# Patient Record
Sex: Female | Born: 1975
Health system: Southern US, Community
[De-identification: ages and names within clinical notes are randomized; demographics above are authoritative.]

## PROBLEM LIST (undated history)

## (undated) DIAGNOSIS — I5032 Chronic diastolic (congestive) heart failure: Secondary | ICD-10-CM

## (undated) DIAGNOSIS — I10 Essential (primary) hypertension: Secondary | ICD-10-CM

## (undated) DIAGNOSIS — E119 Type 2 diabetes mellitus without complications: Secondary | ICD-10-CM

## (undated) DIAGNOSIS — E785 Hyperlipidemia, unspecified: Secondary | ICD-10-CM

## (undated) DIAGNOSIS — D509 Iron deficiency anemia, unspecified: Secondary | ICD-10-CM

## (undated) DIAGNOSIS — I110 Hypertensive heart disease with heart failure: Secondary | ICD-10-CM

## (undated) DIAGNOSIS — N182 Chronic kidney disease, stage 2 (mild): Secondary | ICD-10-CM

## (undated) DIAGNOSIS — D573 Sickle-cell trait: Secondary | ICD-10-CM

## (undated) HISTORY — DX: Iron deficiency anemia, unspecified: D50.9

## (undated) HISTORY — DX: Morbid (severe) obesity due to excess calories: E66.01

## (undated) HISTORY — DX: Hyperlipidemia, unspecified: E78.5

## (undated) HISTORY — DX: Chronic diastolic (congestive) heart failure: I50.32

## (undated) HISTORY — DX: Chronic kidney disease, stage 2 (mild): N18.2

## (undated) HISTORY — DX: Sickle-cell trait: D57.3

## (undated) HISTORY — DX: Hypertensive heart disease with heart failure: I11.0

---

## 1998-02-13 ENCOUNTER — Emergency Department (HOSPITAL_COMMUNITY): Admission: EM | Admit: 1998-02-13 | Discharge: 1998-02-13 | Payer: Self-pay | Admitting: Emergency Medicine

## 1998-03-02 ENCOUNTER — Emergency Department (HOSPITAL_COMMUNITY): Admission: EM | Admit: 1998-03-02 | Discharge: 1998-03-02 | Payer: Self-pay | Admitting: Emergency Medicine

## 1998-03-13 ENCOUNTER — Emergency Department (HOSPITAL_COMMUNITY): Admission: EM | Admit: 1998-03-13 | Discharge: 1998-03-13 | Payer: Self-pay | Admitting: Emergency Medicine

## 1998-09-19 ENCOUNTER — Encounter: Admission: RE | Admit: 1998-09-19 | Discharge: 1998-09-19 | Payer: Self-pay | Admitting: Internal Medicine

## 1998-10-05 ENCOUNTER — Ambulatory Visit (HOSPITAL_BASED_OUTPATIENT_CLINIC_OR_DEPARTMENT_OTHER): Admission: RE | Admit: 1998-10-05 | Discharge: 1998-10-05 | Payer: Self-pay | Admitting: General Surgery

## 1998-10-11 ENCOUNTER — Emergency Department (HOSPITAL_COMMUNITY): Admission: EM | Admit: 1998-10-11 | Discharge: 1998-10-11 | Payer: Self-pay | Admitting: Emergency Medicine

## 1998-10-11 ENCOUNTER — Encounter: Payer: Self-pay | Admitting: Emergency Medicine

## 1999-04-12 ENCOUNTER — Emergency Department (HOSPITAL_COMMUNITY): Admission: EM | Admit: 1999-04-12 | Discharge: 1999-04-12 | Payer: Self-pay | Admitting: Emergency Medicine

## 1999-05-04 ENCOUNTER — Encounter (HOSPITAL_BASED_OUTPATIENT_CLINIC_OR_DEPARTMENT_OTHER): Payer: Self-pay | Admitting: General Surgery

## 1999-05-08 ENCOUNTER — Ambulatory Visit (HOSPITAL_COMMUNITY): Admission: RE | Admit: 1999-05-08 | Discharge: 1999-05-08 | Payer: Self-pay | Admitting: General Surgery

## 1999-05-16 ENCOUNTER — Encounter: Admission: RE | Admit: 1999-05-16 | Discharge: 1999-05-16 | Payer: Self-pay | Admitting: Internal Medicine

## 1999-06-19 ENCOUNTER — Encounter: Admission: RE | Admit: 1999-06-19 | Discharge: 1999-09-17 | Payer: Self-pay | Admitting: *Deleted

## 1999-07-12 ENCOUNTER — Other Ambulatory Visit: Admission: RE | Admit: 1999-07-12 | Discharge: 1999-07-12 | Payer: Self-pay | Admitting: Obstetrics

## 1999-07-13 ENCOUNTER — Encounter: Admission: RE | Admit: 1999-07-13 | Discharge: 1999-07-13 | Payer: Self-pay | Admitting: Hematology and Oncology

## 2000-08-14 ENCOUNTER — Encounter: Admission: RE | Admit: 2000-08-14 | Discharge: 2000-08-14 | Payer: Self-pay | Admitting: Hematology and Oncology

## 2000-10-09 ENCOUNTER — Encounter: Admission: RE | Admit: 2000-10-09 | Discharge: 2000-10-09 | Payer: Self-pay | Admitting: Hematology and Oncology

## 2000-10-23 ENCOUNTER — Encounter: Admission: RE | Admit: 2000-10-23 | Discharge: 2001-01-21 | Payer: Self-pay | Admitting: *Deleted

## 2000-11-14 ENCOUNTER — Encounter: Payer: Self-pay | Admitting: Emergency Medicine

## 2000-11-14 ENCOUNTER — Emergency Department (HOSPITAL_COMMUNITY): Admission: EM | Admit: 2000-11-14 | Discharge: 2000-11-14 | Payer: Self-pay | Admitting: Emergency Medicine

## 2000-12-11 ENCOUNTER — Other Ambulatory Visit: Admission: RE | Admit: 2000-12-11 | Discharge: 2000-12-11 | Payer: Self-pay | Admitting: Obstetrics

## 2000-12-23 ENCOUNTER — Emergency Department (HOSPITAL_COMMUNITY): Admission: EM | Admit: 2000-12-23 | Discharge: 2000-12-23 | Payer: Self-pay | Admitting: Emergency Medicine

## 2001-03-18 ENCOUNTER — Encounter: Admission: RE | Admit: 2001-03-18 | Discharge: 2001-03-18 | Payer: Self-pay | Admitting: Internal Medicine

## 2001-05-14 ENCOUNTER — Encounter: Admission: RE | Admit: 2001-05-14 | Discharge: 2001-05-14 | Payer: Self-pay | Admitting: Internal Medicine

## 2001-08-20 ENCOUNTER — Encounter: Admission: RE | Admit: 2001-08-20 | Discharge: 2001-08-20 | Payer: Self-pay | Admitting: Internal Medicine

## 2001-08-20 ENCOUNTER — Encounter: Payer: Self-pay | Admitting: Internal Medicine

## 2001-08-20 ENCOUNTER — Ambulatory Visit (HOSPITAL_COMMUNITY): Admission: RE | Admit: 2001-08-20 | Discharge: 2001-08-20 | Payer: Self-pay | Admitting: Internal Medicine

## 2001-09-15 ENCOUNTER — Encounter: Admission: RE | Admit: 2001-09-15 | Discharge: 2001-09-15 | Payer: Self-pay

## 2001-11-16 ENCOUNTER — Encounter: Payer: Self-pay | Admitting: Emergency Medicine

## 2001-11-16 ENCOUNTER — Emergency Department (HOSPITAL_COMMUNITY): Admission: EM | Admit: 2001-11-16 | Discharge: 2001-11-16 | Payer: Self-pay | Admitting: Emergency Medicine

## 2002-01-05 ENCOUNTER — Encounter: Admission: RE | Admit: 2002-01-05 | Discharge: 2002-01-05 | Payer: Self-pay

## 2002-01-05 ENCOUNTER — Ambulatory Visit (HOSPITAL_COMMUNITY): Admission: RE | Admit: 2002-01-05 | Discharge: 2002-01-05 | Payer: Self-pay

## 2002-01-05 ENCOUNTER — Encounter: Payer: Self-pay | Admitting: Internal Medicine

## 2002-01-14 ENCOUNTER — Encounter: Admission: RE | Admit: 2002-01-14 | Discharge: 2002-01-14 | Payer: Self-pay | Admitting: Internal Medicine

## 2003-02-21 ENCOUNTER — Emergency Department (HOSPITAL_COMMUNITY): Admission: EM | Admit: 2003-02-21 | Discharge: 2003-02-22 | Payer: Self-pay | Admitting: Emergency Medicine

## 2003-02-22 ENCOUNTER — Encounter: Payer: Self-pay | Admitting: Emergency Medicine

## 2003-11-22 ENCOUNTER — Emergency Department (HOSPITAL_COMMUNITY): Admission: AD | Admit: 2003-11-22 | Discharge: 2003-11-22 | Payer: Self-pay | Admitting: Family Medicine

## 2004-03-21 ENCOUNTER — Emergency Department (HOSPITAL_COMMUNITY): Admission: EM | Admit: 2004-03-21 | Discharge: 2004-03-21 | Payer: Self-pay | Admitting: Emergency Medicine

## 2004-08-22 ENCOUNTER — Emergency Department (HOSPITAL_COMMUNITY): Admission: EM | Admit: 2004-08-22 | Discharge: 2004-08-22 | Payer: Self-pay | Admitting: Family Medicine

## 2004-10-14 ENCOUNTER — Emergency Department (HOSPITAL_COMMUNITY): Admission: EM | Admit: 2004-10-14 | Discharge: 2004-10-14 | Payer: Self-pay | Admitting: Family Medicine

## 2010-05-23 ENCOUNTER — Inpatient Hospital Stay (HOSPITAL_COMMUNITY): Admission: EM | Admit: 2010-05-23 | Discharge: 2010-05-26 | Payer: Self-pay | Admitting: Emergency Medicine

## 2010-11-23 LAB — GLUCOSE, CAPILLARY
Glucose-Capillary: 338 mg/dL — ABNORMAL HIGH (ref 70–99)
Glucose-Capillary: 110 mg/dL — ABNORMAL HIGH (ref 70–99)
Glucose-Capillary: 148 mg/dL — ABNORMAL HIGH (ref 70–99)
Glucose-Capillary: 156 mg/dL — ABNORMAL HIGH (ref 70–99)
Glucose-Capillary: 161 mg/dL — ABNORMAL HIGH (ref 70–99)
Glucose-Capillary: 185 mg/dL — ABNORMAL HIGH (ref 70–99)
Glucose-Capillary: 190 mg/dL — ABNORMAL HIGH (ref 70–99)
Glucose-Capillary: 214 mg/dL — ABNORMAL HIGH (ref 70–99)
Glucose-Capillary: 220 mg/dL — ABNORMAL HIGH (ref 70–99)
Glucose-Capillary: 265 mg/dL — ABNORMAL HIGH (ref 70–99)
Glucose-Capillary: 276 mg/dL — ABNORMAL HIGH (ref 70–99)
Glucose-Capillary: 278 mg/dL — ABNORMAL HIGH (ref 70–99)
Glucose-Capillary: 330 mg/dL — ABNORMAL HIGH (ref 70–99)
Glucose-Capillary: 346 mg/dL — ABNORMAL HIGH (ref 70–99)
Glucose-Capillary: 411 mg/dL — ABNORMAL HIGH (ref 70–99)
Glucose-Capillary: 420 mg/dL — ABNORMAL HIGH (ref 70–99)

## 2010-11-23 LAB — URINALYSIS, ROUTINE W REFLEX MICROSCOPIC

## 2010-11-23 LAB — CBC
HCT: 32.5 % — ABNORMAL LOW (ref 36.0–46.0)
Hemoglobin: 11.2 g/dL — ABNORMAL LOW (ref 12.0–15.0)
MCH: 23.3 pg — ABNORMAL LOW (ref 26.0–34.0)
MCH: 23.5 pg — ABNORMAL LOW (ref 26.0–34.0)
MCH: 23.9 pg — ABNORMAL LOW (ref 26.0–34.0)
MCH: 24.7 pg — ABNORMAL LOW (ref 26.0–34.0)
MCHC: 32.7 g/dL (ref 30.0–36.0)
MCHC: 32.9 g/dL (ref 30.0–36.0)
MCHC: 34.5 g/dL (ref 30.0–36.0)
MCV: 71.3 fL — ABNORMAL LOW (ref 78.0–100.0)
MCV: 71.6 fL — ABNORMAL LOW (ref 78.0–100.0)
Platelets: 182 10*3/uL (ref 150–400)
Platelets: 190 10*3/uL (ref 150–400)
Platelets: 165 K/uL (ref 150–400)
Platelets: 197 10*3/uL (ref 150–400)
RBC: 4.45 MIL/uL (ref 3.87–5.11)
RBC: 4.49 MIL/uL (ref 3.87–5.11)
RBC: 4.54 MIL/uL (ref 3.87–5.11)
RBC: 4.93 MIL/uL (ref 3.87–5.11)
RDW: 17.2 % — ABNORMAL HIGH (ref 11.5–15.5)
RDW: 17.3 % — ABNORMAL HIGH (ref 11.5–15.5)
RDW: 15.7 % — ABNORMAL HIGH (ref 11.5–15.5)
RDW: 16.5 % — ABNORMAL HIGH (ref 11.5–15.5)
WBC: 20.1 K/uL — ABNORMAL HIGH (ref 4.0–10.5)

## 2010-11-23 LAB — DIFFERENTIAL
Basophils Absolute: 0 10*3/uL (ref 0.0–0.1)
Basophils Absolute: 0 10*3/uL (ref 0.0–0.1)
Basophils Absolute: 0 K/uL (ref 0.0–0.1)
Basophils Relative: 0 % (ref 0–1)
Basophils Relative: 0 % (ref 0–1)
Eosinophils Absolute: 0.1 10*3/uL (ref 0.0–0.7)
Eosinophils Absolute: 0 10*3/uL (ref 0.0–0.7)
Eosinophils Absolute: 0 K/uL (ref 0.0–0.7)
Eosinophils Relative: 0 % (ref 0–5)
Eosinophils Relative: 0 % (ref 0–5)
Lymphocytes Relative: 10 % — ABNORMAL LOW (ref 12–46)
Lymphocytes Relative: 15 % (ref 12–46)
Lymphs Abs: 4.4 10*3/uL — ABNORMAL HIGH (ref 0.7–4.0)
Lymphs Abs: 3 K/uL (ref 0.7–4.0)
Monocytes Absolute: 1.4 10*3/uL — ABNORMAL HIGH (ref 0.1–1.0)
Monocytes Absolute: 1.8 K/uL — ABNORMAL HIGH (ref 0.1–1.0)
Monocytes Relative: 9 % (ref 3–12)
Neutro Abs: 5.1 10*3/uL (ref 1.7–7.7)
Neutro Abs: 15.3 K/uL — ABNORMAL HIGH (ref 1.7–7.7)
Neutro Abs: 20 10*3/uL — ABNORMAL HIGH (ref 1.7–7.7)
Neutrophils Relative %: 47 % (ref 43–77)
Neutrophils Relative %: 76 % (ref 43–77)
Neutrophils Relative %: 84 % — ABNORMAL HIGH (ref 43–77)

## 2010-11-23 LAB — COMPREHENSIVE METABOLIC PANEL
Alkaline Phosphatase: 82 U/L (ref 39–117)
BUN: 8 mg/dL (ref 6–23)
Chloride: 109 mEq/L (ref 96–112)
Creatinine, Ser: 0.73 mg/dL (ref 0.4–1.2)
GFR calc non Af Amer: >60 mL/min (ref >60)
Glucose, Bld: 108 mg/dL — ABNORMAL HIGH (ref 70–99)
Potassium: 3.2 mEq/L — ABNORMAL LOW (ref 3.5–5.1)
Total Bilirubin: 0.4 mg/dL (ref 0.3–1.2)

## 2010-11-23 LAB — HEMOGLOBIN A1C
Hgb A1c MFr Bld: 10.3 % — ABNORMAL HIGH (ref <5.7)
Mean Plasma Glucose: 249 mg/dL — ABNORMAL HIGH (ref <117)

## 2010-11-23 LAB — BASIC METABOLIC PANEL
BUN: 6 mg/dL (ref 6–23)
BUN: 8 mg/dL (ref 6–23)
CO2: 27 mEq/L (ref 19–32)
CO2: 26 mEq/L (ref 19–32)
Calcium: 8.5 mg/dL (ref 8.4–10.5)
Calcium: 8.7 mg/dL (ref 8.4–10.5)
Calcium: 8.8 mg/dL (ref 8.4–10.5)
Chloride: 106 mEq/L (ref 96–112)
Chloride: 107 mEq/L (ref 96–112)
Creatinine, Ser: 0.62 mg/dL (ref 0.4–1.2)
Creatinine, Ser: 0.81 mg/dL (ref 0.4–1.2)
GFR calc non Af Amer: >60 mL/min (ref >60)
GFR calc non Af Amer: >60 mL/min (ref >60)
GFR calc non Af Amer: >60 mL/min (ref >60)
Glucose, Bld: 259 mg/dL — ABNORMAL HIGH (ref 70–99)
Glucose, Bld: 310 mg/dL — ABNORMAL HIGH (ref 70–99)
Potassium: 3.4 mEq/L — ABNORMAL LOW (ref 3.5–5.1)
Potassium: 4 mEq/L (ref 3.5–5.1)
Potassium: 3.9 mEq/L (ref 3.5–5.1)
Sodium: 139 mEq/L (ref 135–145)
Sodium: 140 mEq/L (ref 135–145)

## 2010-11-23 LAB — LIPID PANEL
Cholesterol: 123 mg/dL (ref 0–200)
HDL: 32 mg/dL — ABNORMAL LOW (ref >39)
LDL Cholesterol: 73 mg/dL (ref 0–99)
Total CHOL/HDL Ratio: 3.8 ratio
Triglycerides: 91 mg/dL (ref <150)
VLDL: 18 mg/dL (ref 0–40)

## 2010-11-23 LAB — CULTURE, BLOOD (ROUTINE X 2)
Culture  Setup Time: 201109140828
Culture  Setup Time: 201109140828
Culture: NO GROWTH

## 2010-11-23 LAB — URINE MICROSCOPIC-ADD ON

## 2010-11-23 LAB — PREGNANCY, URINE: Preg Test, Ur: NEGATIVE

## 2010-11-23 LAB — POCT I-STAT, CHEM 8
BUN: 10 mg/dL (ref 6–23)
Calcium, Ion: 1.17 mmol/L (ref 1.12–1.32)
Chloride: 99 mEq/L (ref 96–112)
Creatinine, Ser: 0.7 mg/dL (ref 0.4–1.2)
Potassium: 3.7 mEq/L (ref 3.5–5.1)

## 2010-11-23 LAB — URINE CULTURE: Colony Count: >=100000

## 2011-08-27 ENCOUNTER — Ambulatory Visit: Payer: Self-pay

## 2011-08-27 DIAGNOSIS — IMO0001 Reserved for inherently not codable concepts without codable children: Secondary | ICD-10-CM

## 2014-01-27 ENCOUNTER — Emergency Department (HOSPITAL_COMMUNITY)
Admission: EM | Admit: 2014-01-27 | Discharge: 2014-01-27 | Disposition: A | Discharge status: Home / Self Care | Payer: Self-pay | Attending: Emergency Medicine | Admitting: Emergency Medicine

## 2014-01-27 ENCOUNTER — Encounter (HOSPITAL_COMMUNITY): Payer: Self-pay | Admitting: Emergency Medicine

## 2014-01-27 DIAGNOSIS — Z9119 Patient's noncompliance with other medical treatment and regimen: Secondary | ICD-10-CM | POA: Insufficient documentation

## 2014-01-27 DIAGNOSIS — I10 Essential (primary) hypertension: Secondary | ICD-10-CM | POA: Insufficient documentation

## 2014-01-27 DIAGNOSIS — E119 Type 2 diabetes mellitus without complications: Secondary | ICD-10-CM | POA: Insufficient documentation

## 2014-01-27 DIAGNOSIS — Z91199 Patient's noncompliance with other medical treatment and regimen due to unspecified reason: Secondary | ICD-10-CM | POA: Insufficient documentation

## 2014-01-27 DIAGNOSIS — H538 Other visual disturbances: Secondary | ICD-10-CM | POA: Insufficient documentation

## 2014-01-27 HISTORY — DX: Essential (primary) hypertension: I10

## 2014-01-27 HISTORY — DX: Type 2 diabetes mellitus without complications: E11.9

## 2014-01-27 LAB — BASIC METABOLIC PANEL
BUN: 11 mg/dL (ref 6–23)
CALCIUM: 9.5 mg/dL (ref 8.4–10.5)
CO2: 21 mEq/L (ref 19–32)
Chloride: 97 mEq/L (ref 96–112)
Creatinine, Ser: 0.87 mg/dL (ref 0.50–1.10)
GFR calc Af Amer: >90 mL/min (ref >90)
GFR, EST NON AFRICAN AMERICAN: 83 mL/min — AB (ref >90)
Glucose, Bld: 294 mg/dL — ABNORMAL HIGH (ref 70–99)
Potassium: 4.4 mEq/L (ref 3.7–5.3)
Sodium: 135 mEq/L — ABNORMAL LOW (ref 137–147)

## 2014-01-27 LAB — CBC WITH DIFFERENTIAL/PLATELET
BASOS ABS: 0 10*3/uL (ref 0.0–0.1)
Basophils Relative: 0 % (ref 0–1)
EOS PCT: 1 % (ref 0–5)
Eosinophils Absolute: 0.1 10*3/uL (ref 0.0–0.7)
HCT: 37 % (ref 36.0–46.0)
Hemoglobin: 12.3 g/dL (ref 12.0–15.0)
LYMPHS ABS: 4.4 10*3/uL — AB (ref 0.7–4.0)
LYMPHS PCT: 33 % (ref 12–46)
MCH: 23.1 pg — ABNORMAL LOW (ref 26.0–34.0)
MCHC: 33.2 g/dL (ref 30.0–36.0)
MCV: 69.4 fL — AB (ref 78.0–100.0)
MONOS PCT: 5 % (ref 3–12)
Monocytes Absolute: 0.7 10*3/uL (ref 0.1–1.0)
NEUTROS PCT: 61 % (ref 43–77)
Neutro Abs: 8.1 10*3/uL — ABNORMAL HIGH (ref 1.7–7.7)
PLATELETS: 213 10*3/uL (ref 150–400)
RBC: 5.33 MIL/uL — AB (ref 3.87–5.11)
RDW: 16.1 % — AB (ref 11.5–15.5)
WBC: 13.3 10*3/uL — AB (ref 4.0–10.5)

## 2014-01-27 LAB — CBG MONITORING, ED: Glucose-Capillary: 257 mg/dL — ABNORMAL HIGH (ref 70–99)

## 2014-01-27 LAB — URINE MICROSCOPIC-ADD ON

## 2014-01-27 LAB — URINALYSIS, ROUTINE W REFLEX MICROSCOPIC
BILIRUBIN URINE: NEGATIVE
Glucose, UA: 500 mg/dL — AB
KETONES UR: NEGATIVE mg/dL
Leukocytes, UA: NEGATIVE
Nitrite: NEGATIVE
Specific Gravity, Urine: 1.012 (ref 1.005–1.030)
Urobilinogen, UA: 0.2 mg/dL (ref 0.0–1.0)
pH: 7 (ref 5.0–8.0)

## 2014-01-27 MED ORDER — HYDROCHLOROTHIAZIDE 25 MG PO TABS
25.0000 mg | ORAL_TABLET | Freq: Once | ORAL | Status: AC
  Administered 2014-01-27: 25 mg via ORAL
  Filled 2014-01-27: qty 1

## 2014-01-27 MED ORDER — METFORMIN HCL 500 MG PO TABS: 500.0000 mg | ORAL_TABLET | Freq: Two times a day (BID) | ORAL | Status: DC

## 2014-01-27 MED ORDER — LISINOPRIL 10 MG PO TABS
10.0000 mg | ORAL_TABLET | Freq: Once | ORAL | Status: AC
  Administered 2014-01-27: 10 mg via ORAL
  Filled 2014-01-27: qty 1

## 2014-01-27 MED ORDER — HYDROCHLOROTHIAZIDE 25 MG PO TABS: 25.0000 mg | ORAL_TABLET | Freq: Once | ORAL | Status: DC

## 2014-01-27 MED ORDER — LISINOPRIL 10 MG PO TABS: 10.0000 mg | ORAL_TABLET | Freq: Once | ORAL | Status: DC

## 2014-01-27 MED ORDER — HYDROCHLOROTHIAZIDE 12.5 MG PO CAPS
25.0000 mg | ORAL_CAPSULE | Freq: Once | ORAL | Status: DC
  Filled 2014-01-27 (×2): qty 2

## 2014-01-27 NOTE — ED Notes (Signed)
Pt states that she is diabetic but has not had her medicine in a year.  Would like her sugar checked.  Also c/o a nontender knot on her scalp x 2 days.

## 2014-01-27 NOTE — ED Notes (Signed)
Pt has a small knot on her head that she states popped up a couple of weeks ago. Pt denies any pain from the knot. PA aware.

## 2014-01-27 NOTE — Discharge Instructions (Signed)
Please read and follow all provided instructions.  Your diagnoses today include:  1. Hypertension   2. Diabetes mellitus     Your blood pressure was high today (BP): BP 189/105   Pulse 126   Temp(Src) 98.1 F (36.7 C) (Oral)   Resp 20   SpO2 100%   LMP 01/25/2014  Tests performed today include:  Vital signs. See below for your results today.   Medications prescribed:   HCTZ - medication for blood pressure   Lisinopril - medication for blood pressure   Metformin - medication for blood sugar  Home care instructions:  Follow any educational materials contained in this packet.  Follow-up instructions: Please follow-up with your primary care provider in the next 3 days for a recheck of your symptoms and blood pressure.    If you do not have a primary care doctor -- see below for referral information.   Return instructions:   Please return to the Emergency Department if you experience worsening symptoms.   Return with severe chest pain, abdominal pain, or shortness of breath.   Return with severe headache, focal weakness, numbness, difficulty with speech or vision.  Return with loss of vision or sudden vision change.  Please return if you have any other emergent concerns.  Additional Information:  Your vital signs today were: BP 189/105   Pulse 126   Temp(Src) 98.1 F (36.7 C) (Oral)   Resp 20   SpO2 100%   LMP 01/25/2014 If your blood pressure (BP) was elevated above 135/85 this visit, please have this repeated by your doctor within one month. --------------

## 2014-01-27 NOTE — ED Provider Notes (Signed)
Medical screening examination/treatment/procedure(s) were performed by non-physician practitioner and as supervising physician I was immediately available for consultation/collaboration.   EKG Interpretation None       Kristen N Ward, DO 01/27/14 2325 

## 2014-01-27 NOTE — ED Provider Notes (Signed)
CSN: [id number]     Arrival date & time 01/27/14  1731 History   First MD Initiated Contact with Patient 01/27/14 1823     Chief Complaint  Patient presents with  . Headache     (Consider location/radiation/quality/duration/timing/severity/associated sxs/prior Treatment) HPI Comments: Patient with history of hypertension and diabetes, noncompliant with her medications in over one year -- presents with several day history of "feeling out of sorts". She is unable to describe exactly how she is feeling. She denies headache. Patient denies signs of stroke including: facial droop, slurred speech, aphasia, weakness/numbness in extremities, imbalance/trouble walking. Patient has had visual disturbance and left lateral field at times however this is not currently present. She does not describe this as a loss of vision. She denies chest pain or shortness of breath. She denies change in urination. No treatments prior to arrival.  Patient used to take HCTZ, lisinopril, and amlodipine for blood pressure. She is to take metformin and glipizide for diabetes.  Patient also presents with complaint of a nontender "knot" of her left scalp for the past 2 weeks. No drainage or warmth.   The history is provided by the patient.    Past Medical History  Diagnosis Date  . Diabetes mellitus without complication   . Hypertension    History reviewed. No pertinent past surgical history. History reviewed. No pertinent family history. History  Substance Use Topics  . Smoking status: Never Smoker   . Smokeless tobacco: Not on file  . Alcohol Use: No   OB History   Grav Para Term Preterm Abortions TAB SAB Ect Mult Living                 Review of Systems  Constitutional: Negative for fever.  HENT: Negative for rhinorrhea and sore throat.   Eyes: Positive for visual disturbance (intermittent). Negative for photophobia and redness.  Respiratory: Negative for cough and shortness of breath.   Cardiovascular:  Negative for chest pain.  Gastrointestinal: Negative for nausea, vomiting, abdominal pain and diarrhea.  Genitourinary: Negative for dysuria.  Musculoskeletal: Negative for myalgias.  Skin: Negative for rash.       Knot on scalp.   Neurological: Negative for headaches.      Allergies  Review of patient's allergies indicates no known allergies.  Home Medications   Prior to Admission medications   Not on File   BP 240/142  Pulse 126  Temp(Src) 98.1 F (36.7 C) (Oral)  Resp 18  SpO2 100%  LMP 01/25/2014 Physical Exam  Nursing note and vitals reviewed. Constitutional: She is oriented to person, place, and time. She appears well-developed and well-nourished.  HENT:  Head: Normocephalic and atraumatic.  Right Ear: Tympanic membrane, external ear and ear canal normal.  Left Ear: Tympanic membrane, external ear and ear canal normal.  Nose: Nose normal.  Mouth/Throat: Uvula is midline, oropharynx is clear and moist and mucous membranes are normal.  Patient with 1 cm nodule of, left upper scalp consistent with cyst, likely a sebaceous cyst. No associated infection. Area is nontender  Eyes: Conjunctivae, EOM and lids are normal. Pupils are equal, round, and reactive to light. Right eye exhibits no discharge. Left eye exhibits no discharge. Right eye exhibits no nystagmus. Left eye exhibits no nystagmus.  Fundoscopic exam:      The right eye shows no papilledema.       The left eye shows no papilledema.  Neck: Normal range of motion. Neck supple.  Cardiovascular: Normal rate, regular rhythm and normal  heart sounds.   Pulmonary/Chest: Effort normal and breath sounds normal. No respiratory distress. She has no wheezes. She has no rales.  Abdominal: Soft. There is no tenderness.  Musculoskeletal:       Cervical back: She exhibits normal range of motion, no tenderness and no bony tenderness.  Neurological: She is alert and oriented to person, place, and time. She has normal strength and  normal reflexes. No cranial nerve deficit or sensory deficit. She displays a negative Romberg sign. Coordination and gait normal. GCS eye subscore is 4. GCS verbal subscore is 5. GCS motor subscore is 6.  Skin: Skin is warm and dry.  Psychiatric: She has a normal mood and affect.    ED Course  Procedures (including critical care time) Labs Review Labs Reviewed  CBC WITH DIFFERENTIAL - Abnormal; Notable for the following:    WBC 13.3 (*)    RBC 5.33 (*)    MCV 69.4 (*)    MCH 23.1 (*)    RDW 16.1 (*)    Neutro Abs 8.1 (*)    Lymphs Abs 4.4 (*)    All other components within normal limits  BASIC METABOLIC PANEL - Abnormal; Notable for the following:    Sodium 135 (*)    Glucose, Bld 294 (*)    GFR calc non Af Amer 83 (*)    All other components within normal limits  URINALYSIS, ROUTINE W REFLEX MICROSCOPIC - Abnormal; Notable for the following:    Glucose, UA 500 (*)    Hgb urine dipstick SMALL (*)    Protein, ur >300 (*)    All other components within normal limits  CBG MONITORING, ED - Abnormal; Notable for the following:    Glucose-Capillary 257 (*)    All other components within normal limits  URINE MICROSCOPIC-ADD ON    Imaging Review No results found.   EKG Interpretation None      6:29 PM Patient seen and examined. Work-up initiated. Medications ordered. D/w Dr. Elesa MassedWard.   Vital signs reviewed and are as follows: Filed Vitals:   01/27/14 1822  BP: 240/142  Pulse: 126  Temp: 98.1 F (36.7 C)  Resp: 18   8:29 PM BP improved after PO meds. Patient is stable. Results reviewed with patient including glucose, protein in urine, normal creatinine.   BP 189/105  Pulse 126  Temp(Src) 98.1 F (36.7 C) (Oral)  Resp 20  SpO2 100%  LMP 01/25/2014  HR was 95 on monitor when I went to tell her her results and did not go above 105.   She will be discharged to home. Told to f/u with [facility].   She is to restart lisinopril and HCTZ, metformin.  Her PCP will titrate these medications.   Patient counseled to return if they have weakness in their arms or legs, slurred speech, trouble walking or talking, confusion, trouble with their balance, or if they have any other concerns. Patient verbalizes understanding and agrees with plan. Told to return with vision change/loss, CP, SOB.     MDM   Final diagnoses:  Hypertension  Diabetes mellitus   HTN: no signs of end organ damage. BP improved into 180's. HR improved -- suspect element of anxiety given patient appearance. Good response from PO meds. Will restart and have patient f/u for titration. Protein in urine is not great prognostic factor.   DM: hyperglycemia without ketosis. Restart metformin. No further treatment at this time. Again, pt to f/u for management.  No dangerous or life-threatening conditions suspected or identified by history, physical exam, and by work-up. No indications for hospitalization identified.      Renne Crigler, PA-C 01/27/14 2034

## 2014-01-28 NOTE — Progress Notes (Signed)
Received phone call from Dois DavenportSandra, Database administratorpharmacist tech, at [facility] and she requested discharge medication prescription clarification. Provided an EPIC read. No further questions or concerns. Ferdinand CavaAndrea Schettino, RN, BSN, Case Managers 01/28/2014 10:28 AM

## 2014-03-29 ENCOUNTER — Ambulatory Visit (INDEPENDENT_AMBULATORY_CARE_PROVIDER_SITE_OTHER): Payer: Self-pay | Admitting: Family Medicine

## 2014-03-29 VITALS — BP 180/110 | HR 116 | Temp 98.1°F | Resp 16 | Ht 63.5 in | Wt 295.0 lb

## 2014-03-29 DIAGNOSIS — E1165 Type 2 diabetes mellitus with hyperglycemia: Secondary | ICD-10-CM

## 2014-03-29 DIAGNOSIS — E119 Type 2 diabetes mellitus without complications: Secondary | ICD-10-CM

## 2014-03-29 DIAGNOSIS — I10 Essential (primary) hypertension: Secondary | ICD-10-CM

## 2014-03-29 LAB — COMPREHENSIVE METABOLIC PANEL
ALT: 8 U/L (ref 0–35)
AST: 9 U/L (ref 0–37)
Albumin: 3.4 g/dL — ABNORMAL LOW (ref 3.5–5.2)
Alkaline Phosphatase: 79 U/L (ref 39–117)
BILIRUBIN TOTAL: 0.3 mg/dL (ref 0.2–1.2)
BUN: 18 mg/dL (ref 6–23)
CHLORIDE: 102 meq/L (ref 96–112)
CO2: 25 mEq/L (ref 19–32)
Calcium: 9.1 mg/dL (ref 8.4–10.5)
Creat: 0.96 mg/dL (ref 0.50–1.10)
Glucose, Bld: 322 mg/dL — ABNORMAL HIGH (ref 70–99)
Potassium: 4.3 mEq/L (ref 3.5–5.3)
SODIUM: 138 meq/L (ref 135–145)
TOTAL PROTEIN: 6.9 g/dL (ref 6.0–8.3)

## 2014-03-29 LAB — GLUCOSE, POCT (MANUAL RESULT ENTRY): POC Glucose: 315 mg/dl — AB (ref 70–99)

## 2014-03-29 LAB — POCT GLYCOSYLATED HEMOGLOBIN (HGB A1C): HEMOGLOBIN A1C: 11.1

## 2014-03-29 MED ORDER — METFORMIN HCL 1000 MG PO TABS: 1000.0000 mg | ORAL_TABLET | Freq: Two times a day (BID) | ORAL | Status: DC

## 2014-03-29 MED ORDER — HYDROCHLOROTHIAZIDE 25 MG PO TABS: 25.0000 mg | ORAL_TABLET | Freq: Once | ORAL | Status: DC

## 2014-03-29 MED ORDER — LISINOPRIL 20 MG PO TABS: 20.0000 mg | ORAL_TABLET | Freq: Every day | ORAL | Status: DC

## 2014-03-29 NOTE — Patient Instructions (Signed)
Increase lisinopril to 20 mg by mouth daily- can take two 10 mg tablets until you run out Please check your blood pressure daily  Continue to make good food choices- no juice or soda, drink unsweetened tea, water, increase lean protein and vegetable intake. Drink 8 glasses of water a day Move more- even 10 minutes of walking a day will decrease your blood sugar. Try to exercise at least every other day to help lower blood sugar.

## 2014-03-29 NOTE — Progress Notes (Signed)
   Subjective:    Patient ID: Brittany Lopez, female    DOB: 06/12/1976, 38 y.o.   MRN: [id number]  HPI This is a very pleasant 38 yo female who is accompanied by her 619 yo daughter today. The patient presents today for follow up of HTN/DM. Was seen in ER 5/15 and restarted on glucophage/lisinopril. Prior to that, was not taking any medication. She admits to trying to manage her HTN/DM "on my own," without medications, but has realized that is not feasible at her current weight. She has been out of her medication for about a week.  She saw Dr. Merla Richesoolittle in the past, but has not had regular care in quite awhile. She does not have health insurance. She is currently participating in a "wellness program," and is trying to eat better. She has an automatic blood pressure cuff at home and checks her BP regularly. She reports systolic readings in the 150s and diastolic readings in the 80s. She did not take her medication this morning.   She has a glucometer at home, but has not been checking her blood sugar.  Review of Systems No cp, no SOB, no urinary frequency, no polydipsia, no polypahgia     Objective:   Physical Exam  Vitals reviewed. Constitutional: She is oriented to person, place, and time. She appears well-developed and well-nourished.  HENT:  Head: Normocephalic and atraumatic.  Right Ear: External ear normal.  Left Ear: External ear normal.  Nose: Nose normal.  Mouth/Throat: Oropharynx is clear and moist.  Eyes: Conjunctivae are normal. Right eye exhibits no discharge. Left eye exhibits no discharge.  Neck: Normal range of motion. Neck supple.  Cardiovascular: Normal rate, regular rhythm and normal heart sounds.   Pulmonary/Chest: Effort normal and breath sounds normal.  Musculoskeletal: Normal range of motion.  Neurological: She is alert and oriented to person, place, and time.  Skin: Skin is warm and dry.  Psychiatric: She has a normal mood and affect. Her behavior is normal.  Judgment and thought content normal.      Assessment & Plan:  1. Essential hypertension - Comprehensive metabolic panel - hydrochlorothiazide (HYDRODIURIL) 25 MG tablet; Take 1 tablet (25 mg total) by mouth once.  Dispense: 30 tablet; Refill: 1 - lisinopril (PRINIVIL,ZESTRIL) 20 MG tablet; Take 1 tablet (20 mg total) by mouth daily.  Dispense: 30 tablet; Refill: 3  2. Type 2 diabetes mellitus with hyperglycemia - POCT glucose (manual entry) - POCT glycosylated hemoglobin (Hb A1C) - Comprehensive metabolic panel - metFORMIN (GLUCOPHAGE) 1000 MG tablet; Take 1 tablet (1,000 mg total) by mouth 2 (two) times daily with a meal.  Dispense: 60 tablet; Refill: 3 -The patient will start checking her blood sugar at home and bring in a log at her next visit -The patient is currently feeling motivated to exercise more and eat better- provided written and verbal encouragement as well as some basic dietary guidelies. -She will follow up for brief visit in 1 month- goal weight loss 4 pounds, she is to bring in BP and glucose readings  -Discussed DM/HTN with patient. Patient is uninsured and I offered to help her find resources where she could be seen at lower cost. The patient wishes to continue to be seen here as self pay because she feels strongly about the quality of care she receives here.   Emi Belfasteborah B. Coltrane Tugwell, FNP-BC  [facility], [facility]  03/30/2014 11:01 AM

## 2014-03-30 DIAGNOSIS — E119 Type 2 diabetes mellitus without complications: Secondary | ICD-10-CM | POA: Insufficient documentation

## 2014-03-30 DIAGNOSIS — Z794 Long term (current) use of insulin: Secondary | ICD-10-CM

## 2014-03-30 DIAGNOSIS — I10 Essential (primary) hypertension: Secondary | ICD-10-CM | POA: Insufficient documentation

## 2014-05-30 ENCOUNTER — Emergency Department (HOSPITAL_COMMUNITY): Payer: Self-pay

## 2014-05-30 ENCOUNTER — Inpatient Hospital Stay (HOSPITAL_COMMUNITY)
Admission: EM | Admit: 2014-05-30 | Discharge: 2014-06-04 | DRG: 189 | Disposition: A | Discharge status: Home / Self Care | Payer: Self-pay | Attending: Internal Medicine | Admitting: Internal Medicine

## 2014-05-30 ENCOUNTER — Encounter (HOSPITAL_COMMUNITY): Payer: Self-pay | Admitting: Emergency Medicine

## 2014-05-30 DIAGNOSIS — R0902 Hypoxemia: Secondary | ICD-10-CM

## 2014-05-30 DIAGNOSIS — J810 Acute pulmonary edema: Secondary | ICD-10-CM

## 2014-05-30 DIAGNOSIS — J9601 Acute respiratory failure with hypoxia: Secondary | ICD-10-CM | POA: Diagnosis present

## 2014-05-30 DIAGNOSIS — R000 Tachycardia, unspecified: Secondary | ICD-10-CM

## 2014-05-30 DIAGNOSIS — Z6841 Body Mass Index (BMI) 40.0 and over, adult: Secondary | ICD-10-CM

## 2014-05-30 DIAGNOSIS — J9600 Acute respiratory failure, unspecified whether with hypoxia or hypercapnia: Principal | ICD-10-CM | POA: Diagnosis present

## 2014-05-30 DIAGNOSIS — I5033 Acute on chronic diastolic (congestive) heart failure: Secondary | ICD-10-CM | POA: Diagnosis present

## 2014-05-30 DIAGNOSIS — D72829 Elevated white blood cell count, unspecified: Secondary | ICD-10-CM | POA: Diagnosis present

## 2014-05-30 DIAGNOSIS — E1165 Type 2 diabetes mellitus with hyperglycemia: Secondary | ICD-10-CM

## 2014-05-30 DIAGNOSIS — E860 Dehydration: Secondary | ICD-10-CM | POA: Diagnosis present

## 2014-05-30 DIAGNOSIS — E119 Type 2 diabetes mellitus without complications: Secondary | ICD-10-CM | POA: Diagnosis present

## 2014-05-30 DIAGNOSIS — D649 Anemia, unspecified: Secondary | ICD-10-CM | POA: Diagnosis present

## 2014-05-30 DIAGNOSIS — I10 Essential (primary) hypertension: Secondary | ICD-10-CM

## 2014-05-30 DIAGNOSIS — N179 Acute kidney failure, unspecified: Secondary | ICD-10-CM | POA: Diagnosis present

## 2014-05-30 DIAGNOSIS — I509 Heart failure, unspecified: Secondary | ICD-10-CM | POA: Diagnosis present

## 2014-05-30 DIAGNOSIS — I5031 Acute diastolic (congestive) heart failure: Secondary | ICD-10-CM

## 2014-05-30 DIAGNOSIS — R197 Diarrhea, unspecified: Secondary | ICD-10-CM | POA: Diagnosis present

## 2014-05-30 DIAGNOSIS — IMO0001 Reserved for inherently not codable concepts without codable children: Secondary | ICD-10-CM | POA: Diagnosis present

## 2014-05-30 DIAGNOSIS — Z794 Long term (current) use of insulin: Secondary | ICD-10-CM

## 2014-05-30 DIAGNOSIS — I110 Hypertensive heart disease with heart failure: Secondary | ICD-10-CM | POA: Diagnosis present

## 2014-05-30 LAB — BASIC METABOLIC PANEL
ANION GAP: 13 (ref 5–15)
BUN: 12 mg/dL (ref 6–23)
CHLORIDE: 103 meq/L (ref 96–112)
CO2: 24 mEq/L (ref 19–32)
CREATININE: 1.13 mg/dL — AB (ref 0.50–1.10)
Calcium: 9.3 mg/dL (ref 8.4–10.5)
GFR, EST AFRICAN AMERICAN: 71 mL/min — AB (ref >90)
GFR, EST NON AFRICAN AMERICAN: 61 mL/min — AB (ref >90)
Glucose, Bld: 261 mg/dL — ABNORMAL HIGH (ref 70–99)
Potassium: 4.1 mEq/L (ref 3.7–5.3)
Sodium: 140 mEq/L (ref 137–147)

## 2014-05-30 LAB — CBC
HCT: 33.7 % — ABNORMAL LOW (ref 36.0–46.0)
Hemoglobin: 10.8 g/dL — ABNORMAL LOW (ref 12.0–15.0)
MCH: 22.1 pg — ABNORMAL LOW (ref 26.0–34.0)
MCHC: 32 g/dL (ref 30.0–36.0)
MCV: 69.1 fL — AB (ref 78.0–100.0)
PLATELETS: 239 10*3/uL (ref 150–400)
RBC: 4.88 MIL/uL (ref 3.87–5.11)
RDW: 16.6 % — AB (ref 11.5–15.5)
WBC: 13.2 10*3/uL — ABNORMAL HIGH (ref 4.0–10.5)

## 2014-05-30 LAB — URINE MICROSCOPIC-ADD ON

## 2014-05-30 LAB — URINALYSIS, ROUTINE W REFLEX MICROSCOPIC
Bilirubin Urine: NEGATIVE
Glucose, UA: 250 mg/dL — AB
Ketones, ur: NEGATIVE mg/dL
Leukocytes, UA: NEGATIVE
NITRITE: NEGATIVE
Specific Gravity, Urine: 1.009 (ref 1.005–1.030)
Urobilinogen, UA: 0.2 mg/dL (ref 0.0–1.0)
pH: 6.5 (ref 5.0–8.0)

## 2014-05-30 LAB — I-STAT TROPONIN, ED: TROPONIN I, POC: 0.04 ng/mL (ref 0.00–0.08)

## 2014-05-30 LAB — D-DIMER, QUANTITATIVE (NOT AT ARMC): D-Dimer, Quant: 1.27 ug/mL-FEU — ABNORMAL HIGH (ref 0.00–0.48)

## 2014-05-30 LAB — POC URINE PREG, ED: Preg Test, Ur: NEGATIVE

## 2014-05-30 MED ORDER — ONDANSETRON HCL 4 MG/2ML IJ SOLN
4.0000 mg | Freq: Once | INTRAMUSCULAR | Status: AC
  Administered 2014-05-30: 4 mg via INTRAVENOUS
  Filled 2014-05-30: qty 2

## 2014-05-30 MED ORDER — LORAZEPAM 2 MG/ML IJ SOLN
1.0000 mg | Freq: Once | INTRAMUSCULAR | Status: AC
  Administered 2014-05-30: 1 mg via INTRAVENOUS
  Filled 2014-05-30: qty 1

## 2014-05-30 MED ORDER — SODIUM CHLORIDE 0.9 % IV BOLUS (SEPSIS)
1000.0000 mL | INTRAVENOUS | Status: AC
  Administered 2014-05-30: 1000 mL via INTRAVENOUS

## 2014-05-30 MED ORDER — IOHEXOL 350 MG/ML SOLN
100.0000 mL | Freq: Once | INTRAVENOUS | Status: AC | PRN
  Administered 2014-05-30: 100 mL via INTRAVENOUS

## 2014-05-30 NOTE — ED Notes (Signed)
Pt reports feeling "winded"  O2 checked, Pt is 92% on Room Air, different from 98% on Room Air during triage. Place on 2LPM O2 via nasal cannula for comfort.

## 2014-05-30 NOTE — ED Notes (Signed)
EDP Docherty notified of room air O2 sats

## 2014-05-30 NOTE — ED Notes (Addendum)
Pt states that she has been having jittery-ness, hot flashes and diarrhea x 3 days.  Had some medication changes 2 months ago.  Denies pain.  Started nutrisystem the day before sx started.

## 2014-05-30 NOTE — ED Provider Notes (Signed)
CSN: [id number]     Arrival date & time 05/30/14  1840 History   First MD Initiated Contact with Patient 05/30/14 1904     Chief Complaint  Patient presents with  . Hot Flashes  . Diarrhea     (Consider location/radiation/quality/duration/timing/severity/associated sxs/prior Treatment) The history is provided by the patient and medical records. No language interpreter was used.    Brittany Lopez is a 38 y.o. female  with a hx of non-insulin-dependent diabetes, hypertension presents to the Emergency Department complaining of gradual, persistent, gradually improving, diarrhea onset 3 days ago after going out to eat.  Pt reports 4-5 episodes of watery diarrhea each day without melena or hematochezia.  Pt reports today she developed hot flashes, nausea and began feeling jittery.  Patient reports she began new diet regimen 24 hours before the symptoms began..  No treatments PTA and no aggravating or alleviating factors.  Pt reports the first day she felt "off" and has been supplementing with salad and other foods, but has been on a significantly reduced calorie diet.  Pt denies fever, chills, headache, neck pain, chest pain, SOB, abd pain, vomiting, weakness, dizziness, syncope, dysuria.   Denies hospitalization, travel, sick contacts or recent antibiotics. Pt reports her BP and diabetes medications were reduced several months ago, but she has been maintaining.    Past Medical History  Diagnosis Date  . Diabetes mellitus without complication   . Hypertension    No past surgical history on file. Family History  Problem Relation Age of Onset  . Diabetes Mother   . Heart disease Mother   . Hypertension Mother   . Stroke Mother    History  Substance Use Topics  . Smoking status: Never Smoker   . Smokeless tobacco: Not on file  . Alcohol Use: No   OB History   Grav Para Term Preterm Abortions TAB SAB Ect Mult Living                 Review of Systems  Constitutional: Positive for  fatigue. Negative for fever, diaphoresis, appetite change and unexpected weight change.  HENT: Negative for mouth sores.   Eyes: Negative for visual disturbance.  Respiratory: Negative for cough, chest tightness, shortness of breath and wheezing.   Cardiovascular: Negative for chest pain.  Gastrointestinal: Positive for nausea and diarrhea. Negative for vomiting, abdominal pain and constipation.  Endocrine: Negative for polydipsia, polyphagia and polyuria.       Hot flashes  Genitourinary: Negative for dysuria, urgency, frequency and hematuria.  Musculoskeletal: Negative for back pain and neck stiffness.  Skin: Negative for rash.  Allergic/Immunologic: Negative for immunocompromised state.  Neurological: Negative for syncope, light-headedness and headaches.       Feeling jittery  Hematological: Does not bruise/bleed easily.  Psychiatric/Behavioral: Negative for sleep disturbance. The patient is not nervous/anxious.       Allergies  Review of patient's allergies indicates no known allergies.  Home Medications   Prior to Admission medications   Medication Sig Start Date End Date Taking? Authorizing Provider  lisinopril (PRINIVIL,ZESTRIL) 20 MG tablet Take 1 tablet (20 mg total) by mouth daily. 03/29/14  Yes Emi Belfast, FNP  metFORMIN (GLUCOPHAGE) 1000 MG tablet Take 1 tablet (1,000 mg total) by mouth 2 (two) times daily with a meal. 03/29/14  Yes Emi Belfast, FNP   BP 200/85  Pulse 118  Temp(Src) 98.4 F (36.9 C) (Oral)  Resp 20  SpO2 100%  LMP 05/26/2014 Physical Exam  Nursing note and  vitals reviewed. Constitutional: She appears well-developed and well-nourished. No distress.  Awake, alert, nontoxic appearance  HENT:  Head: Normocephalic and atraumatic.  Right Ear: Tympanic membrane, external ear and ear canal normal.  Left Ear: Tympanic membrane, external ear and ear canal normal.  Nose: Nose normal.  Mouth/Throat: Uvula is midline. Mucous membranes are dry.  No uvula swelling. No oropharyngeal exudate, posterior oropharyngeal edema, posterior oropharyngeal erythema or tonsillar abscesses.  Mucous membranes slightly dry  Eyes: Conjunctivae are normal. No scleral icterus.  Neck: Normal range of motion. Neck supple.  Cardiovascular: Normal rate, regular rhythm and intact distal pulses.   Pulmonary/Chest: Effort normal and breath sounds normal. No respiratory distress. She has no wheezes.  Equal chest expansion Diminished with clear and equal breath sounds  Abdominal: Soft. Bowel sounds are normal. She exhibits no distension and no mass. There is no tenderness. There is no rebound and no guarding.  Obese Abdomen soft and nontender, no rebound or peritoneal signs  Musculoskeletal: Normal range of motion. She exhibits no edema.  Neurological: She is alert.  Speech is clear and goal oriented Moves extremities without ataxia  Skin: Skin is warm and dry. She is not diaphoretic.  Psychiatric: She has a normal mood and affect.    ED Course  Procedures (including critical care time) Labs Review Labs Reviewed  CBC - Abnormal; Notable for the following:    WBC 13.2 (*)    Hemoglobin 10.8 (*)    HCT 33.7 (*)    MCV 69.1 (*)    MCH 22.1 (*)    RDW 16.6 (*)    All other components within normal limits  BASIC METABOLIC PANEL - Abnormal; Notable for the following:    Glucose, Bld 261 (*)    Creatinine, Ser 1.13 (*)    GFR calc non Af Amer 61 (*)    GFR calc Af Amer 71 (*)    All other components within normal limits  URINALYSIS, ROUTINE W REFLEX MICROSCOPIC - Abnormal; Notable for the following:    Glucose, UA 250 (*)    Hgb urine dipstick LARGE (*)    Protein, ur >300 (*)    All other components within normal limits  D-DIMER, QUANTITATIVE - Abnormal; Notable for the following:    D-Dimer, Quant 1.27 (*)    All other components within normal limits  URINE MICROSCOPIC-ADD ON - Abnormal; Notable for the following:    Squamous Epithelial / LPF  FEW (*)    All other components within normal limits  OVA AND PARASITE EXAMINATION  GI PATHOGEN PANEL BY PCR, STOOL  URINE RAPID DRUG SCREEN (HOSP PERFORMED)  POC URINE PREG, ED  I-STAT TROPOININ, ED  POC URINE PREG, ED    Imaging Review Dg Chest 2 View  05/30/2014   CLINICAL DATA:  Short of breath.  Weakness.  EXAM: CHEST  2 VIEW  COMPARISON:  05/23/2010.  FINDINGS: Cardiac silhouette is normal in size. Normal mediastinal and hilar contours.  There is irregular interstitial thickening that is most evident the right lung. Mild thickening is noted of the fissures. No focal lung consolidation. No pleural effusion or pneumothorax.  Bony thorax is intact.  IMPRESSION: 1. Irregular interstitial thickening most evident in the right lung. Findings could reflect asymmetric interstitial edema. Interstitial infiltrate, either infectious or inflammatory, should be considered in the proper clinical setting. No evidence of lobar pneumonia. No pleural effusion.   Electronically Signed   By: Amie Portland M.D.   On: 05/30/2014 21:50  EKG Interpretation   Date/Time:  Sunday May 30 2014 21:01:34 EDT Ventricular Rate:  122 PR Interval:  128 QRS Duration: 85 QT Interval:  344 QTC Calculation: 490 R Axis:   59 Text Interpretation:  Sinus tachycardia Consider right atrial enlargement  Borderline repolarization abnormality Borderline prolonged QT interval  Confirmed by DOCHERTY  MD, MEGAN ([id number]) on 05/30/2014 9:06:27 PM      MDM   Final diagnoses:  Hypoxia  Tachycardia  Diarrhea  Essential hypertension  Type 2 diabetes mellitus with hyperglycemia   Brittany Lopez presents with several days of diarrhea.  Hx and physical consistent with mild dehydration.  Will check basic labs, give fluid bolus and send stool cultures to r/o O&P, c-diff.    9:15PM Patient with hypoxia 86% with ambulation here in the emergency department and with increasing tachycardia. Patient remains hypertensive. She  continues to deny chest pain however now reports that she feels "winded."  Will obtain CT angiogram chest.  Labs otherwise reassuring with mild leukocytosis.  Slight increase in exam creatinine to 1.13 likely secondary to dehydration as patient appears dehydrated on initial exam. EKG with sinus tachycardia. We'll obtain troponin.  10:21 PM Troponin within normal limits. D-dimer elevated. Patient continues to complain of feeling "winded." Chest x-ray with irregular interstitial thickening; CT angiogram chest pending.  12:40 AM Pt now hypoxic to 77%, Pt placed on nonrebreather with improvement in hypoxia.  CT angio chest still pending.  Discussed with Dr. Toniann Fail who will admit to stepdown.    The patient was discussed with and seen by Dr. Micheline Maze who agrees with the treatment plan.   Dahlia Client Brittany Prestage, PA-C 05/31/14 [phone]

## 2014-05-30 NOTE — ED Notes (Addendum)
Pt ambulates well without assistance.  

## 2014-05-30 NOTE — ED Notes (Signed)
PA Hannah notified of Pt feeling short of breath and hypertension.

## 2014-05-30 NOTE — ED Notes (Signed)
PA and EDP aware of Pt's O2 sat after ambulation.

## 2014-05-31 ENCOUNTER — Encounter (HOSPITAL_COMMUNITY): Payer: Self-pay

## 2014-05-31 ENCOUNTER — Emergency Department (HOSPITAL_COMMUNITY): Payer: Self-pay

## 2014-05-31 DIAGNOSIS — J810 Acute pulmonary edema: Secondary | ICD-10-CM

## 2014-05-31 DIAGNOSIS — R0902 Hypoxemia: Secondary | ICD-10-CM | POA: Insufficient documentation

## 2014-05-31 DIAGNOSIS — J9601 Acute respiratory failure with hypoxia: Secondary | ICD-10-CM | POA: Diagnosis present

## 2014-05-31 DIAGNOSIS — E119 Type 2 diabetes mellitus without complications: Secondary | ICD-10-CM

## 2014-05-31 DIAGNOSIS — I5033 Acute on chronic diastolic (congestive) heart failure: Secondary | ICD-10-CM

## 2014-05-31 DIAGNOSIS — I517 Cardiomegaly: Secondary | ICD-10-CM

## 2014-05-31 DIAGNOSIS — I10 Essential (primary) hypertension: Secondary | ICD-10-CM

## 2014-05-31 DIAGNOSIS — J9600 Acute respiratory failure, unspecified whether with hypoxia or hypercapnia: Principal | ICD-10-CM

## 2014-05-31 DIAGNOSIS — R197 Diarrhea, unspecified: Secondary | ICD-10-CM

## 2014-05-31 LAB — COMPREHENSIVE METABOLIC PANEL
ALT: 10 U/L (ref 0–35)
AST: 9 U/L (ref 0–37)
Albumin: 2.4 g/dL — ABNORMAL LOW (ref 3.5–5.2)
Alkaline Phosphatase: 69 U/L (ref 39–117)
Anion gap: 11 (ref 5–15)
BUN: 11 mg/dL (ref 6–23)
CO2: 24 mEq/L (ref 19–32)
Calcium: 8.4 mg/dL (ref 8.4–10.5)
Chloride: 103 mEq/L (ref 96–112)
Creatinine, Ser: 1.07 mg/dL (ref 0.50–1.10)
GFR calc Af Amer: 75 mL/min — ABNORMAL LOW (ref >90)
GFR calc non Af Amer: 65 mL/min — ABNORMAL LOW (ref >90)
Glucose, Bld: 288 mg/dL — ABNORMAL HIGH (ref 70–99)
Potassium: 3.9 mEq/L (ref 3.7–5.3)
Sodium: 138 mEq/L (ref 137–147)
Total Bilirubin: <0.2 mg/dL — ABNORMAL LOW (ref 0.3–1.2)
Total Protein: 6.3 g/dL (ref 6.0–8.3)

## 2014-05-31 LAB — TROPONIN I: Troponin I: <0.3 ng/mL (ref <0.30)

## 2014-05-31 LAB — GLUCOSE, CAPILLARY
Glucose-Capillary: 267 mg/dL — ABNORMAL HIGH (ref 70–99)
Glucose-Capillary: 297 mg/dL — ABNORMAL HIGH (ref 70–99)
Glucose-Capillary: 368 mg/dL — ABNORMAL HIGH (ref 70–99)
Glucose-Capillary: 263 mg/dL — ABNORMAL HIGH (ref 70–99)
Glucose-Capillary: 164 mg/dL — ABNORMAL HIGH (ref 70–99)

## 2014-05-31 LAB — RAPID URINE DRUG SCREEN, HOSP PERFORMED
AMPHETAMINES: NOT DETECTED
BARBITURATES: NOT DETECTED
Benzodiazepines: NOT DETECTED
COCAINE: NOT DETECTED
OPIATES: NOT DETECTED
TETRAHYDROCANNABINOL: NOT DETECTED

## 2014-05-31 LAB — HEPATIC FUNCTION PANEL
ALBUMIN: 2.4 g/dL — AB (ref 3.5–5.2)
ALK PHOS: 72 U/L (ref 39–117)
ALT: 10 U/L (ref 0–35)
AST: 10 U/L (ref 0–37)
Bilirubin, Direct: <0.2 mg/dL (ref 0.0–0.3)
Total Protein: 6.4 g/dL (ref 6.0–8.3)

## 2014-05-31 LAB — CBC WITH DIFFERENTIAL/PLATELET
Basophils Absolute: 0 10*3/uL (ref 0.0–0.1)
Basophils Relative: 0 % (ref 0–1)
Eosinophils Absolute: 0.1 10*3/uL (ref 0.0–0.7)
Eosinophils Relative: 1 % (ref 0–5)
HCT: 31.4 % — ABNORMAL LOW (ref 36.0–46.0)
Hemoglobin: 9.7 g/dL — ABNORMAL LOW (ref 12.0–15.0)
Lymphocytes Relative: 29 % (ref 12–46)
Lymphs Abs: 4 10*3/uL (ref 0.7–4.0)
MCH: 21.9 pg — ABNORMAL LOW (ref 26.0–34.0)
MCHC: 30.9 g/dL (ref 30.0–36.0)
MCV: 71 fL — ABNORMAL LOW (ref 78.0–100.0)
Monocytes Absolute: 0.6 10*3/uL (ref 0.1–1.0)
Monocytes Relative: 4 % (ref 3–12)
Neutro Abs: 9.1 10*3/uL — ABNORMAL HIGH (ref 1.7–7.7)
Neutrophils Relative %: 66 % (ref 43–77)
Platelets: 245 10*3/uL (ref 150–400)
RBC: 4.42 MIL/uL (ref 3.87–5.11)
RDW: 16.6 % — ABNORMAL HIGH (ref 11.5–15.5)
WBC: 13.8 10*3/uL — ABNORMAL HIGH (ref 4.0–10.5)

## 2014-05-31 LAB — LIPASE, BLOOD: LIPASE: 39 U/L (ref 11–59)

## 2014-05-31 LAB — PREGNANCY, URINE: Preg Test, Ur: NEGATIVE

## 2014-05-31 LAB — T4, FREE: Free T4: 1.18 ng/dL (ref 0.80–1.80)

## 2014-05-31 LAB — LEGIONELLA ANTIGEN, URINE: LEGIONELLA ANTIGEN, URINE: NEGATIVE

## 2014-05-31 LAB — HEMOGLOBIN A1C
HEMOGLOBIN A1C: 10.3 % — AB (ref <5.7)
Mean Plasma Glucose: 249 mg/dL — ABNORMAL HIGH (ref <117)

## 2014-05-31 LAB — STREP PNEUMONIAE URINARY ANTIGEN: Strep Pneumo Urinary Antigen: NEGATIVE

## 2014-05-31 LAB — TSH: TSH: 4.45 u[IU]/mL (ref 0.350–4.500)

## 2014-05-31 LAB — T3, FREE: T3, Free: 3 pg/mL (ref 2.3–4.2)

## 2014-05-31 LAB — PRO B NATRIURETIC PEPTIDE: Pro B Natriuretic peptide (BNP): 1697 pg/mL — ABNORMAL HIGH (ref 0–125)

## 2014-05-31 LAB — MRSA PCR SCREENING: MRSA by PCR: INVALID — AB

## 2014-05-31 MED ORDER — CHLORHEXIDINE GLUCONATE 0.12 % MT SOLN
15.0000 mL | Freq: Two times a day (BID) | OROMUCOSAL | Status: DC
  Administered 2014-06-01 – 2014-06-04 (×5): 15 mL via OROMUCOSAL
  Filled 2014-05-31 (×9): qty 15

## 2014-05-31 MED ORDER — NITROGLYCERIN 0.4 MG/HR TD PT24
0.4000 mg | MEDICATED_PATCH | Freq: Every day | TRANSDERMAL | Status: DC
  Administered 2014-05-31 – 2014-06-01 (×2): 0.4 mg via TRANSDERMAL
  Filled 2014-05-31 (×3): qty 1

## 2014-05-31 MED ORDER — INSULIN ASPART 100 UNIT/ML ~~LOC~~ SOLN
0.0000 [IU] | Freq: Three times a day (TID) | SUBCUTANEOUS | Status: DC
  Administered 2014-05-31: 8 [IU] via SUBCUTANEOUS
  Administered 2014-06-01 (×2): 3 [IU] via SUBCUTANEOUS
  Administered 2014-06-01: 8 [IU] via SUBCUTANEOUS

## 2014-05-31 MED ORDER — LABETALOL HCL 5 MG/ML IV SOLN
10.0000 mg | INTRAVENOUS | Status: DC | PRN
  Administered 2014-05-31 – 2014-06-02 (×9): 10 mg via INTRAVENOUS
  Filled 2014-05-31 (×9): qty 4

## 2014-05-31 MED ORDER — FUROSEMIDE 10 MG/ML IJ SOLN
40.0000 mg | Freq: Once | INTRAMUSCULAR | Status: AC
  Administered 2014-05-31: 40 mg via INTRAVENOUS
  Filled 2014-05-31: qty 4

## 2014-05-31 MED ORDER — ONDANSETRON HCL 4 MG/2ML IJ SOLN: 4.0000 mg | Freq: Four times a day (QID) | INTRAMUSCULAR | Status: DC | PRN

## 2014-05-31 MED ORDER — ACETAMINOPHEN 325 MG PO TABS: 650.0000 mg | ORAL_TABLET | Freq: Four times a day (QID) | ORAL | Status: DC | PRN

## 2014-05-31 MED ORDER — ACETAMINOPHEN 650 MG RE SUPP: 650.0000 mg | Freq: Four times a day (QID) | RECTAL | Status: DC | PRN

## 2014-05-31 MED ORDER — LORAZEPAM 2 MG/ML IJ SOLN
1.0000 mg | Freq: Once | INTRAMUSCULAR | Status: AC
  Administered 2014-05-31: 1 mg via INTRAVENOUS
  Filled 2014-05-31: qty 1

## 2014-05-31 MED ORDER — ENOXAPARIN SODIUM 40 MG/0.4ML ~~LOC~~ SOLN
40.0000 mg | SUBCUTANEOUS | Status: DC
  Administered 2014-05-31 – 2014-06-04 (×5): 40 mg via SUBCUTANEOUS
  Filled 2014-05-31 (×5): qty 0.4

## 2014-05-31 MED ORDER — LABETALOL HCL 5 MG/ML IV SOLN
10.0000 mg | Freq: Once | INTRAVENOUS | Status: AC
  Administered 2014-05-31: 10 mg via INTRAVENOUS
  Filled 2014-05-31: qty 4

## 2014-05-31 MED ORDER — SODIUM CHLORIDE 0.9 % IV SOLN
INTRAVENOUS | Status: DC
  Administered 2014-05-31: 07:00:00 via INTRAVENOUS

## 2014-05-31 MED ORDER — CARVEDILOL 3.125 MG PO TABS
3.1250 mg | ORAL_TABLET | Freq: Two times a day (BID) | ORAL | Status: DC
  Filled 2014-05-31: qty 1

## 2014-05-31 MED ORDER — CARVEDILOL 12.5 MG PO TABS
12.5000 mg | ORAL_TABLET | Freq: Two times a day (BID) | ORAL | Status: DC
  Administered 2014-05-31 – 2014-06-04 (×8): 12.5 mg via ORAL
  Filled 2014-05-31 (×12): qty 1

## 2014-05-31 MED ORDER — INSULIN ASPART 100 UNIT/ML ~~LOC~~ SOLN: 0.0000 [IU] | Freq: Three times a day (TID) | SUBCUTANEOUS | Status: DC

## 2014-05-31 MED ORDER — INSULIN GLARGINE 100 UNIT/ML ~~LOC~~ SOLN
10.0000 [IU] | Freq: Every day | SUBCUTANEOUS | Status: DC
  Administered 2014-05-31 – 2014-06-01 (×2): 10 [IU] via SUBCUTANEOUS
  Filled 2014-05-31 (×2): qty 0.1

## 2014-05-31 MED ORDER — LEVOFLOXACIN IN D5W 750 MG/150ML IV SOLN
750.0000 mg | Freq: Every day | INTRAVENOUS | Status: DC
  Administered 2014-05-31 (×2): 750 mg via INTRAVENOUS
  Filled 2014-05-31 (×2): qty 150

## 2014-05-31 MED ORDER — LISINOPRIL 20 MG PO TABS
20.0000 mg | ORAL_TABLET | Freq: Every day | ORAL | Status: DC
  Filled 2014-05-31: qty 1

## 2014-05-31 MED ORDER — INSULIN GLARGINE 100 UNIT/ML ~~LOC~~ SOLN: 10.0000 [IU] | Freq: Every day | SUBCUTANEOUS | Status: DC

## 2014-05-31 MED ORDER — INSULIN ASPART 100 UNIT/ML ~~LOC~~ SOLN: 0.0000 [IU] | Freq: Every day | SUBCUTANEOUS | Status: DC

## 2014-05-31 MED ORDER — IOHEXOL 350 MG/ML SOLN
100.0000 mL | Freq: Once | INTRAVENOUS | Status: AC | PRN
  Administered 2014-05-31: 100 mL via INTRAVENOUS

## 2014-05-31 MED ORDER — INSULIN ASPART 100 UNIT/ML ~~LOC~~ SOLN
3.0000 [IU] | Freq: Three times a day (TID) | SUBCUTANEOUS | Status: DC
  Administered 2014-05-31 – 2014-06-01 (×3): 3 [IU] via SUBCUTANEOUS

## 2014-05-31 MED ORDER — ONDANSETRON HCL 4 MG PO TABS: 4.0000 mg | ORAL_TABLET | Freq: Four times a day (QID) | ORAL | Status: DC | PRN

## 2014-05-31 MED ORDER — CETYLPYRIDINIUM CHLORIDE 0.05 % MT LIQD
7.0000 mL | Freq: Two times a day (BID) | OROMUCOSAL | Status: DC
  Administered 2014-05-31 – 2014-06-03 (×6): 7 mL via OROMUCOSAL

## 2014-05-31 MED ORDER — LISINOPRIL 10 MG PO TABS
20.0000 mg | ORAL_TABLET | Freq: Every day | ORAL | Status: DC
  Administered 2014-05-31 – 2014-06-01 (×2): 20 mg via ORAL
  Filled 2014-05-31 (×2): qty 2

## 2014-05-31 MED ORDER — HYDRALAZINE HCL 20 MG/ML IJ SOLN
10.0000 mg | Freq: Once | INTRAMUSCULAR | Status: AC
  Administered 2014-05-31: 10 mg via INTRAVENOUS
  Filled 2014-05-31: qty 1

## 2014-05-31 MED ORDER — INSULIN ASPART 100 UNIT/ML ~~LOC~~ SOLN
0.0000 [IU] | Freq: Three times a day (TID) | SUBCUTANEOUS | Status: DC
  Administered 2014-05-31: 7 [IU] via SUBCUTANEOUS

## 2014-05-31 MED ORDER — FUROSEMIDE 10 MG/ML IJ SOLN
40.0000 mg | Freq: Two times a day (BID) | INTRAMUSCULAR | Status: DC
  Administered 2014-05-31 – 2014-06-01 (×3): 40 mg via INTRAVENOUS
  Filled 2014-05-31 (×3): qty 4

## 2014-05-31 MED ORDER — SODIUM CHLORIDE 0.9 % IJ SOLN
3.0000 mL | Freq: Two times a day (BID) | INTRAMUSCULAR | Status: DC
  Administered 2014-05-31 – 2014-06-03 (×6): 3 mL via INTRAVENOUS

## 2014-05-31 NOTE — Progress Notes (Signed)
PROGRESS NOTE    Brittany Lopez ZOX:[id number] DOB: 12-Jun-1976 DOA: 05/30/2014 PCP: No PCP Per Patient at [facility].  HPI/Brief narrative 38 year old female patient with history of DM 2, HTN, presented to the ER with complaints of diarrhea and weakness. She states that her symptoms started a couple of hours after eating at a [place] [employer] on 05/27/14. She denied nausea, vomiting, fever, chills or abdominal pain. In the ED, patient started having dyspnea. D-dimer was positive. CTA chest was negative for PE but suggested findings of pulmonary edema versus ARDS. She was admitted to step down unit for further management.    Assessment/Plan:  1. Diarrhea: Likely acute viral GE versus food poisoning. Improving. Supportive treatment. Diet as tolerated. Followup C. difficile and GI pathogen panel PCR and stool culture results. 2. Possible acute diastolic CHF: Treated with IV Lasix and improving. 2-D echo shows LVEF 50-55%, cannot exclude inferior hypokinesis, grade 1 diastolic dysfunction and cannot exclude vegetations/mass or noncoronary or left coronary cusp. Cardiology consultation. 3. Acute respiratory failure with hypoxia: Likely precipitated by problem #2. Improving after diuresis. Continue above treatment. Consider discontinuing antibiotics-low index of suspicion for pneumonia. CTA chest: No large PE 4. Accelerated hypertension: Resume home dose of lisinopril. Will add carvedilol 3.125 mg twice a day When necessary IV labetalol. This may have also precipitated her acute diastolic CHF. 5. Uncontrolled DM 2: Metformin held. Start low-dose Lantus and continue NovoLog SSI. Check A1c. 6. Anemia: Possibly chronic. Follow CBCs. No reported bleeding. 7. Mild leukocytosis: Likely stress response. Follow CBC.  Code Status: Full Family Communication: None at bedside Disposition Plan: Continue management in step down unit for additional 24  hours.   Consultants:  None  Procedures:  Foley-DC'd 9/21  Antibiotics:  IV levofloxacin 9/21 >   Subjective: States that her diarrhea has improved and has not had a BM since last night in the ED. Dyspnea improving. Denies chest pain or cough. Wanted Foley catheter out this morning.  Objective: Filed Vitals:   05/31/14 1300 05/31/14 1400 05/31/14 1415 05/31/14 1423  BP: 180/96 182/96 173/96 179/90  Pulse: 103 91 96 98  Temp:      TempSrc:      Resp: Height:      Weight:      SpO2: 100% 96% 98% 97%    Intake/Output Summary (Last 24 hours) at 05/31/14 1458 Last data filed at 05/31/14 1000  Gross per 24 hour  Intake    200 ml  Output   2250 ml  Net  -2050 ml   Filed Weights   05/31/14 0234  Weight: 136.079 kg (300 lb)     Exam:  General exam: Moderately built and morbidly obese female lying comfortably propped up in bed. Respiratory system: Diminished breath sounds in the bases with few basal crackles but otherwise clear to auscultation. No wheezing or rhonchi. No increased work of breathing. Cardiovascular system: S1 & S2 heard, RRR. No JVD, murmurs, gallops, clicks. Trace bilateral leg edema. Telemetry: Sinus tachycardia in the 100s. Gastrointestinal system: Abdomen is nondistended, soft and nontender. Normal bowel sounds heard. Central nervous system: Alert and oriented. No focal neurological deficits. Extremities: Symmetric 5 x 5 power.   Data Reviewed: Basic Metabolic Panel:  Recent Labs Lab 05/30/14 2002 05/31/14 0334  NA 140 138  K 4.1 3.9  CL 103 103  CO2 24 24  GLUCOSE 261* 288*  BUN 12 11  CREATININE 1.13* 1.07  CALCIUM 9.3 8.4   Liver  Function Tests:  Recent Labs Lab 05/31/14 0334  AST 10  9  ALT 10  10  ALKPHOS 72  69  BILITOT <0.2*  <0.2*  PROT 6.4  6.3  ALBUMIN 2.4*  2.4*    Recent Labs Lab 05/31/14 0334  LIPASE 39   No results found for this basename: AMMONIA,  in the last 168 hours CBC:  Recent  Labs Lab 05/30/14 2002 05/31/14 0334  WBC 13.2* 13.8*  NEUTROABS  --  9.1*  HGB 10.8* 9.7*  HCT 33.7* 31.4*  MCV 69.1* 71.0*  PLT 239 245   Cardiac Enzymes:  Recent Labs Lab 05/31/14 0334  TROPONINI <0.30   BNP (last 3 results)  Recent Labs  05/31/14 0334  PROBNP 1697.0*   CBG:  Recent Labs Lab 05/31/14 0533 05/31/14 0806 05/31/14 1217  GLUCAP 267* 297* 368*    Recent Results (from the past 240 hour(s))  MRSA PCR SCREENING     Status: Abnormal   Collection Time    05/31/14  3:34 AM      Result Value Ref Range Status   MRSA by PCR INVALID RESULTS, SPECIMEN SENT FOR CULTURE (*) NEGATIVE Final   Comment: NOTIFIED M. REEVES RN AT 0540 ON 09.21.15 BY SHUEA                The GeneXpert MRSA Assay (FDA     approved for NASAL specimens     only), is one component of a     comprehensive MRSA colonization     surveillance program. It is not     intended to diagnose MRSA     infection nor to guide or     monitor treatment for     MRSA infections.       Additional labs: 1. 2 D Echo: Impressions:  - Limited images overall. Mild LVH with LVEF approximately 50-55%, cannot exclude inferior hypokinesis, grade 1 diastolic dysfunction. Appears to be an area of moderate calcification involving the noncoronary or left coronary cusp and adjacent annulus. Not well seen, cannot completely exclude a calcified vegetation or mass. This could be further visualized by TEE, if felt to be clinically appropriate. Trivial tricuspid regurgitation, unable to assess PASP.      Studies: Dg Chest 2 View  05/30/2014   CLINICAL DATA:  Short of breath.  Weakness.  EXAM: CHEST  2 VIEW  COMPARISON:  05/23/2010.  FINDINGS: Cardiac silhouette is normal in size. Normal mediastinal and hilar contours.  There is irregular interstitial thickening that is most evident the right lung. Mild thickening is noted of the fissures. No focal lung consolidation. No pleural effusion or pneumothorax.   Bony thorax is intact.  IMPRESSION: 1. Irregular interstitial thickening most evident in the right lung. Findings could reflect asymmetric interstitial edema. Interstitial infiltrate, either infectious or inflammatory, should be considered in the proper clinical setting. No evidence of lobar pneumonia. No pleural effusion.   Electronically Signed   By: Amie Portland M.D.   On: 05/30/2014 21:50   Ct Angio Chest Pe W/cm &/or Wo Cm  05/31/2014   CLINICAL DATA:  Chest pain, hypoxia for 3 days with elevated D-dimer and elevated white blood count  EXAM: CT ANGIOGRAPHY CHEST WITH CONTRAST  TECHNIQUE: Multidetector CT imaging of the chest was performed using the standard protocol during bolus administration of intravenous contrast. Multiplanar CT image reconstructions and MIPs were obtained to evaluate the vascular anatomy.  CONTRAST:  OMNIPAQUE IOHEXOL 350 MG/ML SOLN  COMPARISON:  Chest radiograph  05/30/2014  FINDINGS: There is significant limitation in the evaluation of the pulmonary arterial system, primarily due to patient body habitus. There are no large central pulmonary emboli. Pulmonary artery branches beyond the hila are very difficult to evaluate and small pulmonary arterial emboli cannot be excluded. The thoracic aorta is not dilated.  Evaluation of pulmonary arteries is further limited by the presence of extensive bilateral parenchymal alveolar opacification. This is same bilaterally in all lung zones, most severe in the central perihilar areas bilaterally.  There is minimal right pleural fluid. No pericardial effusion or left pleural effusion. There is mild cardiac enlargement.  Images through the upper abdomen are unremarkable. There are no acute musculoskeletal findings.  Review of the MIP images confirms the above findings.  IMPRESSION: Very limited evaluation of the pulmonary arterial system with no evidence of large central emboli.  Severe bilateral alveolar opacification. Distribution suggests  the possibility acute severe pulmonary edema. Pulmonary hemorrhage and ARDS are also considerations.   Electronically Signed   By: Esperanza Heir M.D.   On: 05/31/2014 02:01        Scheduled Meds: . antiseptic oral rinse  7 mL Mouth Rinse q12n4p  . chlorhexidine  15 mL Mouth Rinse BID  . enoxaparin (LOVENOX) injection  40 mg Subcutaneous Q24H  . furosemide  40 mg Intravenous BID  . insulin aspart  0-5 Units Subcutaneous QHS  . insulin aspart  0-9 Units Subcutaneous TID WC  . levofloxacin (LEVAQUIN) IV  750 mg Intravenous QHS  . lisinopril  20 mg Oral Daily  . nitroGLYCERIN  0.4 mg Transdermal Daily  . sodium chloride  3 mL Intravenous Q12H   Continuous Infusions: . sodium chloride 10 mL/hr at 05/31/14 8295    Principal Problem:   Acute respiratory failure with hypoxia Active Problems:   Type 2 diabetes mellitus with hyperglycemia   Uncontrolled hypertension    Time spent: 45 minutes    HONGALGI,ANAND, MD, FACP, FHM. [facility] Pager [phone]  If 7PM-7AM, please contact night-coverage [email or web address] Password [email or web address] 05/31/2014, 2:58 PM    LOS: 1 day

## 2014-05-31 NOTE — ED Notes (Signed)
CT here to get pt, pt states she feels like she needs more ativan, ativan to be given

## 2014-05-31 NOTE — Progress Notes (Signed)
ANTIBIOTIC CONSULT NOTE - INITIAL  Pharmacy Consult for levofloxacin Indication: pneumonia  No Known Allergies  Patient Measurements: Height:  (157.5 cm) Weight: 300 lb (136.079 kg) IBW/kg (Calculated) : 50.1 Adjusted Body Weight:   Vital Signs: Temp: 98.4 F (36.9 C) (09/21 0234) Temp src: Oral (09/21 0234) BP: 193/127 mmHg (09/21 0234) Pulse Rate: 107 (09/21 0234) Intake/Output from previous day: 09/20 0701 - 09/21 0700 In: 150 [IV Piggyback:150] Out: -  Intake/Output from this shift: Total I/O In: 150 [IV Piggyback:150] Out: -   Labs:  Recent Labs  05/30/14 2002 05/31/14 0334  WBC 13.2* 13.8*  HGB 10.8* 9.7*  PLT 239 245  CREATININE 1.13* 1.07   Estimated Creatinine Clearance: 95.1 ml/min (by C-G formula based on Cr of 1.07). No results found for this basename: VANCOTROUGH, VANCOPEAK, VANCORANDOM, GENTTROUGH, GENTPEAK, GENTRANDOM, TOBRATROUGH, TOBRAPEAK, TOBRARND, AMIKACINPEAK, AMIKACINTROU, AMIKACIN,  in the last 72 hours   Microbiology: No results found for this or any previous visit (from the past 720 hour(s)).  Medical History: Past Medical History  Diagnosis Date  . Diabetes mellitus without complication   . Hypertension     Medications:  Anti-infectives   Start     Dose/Rate Route Frequency Ordered Stop   05/31/14 0245  levofloxacin (LEVAQUIN) IVPB 750 mg     750 mg 100 mL/hr over 90 Minutes Intravenous Daily at bedtime 05/31/14 0233       Assessment: Patient with CAP.  First dose of antibiotics already given.  Goal of Therapy:  Levofloxacin dosed based on patient weight and renal function     Plan: Levofloxacin  iv q24hr   Aleene Davidson Crowford 05/31/2014,4:49 AM

## 2014-05-31 NOTE — Care Management Note (Signed)
    Page 1 of 1   05/31/2014     4:27:26 PM CARE MANAGEMENT NOTE 05/31/2014  Patient:  RUMAISA, SCHNETZER   Account Number:  [id number]  Date Initiated:  05/31/2014  Documentation initiated by:  DAVIS,RHONDA  Subjective/Objective Assessment:   resp distress poss ards vs pulmonary edema/fi02 at 100% after o2 sats down to 70%     Action/Plan:   tbd   Anticipated DC Date:  06/03/2014   Anticipated DC Plan:  HOME/SELF CARE  In-house referral  Financial Counselor      DC Planning Services  CM consult      [facility] Choice  NA   Choice offered to / List presented to:  NA      DME agency  NA     HH arranged  NA      HH agency  NA   Status of service:  In process, will continue to follow Medicare Important Message given?   (If response is "NO", the following Medicare IM given date fields will be blank) Date Medicare IM given:   Medicare IM given by:   Date Additional Medicare IM given:   Additional Medicare IM given by:    Discharge Disposition:    Per UR Regulation:  Reviewed for med. necessity/level of care/duration of stay  If discussed at Long Length of Stay Meetings, dates discussed:    Comments:  [id number]/Rhonda Davis,RN,BSn,CCM

## 2014-05-31 NOTE — ED Provider Notes (Signed)
Medical screening examination/treatment/procedure(s) were conducted as a shared visit with non-physician practitioner(s) and myself.  I personally evaluated the patient during the encounter. Pt presenting initially for several days of d/a, reported feeling winded easily. During course of ED stay, pt became persistently hypoxic. D-dimer elevated. On my exam, pt tachypneic, tachycardic, hypertensive. Abdomen benign. She has dec breath sounds at bases. Primary differential at this time is for PE. She has had difficulty laying flat and will have to be given ativan for the study. Will begin consult for admission while trying to get CT as pt will not be appropriate for d/c.  Results for orders placed during the hospital encounter of 05/30/14  MRSA PCR SCREENING      Result Value Ref Range   MRSA by PCR INVALID RESULTS, SPECIMEN SENT FOR CULTURE (*) NEGATIVE  CBC      Result Value Ref Range   WBC 13.2 (*) 4.0 - 10.5 K/uL   RBC 4.88  3.87 - 5.11 MIL/uL   Hemoglobin 10.8 (*) 12.0 - 15.0 g/dL   HCT 16.1 (*) 09.6 - 04.5 %   MCV 69.1 (*) 78.0 - 100.0 fL   MCH 22.1 (*) 26.0 - 34.0 pg   MCHC 32.0  30.0 - 36.0 g/dL   RDW 40.9 (*) 81.1 - 91.4 %   Platelets 239  150 - 400 K/uL  BASIC METABOLIC PANEL      Result Value Ref Range   Sodium 140  137 - 147 mEq/L   Potassium 4.1  3.7 - 5.3 mEq/L   Chloride 103  96 - 112 mEq/L   CO2 24  19 - 32 mEq/L   Glucose, Bld 261 (*) 70 - 99 mg/dL   BUN 12  6 - 23 mg/dL   Creatinine, Ser 7.82 (*) 0.50 - 1.10 mg/dL   Calcium 9.3  8.4 - 95.6 mg/dL   GFR calc non Af Amer 61 (*) >90 mL/min   GFR calc Af Amer 71 (*) >90 mL/min   Anion gap 13  5 - 15  URINALYSIS, ROUTINE W REFLEX MICROSCOPIC      Result Value Ref Range   Color, Urine YELLOW  YELLOW   APPearance CLEAR  CLEAR   Specific Gravity, Urine 1.009  1.005 - 1.030   pH 6.5  5.0 - 8.0   Glucose, UA 250 (*) NEGATIVE mg/dL   Hgb urine dipstick LARGE (*) NEGATIVE   Bilirubin Urine NEGATIVE  NEGATIVE   Ketones, ur  NEGATIVE  NEGATIVE mg/dL   Protein, ur >213 (*) NEGATIVE mg/dL   Urobilinogen, UA 0.2  0.0 - 1.0 mg/dL   Nitrite NEGATIVE  NEGATIVE   Leukocytes, UA NEGATIVE  NEGATIVE  D-DIMER, QUANTITATIVE      Result Value Ref Range   D-Dimer, Quant 1.27 (*) 0.00 - 0.48 ug/mL-FEU  URINE MICROSCOPIC-ADD ON      Result Value Ref Range   Squamous Epithelial / LPF FEW (*) RARE   WBC, UA 0-2  <3 WBC/hpf   RBC / HPF 3-6  <3 RBC/hpf  URINE RAPID DRUG SCREEN (HOSP PERFORMED)      Result Value Ref Range   Opiates NONE DETECTED  NONE DETECTED   Cocaine NONE DETECTED  NONE DETECTED   Benzodiazepines NONE DETECTED  NONE DETECTED   Amphetamines NONE DETECTED  NONE DETECTED   Tetrahydrocannabinol NONE DETECTED  NONE DETECTED   Barbiturates NONE DETECTED  NONE DETECTED  PREGNANCY, URINE      Result Value Ref Range   Preg Test,  Ur NEGATIVE  NEGATIVE  HEPATIC FUNCTION PANEL      Result Value Ref Range   Total Protein 6.4  6.0 - 8.3 g/dL   Albumin 2.4 (*) 3.5 - 5.2 g/dL   AST 10  0 - 37 U/L   ALT 10  0 - 35 U/L   Alkaline Phosphatase 72  39 - 117 U/L   Total Bilirubin <0.2 (*) 0.3 - 1.2 mg/dL   Bilirubin, Direct <1.6  0.0 - 0.3 mg/dL   Indirect Bilirubin NOT CALCULATED  0.3 - 0.9 mg/dL  LIPASE, BLOOD      Result Value Ref Range   Lipase 39  11 - 59 U/L  TSH      Result Value Ref Range   TSH 4.450  0.350 - 4.500 uIU/mL  T4, FREE      Result Value Ref Range   Free T4 1.18  0.80 - 1.80 ng/dL  T3, FREE      Result Value Ref Range   T3, Free 3.0  2.3 - 4.2 pg/mL  TROPONIN I      Result Value Ref Range   Troponin I <0.30  <0.30 ng/mL  PRO B NATRIURETIC PEPTIDE      Result Value Ref Range   Pro B Natriuretic peptide (BNP) 1697.0 (*) 0 - 125 pg/mL  LEGIONELLA ANTIGEN, URINE      Result Value Ref Range   Specimen Description URINE, CATHETERIZED     Special Requests NONE     Legionella Antigen, Urine       Value: Negative for Legionella pneumophilia serogroup 1     Performed at [facility]    Report Status 05/31/2014 FINAL    STREP PNEUMONIAE URINARY ANTIGEN      Result Value Ref Range   Strep Pneumo Urinary Antigen NEGATIVE  NEGATIVE  COMPREHENSIVE METABOLIC PANEL      Result Value Ref Range   Sodium 138  137 - 147 mEq/L   Potassium 3.9  3.7 - 5.3 mEq/L   Chloride 103  96 - 112 mEq/L   CO2 24  19 - 32 mEq/L   Glucose, Bld 288 (*) 70 - 99 mg/dL   BUN 11  6 - 23 mg/dL   Creatinine, Ser 1.09  0.50 - 1.10 mg/dL   Calcium 8.4  8.4 - 60.4 mg/dL   Total Protein 6.3  6.0 - 8.3 g/dL   Albumin 2.4 (*) 3.5 - 5.2 g/dL   AST 9  0 - 37 U/L   ALT 10  0 - 35 U/L   Alkaline Phosphatase 69  39 - 117 U/L   Total Bilirubin <0.2 (*) 0.3 - 1.2 mg/dL   GFR calc non Af Amer 65 (*) >90 mL/min   GFR calc Af Amer 75 (*) >90 mL/min   Anion gap 11  5 - 15  CBC WITH DIFFERENTIAL      Result Value Ref Range   WBC 13.8 (*) 4.0 - 10.5 K/uL   RBC 4.42  3.87 - 5.11 MIL/uL   Hemoglobin 9.7 (*) 12.0 - 15.0 g/dL   HCT 54.0 (*) 98.1 - 19.1 %   MCV 71.0 (*) 78.0 - 100.0 fL   MCH 21.9 (*) 26.0 - 34.0 pg   MCHC 30.9  30.0 - 36.0 g/dL   RDW 47.8 (*) 29.5 - 62.1 %   Platelets 245  150 - 400 K/uL   Neutrophils Relative % 66  43 - 77 %   Lymphocytes Relative 29  12 -  46 %   Monocytes Relative 4  3 - 12 %   Eosinophils Relative 1  0 - 5 %   Basophils Relative 0  0 - 1 %   Neutro Abs 9.1 (*) 1.7 - 7.7 K/uL   Lymphs Abs 4.0  0.7 - 4.0 K/uL   Monocytes Absolute 0.6  0.1 - 1.0 K/uL   Eosinophils Absolute 0.1  0.0 - 0.7 K/uL   Basophils Absolute 0.0  0.0 - 0.1 K/uL  GLUCOSE, CAPILLARY      Result Value Ref Range   Glucose-Capillary 267 (*) 70 - 99 mg/dL  GLUCOSE, CAPILLARY      Result Value Ref Range   Glucose-Capillary 297 (*) 70 - 99 mg/dL   Comment 1 Documented in Chart     Comment 2 Notify RN    GLUCOSE, CAPILLARY      Result Value Ref Range   Glucose-Capillary 368 (*) 70 - 99 mg/dL   Comment 1 Documented in Chart     Comment 2 Notify RN    POC URINE PREG, ED      Result Value Ref Range    Preg Test, Ur NEGATIVE  NEGATIVE  I-STAT TROPOININ, ED      Result Value Ref Range   Troponin i, poc 0.04  0.00 - 0.08 ng/mL   Comment 3              EKG Interpretation   Date/Time:  Sunday May 30 2014 21:01:34 EDT Ventricular Rate:  122 PR Interval:  128 QRS Duration: 85 QT Interval:  344 QTC Calculation: 490 R Axis:   59 Text Interpretation:  Sinus tachycardia Consider right atrial enlargement  Borderline repolarization abnormality Borderline prolonged QT interval  Confirmed by Micheline Maze  MD, MEGAN ([id number]) on 05/30/2014 9:06:27 PM        Toy Cookey, MD 05/31/14 1308

## 2014-05-31 NOTE — Consult Note (Signed)
CARDIOLOGY CONSULT NOTE   Patient ID: Brittany Lopez MRN: [id number], DOB/AGE: [birth year]   Admit date: 05/30/2014 Date of Consult: 05/31/2014   Primary Physician: No PCP Per Patient Primary Cardiologist: New- Dr. Swaziland  Pt. Profile  38 year old [place] American female with past medical history significant for hypertension, diabetes and morbid obesity presented with diarrhea x 3 days and malignant hypertension. While in the ED, she began to have dyspnea and required nonrebreather.   Problem List  Past Medical History  Diagnosis Date  . Diabetes mellitus without complication   . Hypertension     Past Surgical History  Procedure Laterality Date  . Cesarean section       Allergies  No Known Allergies  HPI   The patient is a 38 year old African American female with past medical history significant for hypertension, diabetes and morbid obesity. She denies any prior history of cardiac problem nor has she seen a cardiologist before. She states she has been compliant with her medication. She states her blood pressure usually runs around 150s systolic at home, although sometimes it gets up to 200s. She was previously taking hydrochlorothiazide and lisinopril, however her PCPs stopped her hydrochlorothiazide recently (?no record of this in our system). According to our records, the last time she saw her PCP was on 03/29/2014, at which time she was still on hydrochlorothiazide 25 mg and lisinopril 20 mg daily. It is unclear as this time when did her hydrochlorothiazide was discontinued or cut back and why. According to patient, she also has chronic lower extremity swelling and she has been controlling it with hydrochlorothiazide and leg elevation at home. She states she sleeps on one pillow at elevated angle at night, however denies any prior history of paroxysmal nocturnal dyspnea. She denies any recent fever, chill, cough, and however she did work with someone who was recently sick last  week. She went to a friend's birthday party at a [place] steakhouse on 05/27/2014 and ordered some chicken and scallop. According to the patient, that same night, she began to have significant diarrhea. For the next few days, patient continued to have persistent diarrhea.  Patient presented to [facility] ED on 05/31/2014 for evaluation of diarrhea. On arrival to the ED, patient was noted to be severely hypertensive with systolic blood pressure of 194 to 209. Heart rate was tachycardic in the 110s. O2 saturation 98% on room air initially. While in the ED, patient was given 2 L of IV fluid for hydration. Short after, she started having shortness of breath. Her O2 saturation also dipped down to the 70s and she was placed on nonrebreather mask. A CTA of the chest was ordered which was negative for pulmonary emboli, however she did have severe bilateral alveolar opacification suggesting pulmonary edema versus ARDS. Patient was also noted to be tachycardic as well. She was placed on IV Lasix 40 mg twice a day. Echocardiogram was obtained which showed EF 50-55%, grade 1 diastolic dysfunction, cannot exclude hypokinesis of basal inferior myocardium, questionable moderate calcification involving the noncoronary and left coronary cusp and adjacent annulus. Cardiology has been consulted for potential diastolic heart failure and questionable Echo finding on aortic valve.  Inpatient Medications  . antiseptic oral rinse  7 mL Mouth Rinse q12n4p  . carvedilol  3.125 mg Oral BID WC  . chlorhexidine  15 mL Mouth Rinse BID  . enoxaparin (LOVENOX) injection  40 mg Subcutaneous Q24H  . furosemide  40 mg Intravenous BID  . insulin aspart  0-15 Units  Subcutaneous TID WC  . insulin aspart  0-5 Units Subcutaneous QHS  . insulin aspart  3 Units Subcutaneous TID WC  . insulin glargine  10 Units Subcutaneous Daily  . levofloxacin (LEVAQUIN) IV  750 mg Intravenous QHS  . lisinopril  20 mg Oral Daily  . nitroGLYCERIN  0.4 mg  Transdermal Daily  . sodium chloride  3 mL Intravenous Q12H    Family History Family History  Problem Relation Age of Onset  . Diabetes Mother   . Heart disease Mother   . Hypertension Mother   . Stroke Mother      Social History History   Social History  . Marital Status: Married    Spouse Name: N/A    Number of Children: N/A  . Years of Education: N/A   Occupational History  . Not on file.   Social History Main Topics  . Smoking status: Never Smoker   . Smokeless tobacco: Not on file  . Alcohol Use: No  . Drug Use: No  . Sexual Activity: Not on file   Other Topics Concern  . Not on file   Social History Narrative  . No narrative on file     Review of Systems  General:  No chills, fever, night sweats or weight changes.  Cardiovascular:  No chest pain, dyspnea on exertion, palpitations, paroxysmal nocturnal dyspnea. +edema, orthopnea Dermatological: No rash, lesions/masses Respiratory: No cough +dyspnea in the ED Urologic: No hematuria, dysuria Abdominal:   No nausea, vomiting, bright red blood per rectum, melena, or hematemesis +diarrhea Neurologic:  No visual changes, changes in mental status. +wkns related to recent diarrhea All other systems reviewed and are otherwise negative except as noted above.  Physical Exam  Blood pressure 153/113, pulse 97, temperature 98.3 F (36.8 C), temperature source Oral, resp. rate 19, height  (1.575 m), weight 300 lb (136.079 kg), last menstrual period 05/26/2014, SpO2 100.00%.  General: Pleasant, NAD, morbidly obese Psych: Normal affect. Neuro: Alert and oriented X 3. Moves all extremities spontaneously. HEENT: Normal  Neck: Supple without bruits or JVD. Lungs:  Resp regular and unlabored, CTA, diminished breath sound in bilateral basis Heart: mildly tachycardic no s3, s4, or murmurs. Abdomen: Soft, non-tender, non-distended, BS + x 4.  Extremities: No clubbing, cyanosis. DP/PT/Radials 2+ and equal bilaterally.  2+ nonpitting edema  Labs   Recent Labs  05/31/14 0334  TROPONINI <0.30   Lab Results  Component Value Date   WBC 13.8* 05/31/2014   HGB 9.7* 05/31/2014   HCT 31.4* 05/31/2014   MCV 71.0* 05/31/2014   PLT 245 05/31/2014    Recent Labs Lab 05/31/14 0334  NA 138  K 3.9  CL 103  CO2 24  BUN 11  CREATININE 1.07  CALCIUM 8.4  PROT 6.4  6.3  BILITOT <0.2*  <0.2*  ALKPHOS 72  69  ALT 10  10  AST 10  9  GLUCOSE 288*   Lab Results  Component Value Date   CHOL  Value: 123        ATP III CLASSIFICATION:  <200     mg/dL   Desirable  960-454  mg/dL   Borderline High  >=098    mg/dL   High        09/28/1476   HDL 32* 05/24/2010   LDLCALC  Value: 73        Total Cholesterol/HDL:CHD Risk Coronary Heart Disease Risk Table  Men   Women  1/2 Average Risk   3.4   3.3  Average Risk       5.0   4.4  2 X Average Risk   9.6   7.1  3 X Average Risk  23.4   11.0        Use the calculated Patient Ratio above and the CHD Risk Table to determine the patient's CHD Risk.        ATP III CLASSIFICATION (LDL):  <100     mg/dL   Optimal  295-621  mg/dL   Near or Above                    Optimal  130-159  mg/dL   Borderline  308-657  mg/dL   High  >846     mg/dL   Very High 9/62/9528   TRIG 91 05/24/2010   Lab Results  Component Value Date   DDIMER 1.27* 05/30/2014    Radiology/Studies  Dg Chest 2 View  05/30/2014   CLINICAL DATA:  Short of breath.  Weakness.  EXAM: CHEST  2 VIEW  COMPARISON:  05/23/2010.  FINDINGS: Cardiac silhouette is normal in size. Normal mediastinal and hilar contours.  There is irregular interstitial thickening that is most evident the right lung. Mild thickening is noted of the fissures. No focal lung consolidation. No pleural effusion or pneumothorax.  Bony thorax is intact.  IMPRESSION: 1. Irregular interstitial thickening most evident in the right lung. Findings could reflect asymmetric interstitial edema. Interstitial infiltrate, either infectious or  inflammatory, should be considered in the proper clinical setting. No evidence of lobar pneumonia. No pleural effusion.   Electronically Signed   By: Amie Portland M.D.   On: 05/30/2014 21:50   Ct Angio Chest Pe W/cm &/or Wo Cm  05/31/2014   CLINICAL DATA:  Chest pain, hypoxia for 3 days with elevated D-dimer and elevated white blood count  EXAM: CT ANGIOGRAPHY CHEST WITH CONTRAST  TECHNIQUE: Multidetector CT imaging of the chest was performed using the standard protocol during bolus administration of intravenous contrast. Multiplanar CT image reconstructions and MIPs were obtained to evaluate the vascular anatomy.  CONTRAST:  OMNIPAQUE IOHEXOL 350 MG/ML SOLN  COMPARISON:  Chest radiograph 05/30/2014  FINDINGS: There is significant limitation in the evaluation of the pulmonary arterial system, primarily due to patient body habitus. There are no large central pulmonary emboli. Pulmonary artery branches beyond the hila are very difficult to evaluate and small pulmonary arterial emboli cannot be excluded. The thoracic aorta is not dilated.  Evaluation of pulmonary arteries is further limited by the presence of extensive bilateral parenchymal alveolar opacification. This is same bilaterally in all lung zones, most severe in the central perihilar areas bilaterally.  There is minimal right pleural fluid. No pericardial effusion or left pleural effusion. There is mild cardiac enlargement.  Images through the upper abdomen are unremarkable. There are no acute musculoskeletal findings.  Review of the MIP images confirms the above findings.  IMPRESSION: Very limited evaluation of the pulmonary arterial system with no evidence of large central emboli.  Severe bilateral alveolar opacification. Distribution suggests the possibility acute severe pulmonary edema. Pulmonary hemorrhage and ARDS are also considerations.   Electronically Signed   By: Esperanza Heir M.D.   On: 05/31/2014 02:01    ECG  Sinus tachycardia  with heart rate 120s, LVH  ASSESSMENT AND PLAN  1. Acute on chronic diastolic HF  - in the setting of uncontrolled HTN  and 2 L IV fluid  - continue IV lasix, currently -2L, expect discontinue IV lasix tomorrow. Could transition to either low dose lasix vs HCTZ tomorrow  - need aggressive BP control, agree with addition of coreg, will increase coreg dose to 12.5 mg BID as her BP in 170s  2. ?AV calcification  - Dr. Swaziland reviewed the echo image, does not believe she need TEE at this time. A-febrile, low suspicion for endocarditis. Calcified lesion make endocarditis unlikely too  3. Uncontrolled HTN: need better control 4. Hypertension 5. Diabetes  6. morbid obesity  Signed, Azalee Course, PA-C 05/31/2014, 3:55 PM Patient seen and examined and history reviewed. Agree with above findings and plan. Brittany Lopez is a 38 yo BF with morbid obesity, DM, and severe HTN who presented with protracted diarrhea after eating [place] a [place] [employer] 2 days ago. In the ED she received 2 liters of IV fluid. She became acutely SOB. No chest pain. CT of the chest with contrast followed demonstrating acute pulmonary edema.  The patient was seen in July by Dr. Merla Riches. She was not taking her BP meds and BP was 180/110. She was started on lisinopril and HCTZ. She states BP at home has been at best in the 150s systolic and at worse over 200. She states that someone reduced her HCTZ 2 weeks age but there is no documentation of this.   I think her acute pulmonary edema is related to rapid volume expansion with IV fluids and contrast in the setting of hypertensive heart disease and uncontrolled BP. She is responding well to diuretics. Continue lisinopril. Will start Carvediolol 12.5 mg daily. Stressed importance of sodium restriction and compliance with medical therapy. Will likely need further titration of meds. Needs close follow up with primary care post DC.   I personally reviewed her Echo. Images are poor  related to her body habitus with BMI 55. I think there is some thickening of the raphe between the Left coronary and noncoronary cusps. I think the possibility of a mass is low. She has no evidence of stenosis or regurgitation by exam or Echo. I would not pursue this in the current clinical scenario.   Peter Swaziland, MDFACC 05/31/2014 4:30 PM

## 2014-05-31 NOTE — Progress Notes (Signed)
  Echocardiogram 2D Echocardiogram has been performed.  Brittany Lopez 05/31/2014, 10:55 AM

## 2014-05-31 NOTE — ED Notes (Signed)
Patient transported to CT 

## 2014-05-31 NOTE — H&P (Signed)
[facility] History and Physical  Eadie Repetto Treto ZOX:[id number] DOB: [birth year] DOA: 05/30/2014  Referring physician: ER physician. PCP: No PCP Per Patient   Chief Complaint: Diarrhea and weakness.  HPI: Brittany Lopez is a 38 y.o. female with history of hypertension and diabetes mellitus presents to the ER because of diarrhea and weakness. Patient states that she's been having diarrhea for last 3-4 days. Diarrhea has been nonbloody and no associated nausea vomiting abdominal pain. Denies any fever chills or any recent travel. Patient states she may have come in contact with someone sick last week. In addition patient has been having some nonproductive cough over the last one week. In the ER patient started becoming short of breath and required 100% nonrebreather. Since patient's d-dimer was elevated patient had CT abdomen of the chest which was negative for PE but did show bilateral infiltrates concerning for fluid overload versus ARDS. Patient will be admitted for further management. Patient denies any chest pain headache visual symptoms. Patient's blood pressure was found to be elevated.   Review of Systems: As presented in the history of presenting illness, rest negative.  Past Medical History  Diagnosis Date  . Diabetes mellitus without complication   . Hypertension    Past Surgical History  Procedure Laterality Date  . Cesarean section     Social History:  reports that she has never smoked. She does not have any smokeless tobacco history on file. She reports that she does not drink alcohol or use illicit drugs. Where does patient live home. Can patient participate in ADLs? Yes.  No Known Allergies  Family History:  Family History  Problem Relation Age of Onset  . Diabetes Mother   . Heart disease Mother   . Hypertension Mother   . Stroke Mother       Prior to Admission medications   Medication Sig Start Date End Date Taking? Authorizing Provider  lisinopril  (PRINIVIL,ZESTRIL) 20 MG tablet Take 1 tablet (20 mg total) by mouth daily. 03/29/14  Yes Emi Belfast, FNP  metFORMIN (GLUCOPHAGE) 1000 MG tablet Take 1 tablet (1,000 mg total) by mouth 2 (two) times daily with a meal. 03/29/14  Yes Emi Belfast, FNP    Physical Exam: Filed Vitals:   05/30/14 2345 05/31/14 0014 05/31/14 0100 05/31/14 0115  BP:   187/117 197/104  Pulse:   98 105  Temp:      TempSrc:      Resp:      SpO2: 77% 100% 100% 100%     General:  Obese not in distress.  Eyes: Anicteric no pallor.  ENT: No discharge from ears eyes nose mouth.  Neck: JVD not appreciated. No mass felt.  Cardiovascular: S1-S2 heard.  Respiratory: No rhonchi or crepitations.  Abdomen: Soft nontender bowel sounds present. No guarding or rigidity.  Skin: No rash.  Musculoskeletal: No edema.  Psychiatric: Appears normal.  Neurologic: Alert awake oriented to time place and person. Moves all extremities.  Labs on Admission:  Basic Metabolic Panel:  Recent Labs Lab 05/30/14 2002  NA 140  K 4.1  CL 103  CO2 24  GLUCOSE 261*  BUN 12  CREATININE 1.13*  CALCIUM 9.3   Liver Function Tests: No results found for this basename: AST, ALT, ALKPHOS, BILITOT, PROT, ALBUMIN,  in the last 168 hours No results found for this basename: LIPASE, AMYLASE,  in the last 168 hours No results found for this basename: AMMONIA,  in the last 168 hours CBC:  Recent  Labs Lab 05/30/14 2002  WBC 13.2*  HGB 10.8*  HCT 33.7*  MCV 69.1*  PLT 239   Cardiac Enzymes: No results found for this basename: CKTOTAL, CKMB, CKMBINDEX, TROPONINI,  in the last 168 hours  BNP (last 3 results) No results found for this basename: PROBNP,  in the last 8760 hours CBG: No results found for this basename: GLUCAP,  in the last 168 hours  Radiological Exams on Admission: Dg Chest 2 View  05/30/2014   CLINICAL DATA:  Short of breath.  Weakness.  EXAM: CHEST  2 VIEW  COMPARISON:  05/23/2010.  FINDINGS:  Cardiac silhouette is normal in size. Normal mediastinal and hilar contours.  There is irregular interstitial thickening that is most evident the right lung. Mild thickening is noted of the fissures. No focal lung consolidation. No pleural effusion or pneumothorax.  Bony thorax is intact.  IMPRESSION: 1. Irregular interstitial thickening most evident in the right lung. Findings could reflect asymmetric interstitial edema. Interstitial infiltrate, either infectious or inflammatory, should be considered in the proper clinical setting. No evidence of lobar pneumonia. No pleural effusion.   Electronically Signed   By: Amie Portland M.D.   On: 05/30/2014 21:50   Ct Angio Chest Pe W/cm &/or Wo Cm  05/31/2014   CLINICAL DATA:  Chest pain, hypoxia for 3 days with elevated D-dimer and elevated white blood count  EXAM: CT ANGIOGRAPHY CHEST WITH CONTRAST  TECHNIQUE: Multidetector CT imaging of the chest was performed using the standard protocol during bolus administration of intravenous contrast. Multiplanar CT image reconstructions and MIPs were obtained to evaluate the vascular anatomy.  CONTRAST:  OMNIPAQUE IOHEXOL 350 MG/ML SOLN  COMPARISON:  Chest radiograph 05/30/2014  FINDINGS: There is significant limitation in the evaluation of the pulmonary arterial system, primarily due to patient body habitus. There are no large central pulmonary emboli. Pulmonary artery branches beyond the hila are very difficult to evaluate and small pulmonary arterial emboli cannot be excluded. The thoracic aorta is not dilated.  Evaluation of pulmonary arteries is further limited by the presence of extensive bilateral parenchymal alveolar opacification. This is same bilaterally in all lung zones, most severe in the central perihilar areas bilaterally.  There is minimal right pleural fluid. No pericardial effusion or left pleural effusion. There is mild cardiac enlargement.  Images through the upper abdomen are unremarkable. There are  no acute musculoskeletal findings.  Review of the MIP images confirms the above findings.  IMPRESSION: Very limited evaluation of the pulmonary arterial system with no evidence of large central emboli.  Severe bilateral alveolar opacification. Distribution suggests the possibility acute severe pulmonary edema. Pulmonary hemorrhage and ARDS are also considerations.   Electronically Signed   By: Esperanza Heir M.D.   On: 05/31/2014 02:01    EKG: Independently reviewed. Sinus tachycardia.  Assessment/Plan Principal Problem:   Acute respiratory failure with hypoxia Active Problems:   Type 2 diabetes mellitus with hyperglycemia   Uncontrolled hypertension   1. Acute hypoxic respiratory failure - at this time suspect probable pulmonary edema from fluid versus developing ARDS versus hemorrhage. Patient denies any hemoptysis. At this time I have ordered Lasix 40 mg IV and place patient on empiric antibiotics. Patient pressure is also found to be uncontrolled should also be contributing to patient's shortness of breath. Patient will be closely observed in step down unit. Check urine for Legionella strep antigen and check respiratory viral panel. Check BNP and 2-D echo. Get ABG and drug screen. Since patient is tachycardic  check thyroid function tests. 2. Accelerated hypertension - patient has been placed on nitroglycerin patch and when necessary IV labetalol. Continue home medications of lisinopril. 3. Diarrhea - check stool for culture and C. Difficile. 4. Diabetes mellitus type 2 uncontrolled - check hemoglobin A1c and presently have placed patient on sliding-scale coverage. Metformin on hold as patient has received contrast.    Code Status: Full code.  Family Communication: Patient's husband at the bedside.  Disposition Plan: Admit to inpatient.    Sadhana Frater N. [facility] Pager [phone].  If 7PM-7AM, please contact night-coverage [email or web address] Password [email or web address] 05/31/2014, 2:11  AM

## 2014-06-01 ENCOUNTER — Inpatient Hospital Stay (HOSPITAL_COMMUNITY): Payer: Self-pay

## 2014-06-01 DIAGNOSIS — R000 Tachycardia, unspecified: Secondary | ICD-10-CM

## 2014-06-01 DIAGNOSIS — R0902 Hypoxemia: Secondary | ICD-10-CM

## 2014-06-01 DIAGNOSIS — I5031 Acute diastolic (congestive) heart failure: Secondary | ICD-10-CM

## 2014-06-01 LAB — RESPIRATORY VIRUS PANEL
ADENOVIRUS: NOT DETECTED
INFLUENZA A H1: NOT DETECTED
Influenza A H3: NOT DETECTED
Influenza A: NOT DETECTED
Influenza B: NOT DETECTED
METAPNEUMOVIRUS: NOT DETECTED
PARAINFLUENZA 2 A: NOT DETECTED
PARAINFLUENZA 3 A: NOT DETECTED
Parainfluenza 1: NOT DETECTED
RHINOVIRUS: NOT DETECTED
Respiratory Syncytial Virus A: NOT DETECTED
Respiratory Syncytial Virus B: NOT DETECTED

## 2014-06-01 LAB — BASIC METABOLIC PANEL
Anion gap: 12 (ref 5–15)
BUN: 16 mg/dL (ref 6–23)
CO2: 24 meq/L (ref 19–32)
Calcium: 8.7 mg/dL (ref 8.4–10.5)
Chloride: 99 mEq/L (ref 96–112)
Creatinine, Ser: 1.28 mg/dL — ABNORMAL HIGH (ref 0.50–1.10)
GFR calc Af Amer: 61 mL/min — ABNORMAL LOW (ref >90)
GFR, EST NON AFRICAN AMERICAN: 52 mL/min — AB (ref >90)
GLUCOSE: 300 mg/dL — AB (ref 70–99)
POTASSIUM: 3.9 meq/L (ref 3.7–5.3)
Sodium: 135 mEq/L — ABNORMAL LOW (ref 137–147)

## 2014-06-01 LAB — CBC
HEMATOCRIT: 31.2 % — AB (ref 36.0–46.0)
HEMOGLOBIN: 9.8 g/dL — AB (ref 12.0–15.0)
MCH: 22.3 pg — ABNORMAL LOW (ref 26.0–34.0)
MCHC: 31.4 g/dL (ref 30.0–36.0)
MCV: 70.9 fL — AB (ref 78.0–100.0)
PLATELETS: 237 10*3/uL (ref 150–400)
RBC: 4.4 MIL/uL (ref 3.87–5.11)
RDW: 16.8 % — ABNORMAL HIGH (ref 11.5–15.5)
WBC: 13.3 10*3/uL — AB (ref 4.0–10.5)

## 2014-06-01 LAB — GLUCOSE, CAPILLARY
Glucose-Capillary: 172 mg/dL — ABNORMAL HIGH (ref 70–99)
Glucose-Capillary: 291 mg/dL — ABNORMAL HIGH (ref 70–99)
Glucose-Capillary: 172 mg/dL — ABNORMAL HIGH (ref 70–99)
Glucose-Capillary: 151 mg/dL — ABNORMAL HIGH (ref 70–99)

## 2014-06-01 LAB — MRSA CULTURE

## 2014-06-01 LAB — PRO B NATRIURETIC PEPTIDE: Pro B Natriuretic peptide (BNP): 753.9 pg/mL — ABNORMAL HIGH (ref 0–125)

## 2014-06-01 LAB — CLOSTRIDIUM DIFFICILE BY PCR: CDIFFPCR: NEGATIVE

## 2014-06-01 MED ORDER — INSULIN GLARGINE 100 UNIT/ML ~~LOC~~ SOLN
15.0000 [IU] | Freq: Every day | SUBCUTANEOUS | Status: DC
  Filled 2014-06-01: qty 0.15

## 2014-06-01 MED ORDER — FUROSEMIDE 10 MG/ML IJ SOLN
60.0000 mg | Freq: Two times a day (BID) | INTRAMUSCULAR | Status: DC
  Administered 2014-06-01: 60 mg via INTRAVENOUS
  Filled 2014-06-01: qty 6

## 2014-06-01 MED ORDER — ZOLPIDEM TARTRATE 5 MG PO TABS
5.0000 mg | ORAL_TABLET | Freq: Once | ORAL | Status: AC
  Administered 2014-06-01: 5 mg via ORAL
  Filled 2014-06-01: qty 1

## 2014-06-01 MED ORDER — INSULIN ASPART 100 UNIT/ML ~~LOC~~ SOLN
5.0000 [IU] | Freq: Three times a day (TID) | SUBCUTANEOUS | Status: DC
  Administered 2014-06-01: 5 [IU] via SUBCUTANEOUS

## 2014-06-01 NOTE — Progress Notes (Signed)
Patient Name: Brittany Lopez Date of Encounter: 06/01/2014  Principal Problem:   Acute respiratory failure with hypoxia Active Problems:   Type 2 diabetes mellitus with hyperglycemia   Uncontrolled hypertension   Length of Stay: 2  SUBJECTIVE  The patient states that her breathing has improved, minimal cough.   CURRENT MEDS . antiseptic oral rinse  7 mL Mouth Rinse q12n4p  . carvedilol  12.5 mg Oral BID WC  . chlorhexidine  15 mL Mouth Rinse BID  . enoxaparin (LOVENOX) injection  40 mg Subcutaneous Q24H  . furosemide  40 mg Intravenous BID  . insulin aspart  0-15 Units Subcutaneous TID WC  . insulin aspart  0-5 Units Subcutaneous QHS  . insulin aspart  3 Units Subcutaneous TID WC  . insulin glargine  10 Units Subcutaneous Daily  . levofloxacin (LEVAQUIN) IV  750 mg Intravenous QHS  . lisinopril  20 mg Oral Daily  . nitroGLYCERIN  0.4 mg Transdermal Daily  . sodium chloride  3 mL Intravenous Q12H    OBJECTIVE  Filed Vitals:   06/01/14 0500 06/01/14 0600 06/01/14 0700 06/01/14 0800  BP: 156/81 144/70 150/75   Pulse: 84 82 82   Temp:    98.9 F (37.2 C)  TempSrc:    Oral  Resp: 18 32 29   Height:      Weight:      SpO2: 94% 92% 92%     Intake/Output Summary (Last 24 hours) at 06/01/14 0841 Last data filed at 06/01/14 0811  Gross per 24 hour  Intake      0 ml  Output   1750 ml  Net  -1750 ml   Filed Weights   05/31/14 0234  Weight: 300 lb (136.079 kg)    PHYSICAL EXAM  General: Pleasant, NAD. Neuro: Alert and oriented X 3. Moves all extremities spontaneously. Psych: Normal affect. HEENT:  Normal  Neck: Supple without bruits or JVD. Lungs:  Resp regular and unlabored, bilateral crackles up to mid lungs. Heart: RRR no s3, s4, or murmurs. Abdomen: Soft, non-tender, non-distended, BS + x 4.  Extremities: No clubbing, cyanosis or edema. DP/PT/Radials 2+ and equal bilaterally.  Accessory Clinical Findings  CBC  Recent Labs  05/30/14 2002  05/31/14 0334  WBC 13.2* 13.8*  NEUTROABS  --  9.1*  HGB 10.8* 9.7*  HCT 33.7* 31.4*  MCV 69.1* 71.0*  PLT 239 245   Basic Metabolic Panel  Recent Labs  05/30/14 2002 05/31/14 0334  NA 140 138  K 4.1 3.9  CL 103 103  CO2 24 24  GLUCOSE 261* 288*  BUN 12 11  CREATININE 1.13* 1.07  CALCIUM 9.3 8.4   Liver Function Tests  Recent Labs  05/31/14 0334  AST 10  9  ALT 10  10  ALKPHOS 72  69  BILITOT <0.2*  <0.2*  PROT 6.4  6.3  ALBUMIN 2.4*  2.4*    Recent Labs  05/31/14 0334  LIPASE 39   Cardiac Enzymes  Recent Labs  05/31/14 0334  TROPONINI <0.30   BNP No components found with this basename: POCBNP,  D-Dimer  Recent Labs  05/30/14 2101  DDIMER 1.27*   Hemoglobin A1C  Recent Labs  05/31/14 0334  HGBA1C 10.3*   Fasting Lipid Panel No results found for this basename: CHOL, HDL, LDLCALC, TRIG, CHOLHDL, LDLDIRECT,  in the last 72 hours Thyroid Function Tests  Recent Labs  05/31/14 0334  TSH 4.450  T3FREE 3.0    Radiology/Studies  Dg Chest 2 View  06/01/2014   CLINICAL DATA:  CHF, followup, history hypertension, diabetes  EXAM: CHEST  2 VIEW  COMPARISON:  05/30/2014  FINDINGS: Enlargement of cardiac silhouette with pulmonary vascular congestion.  Improved interstitial edema since previous exam.  No segmental consolidation, pleural effusion or pneumothorax.  Bones unremarkable.  IMPRESSION: Improved pulmonary edema.   Electronically Signed   By: Ulyses Southward M.D.   On: 06/01/2014 08:37   Ct Angio Chest Pe W/cm &/or Wo Cm  05/31/2014   CLINICAL DATA:  Chest pain, hypoxia for 3 days with elevated D-dimer and elevated white blood count   IMPRESSION: Very limited evaluation of the pulmonary arterial system with no evidence of large central emboli.  Severe bilateral alveolar opacification. Distribution suggests the possibility acute severe pulmonary edema. Pulmonary hemorrhage and ARDS are also considerations.     TELE: SR  ECG:   ECHO  05/31/2014 Left ventricle: The cavity size was normal. Wall thickness was increased in a pattern of mild LVH. Systolic function was normal. The estimated ejection fraction was in the range of 50% to 55%. Cannot exclude hypokinesis of the basalinferior myocardium. Doppler parameters are consistent with abnormal left ventricular relaxation (grade 1 diastolic dysfunction). - Aortic valve: Probably trileaflet. Appears to be an area of moderate calcification involving the noncoronary or left coronary cusp and adjacent annulus. Not well seen, cannot completely exclude a calcified vegetation or mass. This could be further visualized by TEE, if felt to be clinically appropriate. Cusp separation was normal. There was no significant regurgitation. - Left atrium: The atrium was at the upper limits of normal in size. - Right atrium: Central venous pressure (est): 3 mm Hg. - Tricuspid valve: There was trivial regurgitation. - Pulmonary arteries: Systolic pressure could not be accurately estimated. - Pericardium, extracardiac: There was no pericardial effusion.  Impressions:  - Limited images overall. Mild LVH with LVEF approximately 50-55%, cannot exclude inferior hypokinesis, grade 1 diastolic dysfunction. Appears to be an area of moderate calcification involving the noncoronary or left coronary cusp and adjacent annulus. Not well seen, cannot completely exclude a calcified vegetation or mass. This could be further visualized by TEE, if felt to be clinically appropriate. Trivial tricuspid regurgitation, unable to assess PASP.     ASSESSMENT AND PLAN  38 year old [place] American female with past medical history significant for hypertension, diabetes and morbid obesity presented with diarrhea x 3 days and malignant hypertension. While in the ED, she began to have dyspnea and required nonrebreather.   1. Acute on chronic diastolic HF  - in the setting of uncontrolled HTN and 2 L IV fluid  -  negative 1.7 liter since yesterday with clinical improvement - stable Crea, improved - continue IV lasix,increase to 80 mg iv BID - need aggressive BP control, increase lasix  2. ?AV calcification  - Dr. [place] reviewed the echo image, does not believe she need TEE at this time. A-febrile, low suspicion for endocarditis. Calcified lesion make endocarditis unlikely too   3. Uncontrolled HTN: as above, better HR control today with coreg, possible uptitration of coreg if still hypertensive tomorrow 4. Diabetes - poorly controlled, HbA1c 10.3% 5. morbid obesity  Signed, Lars Masson MD, [facility] 06/01/2014

## 2014-06-01 NOTE — Progress Notes (Signed)
PROGRESS NOTE    Brittany Lopez WUJ:[id number] DOB: 04-02-1976 DOA: 05/30/2014 PCP: No PCP Per Patient at [facility].  HPI/Brief narrative 38 year old female patient with history of DM 2, HTN, presented to the ER with complaints of diarrhea and weakness. She states that her symptoms started a couple of hours after eating at a [place] [employer] on 05/27/14. She denied nausea, vomiting, fever, chills or abdominal pain. In the ED, patient started having dyspnea. D-dimer was positive. CTA chest was negative for PE but suggested findings of pulmonary edema versus ARDS. She was admitted to step down unit for further management.    Assessment/Plan:  1. Diarrhea: Likely acute viral GE versus food poisoning. Resolved after supportive treatment. Tolerating diet. C. difficile PCR negative. Followup GI pathogen panel PCR and stool culture results. 2. Acute diastolic CHF: Precipitated by IVF and uncontrolled hypertension. Treated with IV Lasix and improving. 2-D echo shows LVEF 50-55%, cannot exclude inferior hypokinesis, grade 1 diastolic dysfunction and cannot exclude vegetations/mass or noncoronary or left coronary cusp. -3.2 L since admission. Improving. Cardiology input appreciated. Given her mild bump in creatinine, will only increase Lasix to 60 mg every 12. Follow creatinine closely. 3. Acute respiratory failure with hypoxia: Likely precipitated by problem #2. CTA chest: No large PE. Resolved. No clinical pneumonia. DC levofloxacin. Chest x-ray improved. 4. Accelerated hypertension: Continue home lisinopril 20 mg daily. Added carvedilol 12.5 mg twice a day. When necessary IV labetalol. Will need tighter control of BP on the long run. 5. Uncontrolled DM 2: Metformin held. Start low-dose Lantus and continue NovoLog SSI. A1c: 10.3. May likely need insulins at discharge at least on the short term. 6. Anemia: Possibly chronic. Stable 7. Mild leukocytosis: Likely stress response. Stable 8. ?  AV calcification: As per cardiology input, echo images were reviewed and do not believe that she needs TEE at this time. Low index of suspicion for endocarditis. Calcified lesion makes endocarditis unlikely too. 9. Morbid obesity  Code Status: Full Family Communication: None at bedside Disposition Plan: Transfer to telemetry   Consultants:  None  Procedures:  Foley-DC'd 9/21  Antibiotics:  IV levofloxacin 9/21 > 9/22   Subjective: No diarrhea, abdominal pain, nausea or vomiting. Tolerated diet. No dyspnea. Able to come off oxygen. Denies cough or chest pain. Urinating well.  Objective: Filed Vitals:   06/01/14 0600 06/01/14 0700 06/01/14 0800 06/01/14 0900  BP: 144/70 150/75  172/97  Pulse: 82 82  99  Temp:   98.9 F (37.2 C)   TempSrc:   Oral   Resp: 32 29  24  Height:      Weight:      SpO2: 92% 92%  96%    Intake/Output Summary (Last 24 hours) at 06/01/14 1407 Last data filed at 06/01/14 1000  Gross per 24 hour  Intake      0 ml  Output    800 ml  Net   -800 ml   Filed Weights   05/31/14 0234  Weight: 136.079 kg (300 lb)     Exam:  General exam: Moderately built and morbidly obese female sitting up comfortably in chair. Respiratory system: Occasional basal crackles but otherwise clear to auscultation. No wheezing or rhonchi. No increased work of breathing. Cardiovascular system: S1 & S2 heard, RRR. No JVD, murmurs, gallops, clicks. Trace bilateral leg edema.  Gastrointestinal system: Abdomen is nondistended, soft and nontender. Normal bowel sounds heard. Central nervous system: Alert and oriented. No focal neurological deficits. Extremities: Symmetric 5 x 5  power.   Data Reviewed: Basic Metabolic Panel:  Recent Labs Lab 05/30/14 2002 05/31/14 0334 06/01/14 1001  NA 140 138 135*  K 4.1 3.9 3.9  CL 103 103 99  CO2 GLUCOSE 261* 288* 300*  BUN CREATININE 1.13* 1.07 1.28*  CALCIUM 9.3 8.4 8.7   Liver Function  Tests:  Recent Labs Lab 05/31/14 0334  AST 10  9  ALT 10  10  ALKPHOS 72  69  BILITOT <0.2*  <0.2*  PROT 6.4  6.3  ALBUMIN 2.4*  2.4*    Recent Labs Lab 05/31/14 0334  LIPASE 39   No results found for this basename: AMMONIA,  in the last 168 hours CBC:  Recent Labs Lab 05/30/14 2002 05/31/14 0334 06/01/14 1001  WBC 13.2* 13.8* 13.3*  NEUTROABS  --  9.1*  --   HGB 10.8* 9.7* 9.8*  HCT 33.7* 31.4* 31.2*  MCV 69.1* 71.0* 70.9*  PLT 239 245 237   Cardiac Enzymes:  Recent Labs Lab 05/31/14 0334  TROPONINI <0.30   BNP (last 3 results)  Recent Labs  05/31/14 0334 06/01/14 1001  PROBNP 1697.0* 753.9*   CBG:  Recent Labs Lab 05/31/14 1217 05/31/14 1555 05/31/14 2129 06/01/14 0814 06/01/14 1143  GLUCAP 368* 263* 164* 172* 291*    Recent Results (from the past 240 hour(s))  MRSA PCR SCREENING     Status: Abnormal   Collection Time    05/31/14  3:34 AM      Result Value Ref Range Status   MRSA by PCR INVALID RESULTS, SPECIMEN SENT FOR CULTURE (*) NEGATIVE Final   Comment: NOTIFIED M. REEVES RN AT 0540 ON 09.21.15 BY SHUEA                The GeneXpert MRSA Assay (FDA     approved for NASAL specimens     only), is one component of a     comprehensive MRSA colonization     surveillance program. It is not     intended to diagnose MRSA     infection nor to guide or     monitor treatment for     MRSA infections.  MRSA CULTURE     Status: None   Collection Time    05/31/14  3:34 AM      Result Value Ref Range Status   Specimen Description NOSE   Final   Special Requests NONE   Final   Culture     Final   Value: FEW STAPHYLOCOCCUS AUREUS     Note: NOMRSA     Performed at [facility]   Report Status 06/01/2014 FINAL   Final  CLOSTRIDIUM DIFFICILE BY PCR     Status: None   Collection Time    05/31/14  8:07 PM      Result Value Ref Range Status   C difficile by pcr NEGATIVE  NEGATIVE Final   Comment: Performed at [facility]        Additional labs: 1. 2 D Echo: Impressions:  - Limited images overall. Mild LVH with LVEF approximately 50-55%, cannot exclude inferior hypokinesis, grade 1 diastolic dysfunction. Appears to be an area of moderate calcification involving the noncoronary or left coronary cusp and adjacent annulus. Not well seen, cannot completely exclude a calcified vegetation or mass. This could be further visualized by TEE, if felt to be clinically appropriate. Trivial tricuspid regurgitation, unable to assess PASP.      Studies:  Dg Chest 2 View  06/01/2014   CLINICAL DATA:  CHF, followup, history hypertension, diabetes  EXAM: CHEST  2 VIEW  COMPARISON:  05/30/2014  FINDINGS: Enlargement of cardiac silhouette with pulmonary vascular congestion.  Improved interstitial edema since previous exam.  No segmental consolidation, pleural effusion or pneumothorax.  Bones unremarkable.  IMPRESSION: Improved pulmonary edema.   Electronically Signed   By: Ulyses Southward M.D.   On: 06/01/2014 08:37   Dg Chest 2 View  05/30/2014   CLINICAL DATA:  Short of breath.  Weakness.  EXAM: CHEST  2 VIEW  COMPARISON:  05/23/2010.  FINDINGS: Cardiac silhouette is normal in size. Normal mediastinal and hilar contours.  There is irregular interstitial thickening that is most evident the right lung. Mild thickening is noted of the fissures. No focal lung consolidation. No pleural effusion or pneumothorax.  Bony thorax is intact.  IMPRESSION: 1. Irregular interstitial thickening most evident in the right lung. Findings could reflect asymmetric interstitial edema. Interstitial infiltrate, either infectious or inflammatory, should be considered in the proper clinical setting. No evidence of lobar pneumonia. No pleural effusion.   Electronically Signed   By: Amie Portland M.D.   On: 05/30/2014 21:50   Ct Angio Chest Pe W/cm &/or Wo Cm  05/31/2014   CLINICAL DATA:  Chest pain, hypoxia for 3 days with elevated D-dimer and elevated  white blood count  EXAM: CT ANGIOGRAPHY CHEST WITH CONTRAST  TECHNIQUE: Multidetector CT imaging of the chest was performed using the standard protocol during bolus administration of intravenous contrast. Multiplanar CT image reconstructions and MIPs were obtained to evaluate the vascular anatomy.  CONTRAST:  OMNIPAQUE IOHEXOL 350 MG/ML SOLN  COMPARISON:  Chest radiograph 05/30/2014  FINDINGS: There is significant limitation in the evaluation of the pulmonary arterial system, primarily due to patient body habitus. There are no large central pulmonary emboli. Pulmonary artery branches beyond the hila are very difficult to evaluate and small pulmonary arterial emboli cannot be excluded. The thoracic aorta is not dilated.  Evaluation of pulmonary arteries is further limited by the presence of extensive bilateral parenchymal alveolar opacification. This is same bilaterally in all lung zones, most severe in the central perihilar areas bilaterally.  There is minimal right pleural fluid. No pericardial effusion or left pleural effusion. There is mild cardiac enlargement.  Images through the upper abdomen are unremarkable. There are no acute musculoskeletal findings.  Review of the MIP images confirms the above findings.  IMPRESSION: Very limited evaluation of the pulmonary arterial system with no evidence of large central emboli.  Severe bilateral alveolar opacification. Distribution suggests the possibility acute severe pulmonary edema. Pulmonary hemorrhage and ARDS are also considerations.   Electronically Signed   By: Esperanza Heir M.D.   On: 05/31/2014 02:01        Scheduled Meds: . antiseptic oral rinse  7 mL Mouth Rinse q12n4p  . carvedilol  12.5 mg Oral BID WC  . chlorhexidine  15 mL Mouth Rinse BID  . enoxaparin (LOVENOX) injection  40 mg Subcutaneous Q24H  . furosemide  60 mg Intravenous BID  . insulin aspart  0-15 Units Subcutaneous TID WC  . insulin aspart  0-5 Units Subcutaneous QHS  .  insulin aspart  3 Units Subcutaneous TID WC  . insulin glargine  10 Units Subcutaneous Daily  . lisinopril  20 mg Oral Daily  . nitroGLYCERIN  0.4 mg Transdermal Daily  . sodium chloride  3 mL Intravenous Q12H   Continuous Infusions: . sodium chloride  10 mL/hr at 05/31/14 1610    Principal Problem:   Acute respiratory failure with hypoxia Active Problems:   Type 2 diabetes mellitus with hyperglycemia   Uncontrolled hypertension   Acute diastolic CHF (congestive heart failure), NYHA class 3    Time spent: 45 minutes    Rye Decoste, MD, FACP, FHM. [facility] Pager [phone]  If 7PM-7AM, please contact night-coverage [email or web address] Password [email or web address] 06/01/2014, 2:07 PM    LOS: 2 days

## 2014-06-02 LAB — GLUCOSE, CAPILLARY
GLUCOSE-CAPILLARY: 199 mg/dL — AB (ref 70–99)
GLUCOSE-CAPILLARY: 148 mg/dL — AB (ref 70–99)
Glucose-Capillary: 275 mg/dL — ABNORMAL HIGH (ref 70–99)
Glucose-Capillary: 269 mg/dL — ABNORMAL HIGH (ref 70–99)

## 2014-06-02 LAB — OVA AND PARASITE EXAMINATION: Ova and parasites: NONE SEEN

## 2014-06-02 LAB — BASIC METABOLIC PANEL
ANION GAP: 11 (ref 5–15)
BUN: 21 mg/dL (ref 6–23)
CALCIUM: 8.5 mg/dL (ref 8.4–10.5)
CO2: 27 mEq/L (ref 19–32)
CREATININE: 1.44 mg/dL — AB (ref 0.50–1.10)
Chloride: 100 mEq/L (ref 96–112)
GFR calc Af Amer: 53 mL/min — ABNORMAL LOW (ref >90)
GFR calc non Af Amer: 45 mL/min — ABNORMAL LOW (ref >90)
Glucose, Bld: 248 mg/dL — ABNORMAL HIGH (ref 70–99)
Potassium: 3.5 mEq/L — ABNORMAL LOW (ref 3.7–5.3)
Sodium: 138 mEq/L (ref 137–147)

## 2014-06-02 MED ORDER — LIVING WELL WITH DIABETES BOOK
Freq: Once | Status: AC
  Administered 2014-06-02: 15:00:00
  Filled 2014-06-02: qty 1

## 2014-06-02 MED ORDER — FUROSEMIDE 40 MG PO TABS
40.0000 mg | ORAL_TABLET | Freq: Two times a day (BID) | ORAL | Status: DC
  Administered 2014-06-02 – 2014-06-04 (×4): 40 mg via ORAL
  Filled 2014-06-02 (×7): qty 1

## 2014-06-02 MED ORDER — LISINOPRIL 20 MG PO TABS
20.0000 mg | ORAL_TABLET | Freq: Every day | ORAL | Status: DC
  Filled 2014-06-02: qty 1

## 2014-06-02 MED ORDER — INSULIN ASPART 100 UNIT/ML ~~LOC~~ SOLN
0.0000 [IU] | Freq: Three times a day (TID) | SUBCUTANEOUS | Status: DC
  Administered 2014-06-02: 5 [IU] via SUBCUTANEOUS
  Administered 2014-06-03: 2 [IU] via SUBCUTANEOUS
  Administered 2014-06-03 – 2014-06-04 (×3): 1 [IU] via SUBCUTANEOUS

## 2014-06-02 MED ORDER — AMLODIPINE BESYLATE 5 MG PO TABS
5.0000 mg | ORAL_TABLET | Freq: Every day | ORAL | Status: DC
  Administered 2014-06-02: 5 mg via ORAL
  Filled 2014-06-02 (×2): qty 1

## 2014-06-02 MED ORDER — INSULIN ASPART PROT & ASPART (70-30 MIX) 100 UNIT/ML ~~LOC~~ SUSP
20.0000 [IU] | Freq: Two times a day (BID) | SUBCUTANEOUS | Status: DC
  Administered 2014-06-02: 20 [IU] via SUBCUTANEOUS
  Filled 2014-06-02: qty 10

## 2014-06-02 MED ORDER — INSULIN ASPART PROT & ASPART (70-30 MIX) 100 UNIT/ML ~~LOC~~ SUSP
25.0000 [IU] | Freq: Two times a day (BID) | SUBCUTANEOUS | Status: DC
  Administered 2014-06-02 – 2014-06-04 (×4): 25 [IU] via SUBCUTANEOUS
  Filled 2014-06-02: qty 10

## 2014-06-02 MED ORDER — POTASSIUM CHLORIDE CRYS ER 20 MEQ PO TBCR
30.0000 meq | EXTENDED_RELEASE_TABLET | Freq: Once | ORAL | Status: AC
  Administered 2014-06-02: 30 meq via ORAL
  Filled 2014-06-02 (×2): qty 1

## 2014-06-02 MED ORDER — INSULIN STARTER KIT- SYRINGES (ENGLISH)
1.0000 | Freq: Once | Status: AC
  Administered 2014-06-02: 1
  Filled 2014-06-02: qty 1

## 2014-06-02 NOTE — Progress Notes (Signed)
Patient arrived to the unit at 1300. Alert and orientedx4. Ambulates in the room. Denies pain.Roanna Epley RN

## 2014-06-02 NOTE — Progress Notes (Signed)
Inpatient Diabetes Program Recommendations  AACE/ADA: New Consensus Statement on Inpatient Glycemic Control (2013)  Target Ranges:  Prepandial:   less than 140 mg/dL      Peak postprandial:   less than 180 mg/dL (1-2 hours)      Critically ill patients:  140 - 180 mg/dL   Reason for Visit: Diabetes Consult  Diabetes history: DM2 and GDM Outpatient Diabetes medications: metformin 1000 mg bid Current orders for Inpatient glycemic control: 70/30 20 units bid  38 year old female with PMH DM2 and HTN, presents with diarrhea and weakness. States she checks blood sugars at home approx 2x/week. Goes to [facility] for PCP. States she has no insurance and will need affordable insulin. Has recently ordered [employer] Diabetes food and zumba videos. Daughter has also recently been diagnosed with DM2. Discussed HgbA1C of 10.3% and how to reduce to 7.0%. Discussed diet, exercise, and stress and how each of these affect blood sugar. Discussed insulin resistance and how to help her body better use her own insulin. Discussed importance of glucose control to prevent long-term complications of DM. Interested in OP Diabetes education. Discussed [place] option for PCP. Will order insulin starter kit and RN to begin teaching insulin administration. Pt to view diabetes videos on pt ed channel and review Living Well With Diabetes book. Encouraged pt to write down any questions and writer will f/u in am. Seems motivated to make lifestyle changes. Stressed importance of checking blood sugars and taking logbook to MD appts in order to fine-tune her insulin regimen.  Blood sugars continue in 200s. May benefit from increase in 70/30 insulin.  Inpatient Diabetes Program Recommendations Insulin - Basal: Increase 70/30 to 30 units bid Correction (SSI): Add Novolog moderate tidwc and hs HgbA1C: 10.3% - uncontrolled Outpatient Referral: OP Diabetes Education consult  Note: Has meter and supplies at home. Will need  ReliOn 70/30 ($24.88) prescription Possible prescription for Regular insulin for sliding scale.  F/u in am. Thank you. Lorenda Peck, RD, LDN, CDE Inpatient Diabetes Coordinator [phone]

## 2014-06-02 NOTE — Progress Notes (Signed)
PROGRESS NOTE    Brittany Lopez HKV:[id number] DOB: 1976/04/22 DOA: 05/30/2014 PCP: No PCP Per Patient at [facility].  HPI/Brief narrative 38 year old female patient with history of DM 2, HTN, presented to the ER with complaints of diarrhea and weakness. She states that her symptoms started a couple of hours after eating at a [employer] on 05/27/14. She denied nausea, vomiting, fever, chills or abdominal pain. In the ED, patient started having dyspnea. D-dimer was positive. CTA chest was negative for PE but suggested findings of pulmonary edema versus ARDS. Diarrhea resolved. Treated for decompensated CHF and acute respiratory failure which have improved. She will be transferred to telemetry and possible discharge in next 24 hours.    Assessment/Plan:  1. Diarrhea: Likely acute viral GE versus food poisoning. Resolved after supportive treatment. Tolerating diet. C. difficile PCR negative. Followup GI pathogen panel PCR and stool culture results. 2. Acute diastolic CHF: Precipitated by IVF and uncontrolled hypertension. Treated with IV Lasix and improving. 2-D echo shows LVEF 50-55%, cannot exclude inferior hypokinesis, grade 1 diastolic dysfunction and cannot exclude vegetations/mass or noncoronary or left coronary cusp. -3.8 L since admission. Improving. Cardiology input appreciated. Creatinine increased 1.07 > 1.28 > 1.44. Hold lisinopril for today. DC IV Lasix and start oral Lasix tonight. Follow BMP closely. 3. Acute respiratory failure with hypoxia: Likely precipitated by problem #2. CTA chest: No large PE. Resolved. No clinical pneumonia. DC levofloxacin. Chest x-ray improved. 4. Accelerated hypertension: Continue home lisinopril 20 mg daily-held 9/23. Added carvedilol 12.5 mg twice a day. When necessary IV labetalol. Will need tighter control of BP on the long run. Added amlodipine 5 mg daily. 5. Uncontrolled DM 2: Metformin held. Started low-dose Lantus and NovoLog SSI.  A1c: 10.3. May likely need insulins at discharge at least on the short term. Switched to 70/30 insulin 20 units 2 times daily-patient has no insurance. 6. Anemia: Possibly chronic. Stable 7. Mild leukocytosis: Likely stress response. Stable 8. ? AV calcification: As per cardiology input, echo images were reviewed and do not believe that she needs TEE at this time. Low index of suspicion for endocarditis. Calcified lesion makes endocarditis unlikely too. 9. Morbid obesity 10. Mild acute kidney injury: Secondary to diuresis-patient also on lisinopril and received contrast. Management as above. Follow BMP in a.m.  Code Status: Full Family Communication: None at bedside Disposition Plan: Transfer to medical bed. Possible DC the next 24 hours.   Consultants:  None  Procedures:  Foley-DC'd 9/21  Antibiotics:  IV levofloxacin 9/21 > 9/22   Subjective: Denies complaints. Denies dyspnea.  Objective: Filed Vitals:   06/02/14 0000 06/02/14 0400 06/02/14 0700 06/02/14 0830  BP:  135/57 157/76 172/84  Pulse:    81  Temp: 97.7 F (36.5 C) 97.9 F (36.6 C)    TempSrc: Oral Oral    Resp:    19  Height:      Weight:  138.8 kg (306 lb)    SpO2:    93%    Intake/Output Summary (Last 24 hours) at 06/02/14 1224 Last data filed at 06/02/14 0844  Gross per 24 hour  Intake    120 ml  Output    750 ml  Net   -630 ml   Filed Weights   05/31/14 0234 06/02/14 0400  Weight: 136.079 kg (300 lb) 138.8 kg (306 lb)     Exam:  General exam: Moderately built and morbidly obese female sitting up comfortably in bed. Respiratory system: Occasional basal crackles but otherwise  clear to auscultation. No wheezing or rhonchi. No increased work of breathing. Cardiovascular system: S1 & S2 heard, RRR. No JVD, murmurs, gallops, clicks. Trace bilateral leg edema. Telemetry: Sinus rhythm. Gastrointestinal system: Abdomen is nondistended, soft and nontender. Normal bowel sounds heard. Central nervous  system: Alert and oriented. No focal neurological deficits. Extremities: Symmetric 5 x 5 power.   Data Reviewed: Basic Metabolic Panel:  Recent Labs Lab 05/30/14 2002 05/31/14 0334 06/01/14 1001 06/02/14 0307  NA 140 138 135* 138  K 4.1 3.9 3.9 3.5*  CL 103 103 99 100  CO2 '24 24 24 27  ' GLUCOSE 261* 288* 300* 248*  BUN '12 11 16 21  ' CREATININE 1.13* 1.07 1.28* 1.44*  CALCIUM 9.3 8.4 8.7 8.5   Liver Function Tests:  Recent Labs Lab 05/31/14 0334  AST 10  9  ALT 10  10  ALKPHOS 72  69  BILITOT <0.2*  <0.2*  PROT 6.4  6.3  ALBUMIN 2.4*  2.4*    Recent Labs Lab 05/31/14 0334  LIPASE 39   No results found for this basename: AMMONIA,  in the last 168 hours CBC:  Recent Labs Lab 05/30/14 2002 05/31/14 0334 06/01/14 1001  WBC 13.2* 13.8* 13.3*  NEUTROABS  --  9.1*  --   HGB 10.8* 9.7* 9.8*  HCT 33.7* 31.4* 31.2*  MCV 69.1* 71.0* 70.9*  PLT 239 245 237   Cardiac Enzymes:  Recent Labs Lab 05/31/14 0334  TROPONINI <0.30   BNP (last 3 results)  Recent Labs  05/31/14 0334 06/01/14 1001  PROBNP 1697.0* 753.9*   CBG:  Recent Labs Lab 06/01/14 0814 06/01/14 1143 06/01/14 1638 06/01/14 2148 06/02/14 0808  GLUCAP 172* 291* 172* 151* 199*    Recent Results (from the past 240 hour(s))  MRSA PCR SCREENING     Status: Abnormal   Collection Time    05/31/14  3:34 AM      Result Value Ref Range Status   MRSA by PCR INVALID RESULTS, SPECIMEN SENT FOR CULTURE (*) NEGATIVE Final   Comment: NOTIFIED M. REEVES RN AT [facility] ON 09.21.15 BY SHUEA                The GeneXpert MRSA Assay (FDA     approved for NASAL specimens     only), is one component of a     comprehensive MRSA colonization     surveillance program. It is not     intended to diagnose MRSA     infection nor to guide or     monitor treatment for     MRSA infections.  MRSA CULTURE     Status: None   Collection Time    05/31/14  3:34 AM      Result Value Ref Range Status   Specimen  Description NOSE   Final   Special Requests NONE   Final   Culture     Final   Value: FEW STAPHYLOCOCCUS AUREUS     Note: NOMRSA     Performed at [facility]   Report Status 06/01/2014 FINAL   Final  RESPIRATORY VIRUS PANEL     Status: None   Collection Time    05/31/14  4:39 AM      Result Value Ref Range Status   Source - RVPAN NASAL SWAB   Corrected   Comment: CORRECTED ON 09/22 AT 2022: PREVIOUSLY REPORTED AS NASAL SWAB   Respiratory Syncytial Virus A NOT DETECTED   Final   Respiratory Syncytial  Virus B NOT DETECTED   Final   Influenza A NOT DETECTED   Final   Influenza B NOT DETECTED   Final   Parainfluenza 1 NOT DETECTED   Final   Parainfluenza 2 NOT DETECTED   Final   Parainfluenza 3 NOT DETECTED   Final   Metapneumovirus NOT DETECTED   Final   Rhinovirus NOT DETECTED   Final   Adenovirus NOT DETECTED   Final   Influenza A H1 NOT DETECTED   Final   Influenza A H3 NOT DETECTED   Final   Comment: (NOTE)           Normal Reference Range for each Analyte: NOT DETECTED     Testing performed using the Luminex xTAG Respiratory Viral Panel test     kit.     The analytical performance characteristics of this assay have been     determined by [facility].  The modifications have not been     cleared or approved by the FDA. This assay has been validated pursuant     to the CLIA regulations and is used for clinical purposes.     Performed at [facility] DIFFICILE BY PCR     Status: None   Collection Time    05/31/14  8:07 PM      Result Value Ref Range Status   C difficile by pcr NEGATIVE  NEGATIVE Final   Comment: Performed at [facility]       Additional labs: 1. 2 D Echo: Impressions:  - Limited images overall. Mild LVH with LVEF approximately 50-55%, cannot exclude inferior hypokinesis, grade 1 diastolic dysfunction. Appears to be an area of moderate calcification involving the noncoronary or left coronary cusp and  adjacent annulus. Not well seen, cannot completely exclude a calcified vegetation or mass. This could be further visualized by TEE, if felt to be clinically appropriate. Trivial tricuspid regurgitation, unable to assess PASP.      Studies: Dg Chest 2 View  06/01/2014   CLINICAL DATA:  CHF, followup, history hypertension, diabetes  EXAM: CHEST  2 VIEW  COMPARISON:  05/30/2014  FINDINGS: Enlargement of cardiac silhouette with pulmonary vascular congestion.  Improved interstitial edema since previous exam.  No segmental consolidation, pleural effusion or pneumothorax.  Bones unremarkable.  IMPRESSION: Improved pulmonary edema.   Electronically Signed   By: Lavonia Dana M.D.   On: 06/01/2014 08:37        Scheduled Meds: . amLODipine  5 mg Oral Daily  . antiseptic oral rinse  7 mL Mouth Rinse q12n4p  . carvedilol  12.5 mg Oral BID WC  . chlorhexidine  15 mL Mouth Rinse BID  . enoxaparin (LOVENOX) injection  40 mg Subcutaneous Q24H  . furosemide  40 mg Oral BID  . insulin aspart protamine- aspart  20 Units Subcutaneous BID WC  . [START ON 06/03/2014] lisinopril  20 mg Oral Daily  . sodium chloride  3 mL Intravenous Q12H   Continuous Infusions:    Principal Problem:   Acute respiratory failure with hypoxia Active Problems:   Type 2 diabetes mellitus with hyperglycemia   Uncontrolled hypertension   Acute diastolic CHF (congestive heart failure), NYHA class 3    Time spent: 25 minutes    Estrella Alcaraz, MD, FACP, FHM. [facility] Pager [phone]  If 7PM-7AM, please contact night-coverage [email or web address] Password [facility] 06/02/2014, 12:24 PM    LOS: 3 days

## 2014-06-02 NOTE — Progress Notes (Signed)
Patient Name: Brittany Lopez Date of Encounter: 06/02/2014  Principal Problem:   Acute respiratory failure with hypoxia Active Problems:   Type 2 diabetes mellitus with hyperglycemia   Uncontrolled hypertension   Acute diastolic CHF (congestive heart failure), NYHA class 3   Length of Stay: 3  SUBJECTIVE  The patient states that her breathing has improved, and overall she feels significantly better. Diarrhea has resolved.  CURRENT MEDS . antiseptic oral rinse  7 mL Mouth Rinse q12n4p  . carvedilol  12.5 mg Oral BID WC  . chlorhexidine  15 mL Mouth Rinse BID  . enoxaparin (LOVENOX) injection  40 mg Subcutaneous Q24H  . insulin aspart  0-15 Units Subcutaneous TID WC  . insulin aspart  0-5 Units Subcutaneous QHS  . insulin aspart  5 Units Subcutaneous TID WC  . insulin glargine  15 Units Subcutaneous Daily  . lisinopril  20 mg Oral Daily  . sodium chloride  3 mL Intravenous Q12H    OBJECTIVE  Filed Vitals:   06/01/14 1900 06/02/14 0000 06/02/14 0400 06/02/14 0700  BP: 142/70  135/57 157/76  Pulse: 78     Temp: 98.6 F (37 C) 97.7 F (36.5 C) 97.9 F (36.6 C)   TempSrc: Oral Oral Oral   Resp:      Height:      Weight:   306 lb (138.8 kg)   SpO2: 98%       Intake/Output Summary (Last 24 hours) at 06/02/14 0735 Last data filed at 06/01/14 2000  Gross per 24 hour  Intake      0 ml  Output   1100 ml  Net  -1100 ml   Filed Weights   05/31/14 0234 06/02/14 0400  Weight: 300 lb (136.079 kg) 306 lb (138.8 kg)    PHYSICAL EXAM  General: Pleasant, NAD. Neuro: Alert and oriented X 3. Moves all extremities spontaneously. Psych: Normal affect. HEENT:  Normal  Neck: Supple without bruits or JVD. Lungs:  Resp regular and unlabored, bilateral crackles up to mid lungs. Heart: RRR no s3, s4, or murmurs. Abdomen: Soft, non-tender, non-distended, BS + x 4.  Extremities: No clubbing, cyanosis or edema. DP/PT/Radials 2+ and equal bilaterally.  Accessory Clinical  Findings  CBC  Recent Labs  05/31/14 0334 06/01/14 1001  WBC 13.8* 13.3*  NEUTROABS 9.1*  --   HGB 9.7* 9.8*  HCT 31.4* 31.2*  MCV 71.0* 70.9*  PLT 245 237   Basic Metabolic Panel  Recent Labs  06/01/14 1001 06/02/14 0307  NA 135* 138  K 3.9 3.5*  CL 99 100  CO2 24 27  GLUCOSE 300* 248*  BUN 16 21  CREATININE 1.28* 1.44*  CALCIUM 8.7 8.5   Liver Function Tests  Recent Labs  05/31/14 0334  AST 10  9  ALT 10  10  ALKPHOS 72  69  BILITOT <0.2*  <0.2*  PROT 6.4  6.3  ALBUMIN 2.4*  2.4*    Recent Labs  05/31/14 0334  LIPASE 39   Cardiac Enzymes  Recent Labs  05/31/14 0334  TROPONINI <0.30   BNP No components found with this basename: POCBNP,  D-Dimer  Recent Labs  05/30/14 2101  DDIMER 1.27*   Hemoglobin A1C  Recent Labs  05/31/14 0334  HGBA1C 10.3*   Thyroid Function Tests  Recent Labs  05/31/14 0334  TSH 4.450  T3FREE 3.0    Radiology/Studies  Dg Chest 2 View  06/01/2014   CLINICAL DATA:  CHF, followup, history hypertension, diabetes  EXAM: CHEST  2 VIEW  COMPARISON:  05/30/2014  FINDINGS: Enlargement of cardiac silhouette with pulmonary vascular congestion.  Improved interstitial edema since previous exam.  No segmental consolidation, pleural effusion or pneumothorax.  Bones unremarkable.  IMPRESSION: Improved pulmonary edema.   Electronically Signed   By: Ulyses Southward M.D.   On: 06/01/2014 08:37   Ct Angio Chest Pe W/cm &/or Wo Cm  05/31/2014   CLINICAL DATA:  Chest pain, hypoxia for 3 days with elevated D-dimer and elevated white blood count   IMPRESSION: Very limited evaluation of the pulmonary arterial system with no evidence of large central emboli.  Severe bilateral alveolar opacification. Distribution suggests the possibility acute severe pulmonary edema. Pulmonary hemorrhage and ARDS are also considerations.     TELE: SR  ECG:   ECHO 05/31/2014 Left ventricle: The cavity size was normal. Wall thickness  was increased in a pattern of mild LVH. Systolic function was normal. The estimated ejection fraction was in the range of 50% to 55%. Cannot exclude hypokinesis of the basalinferior myocardium. Doppler parameters are consistent with abnormal left ventricular relaxation (grade 1 diastolic dysfunction). - Aortic valve: Probably trileaflet. Appears to be an area of moderate calcification involving the noncoronary or left coronary cusp and adjacent annulus. Not well seen, cannot completely exclude a calcified vegetation or mass. This could be further visualized by TEE, if felt to be clinically appropriate. Cusp separation was normal. There was no significant regurgitation. - Left atrium: The atrium was at the upper limits of normal in size. - Right atrium: Central venous pressure (est): 3 mm Hg. - Tricuspid valve: There was trivial regurgitation. - Pulmonary arteries: Systolic pressure could not be accurately estimated. - Pericardium, extracardiac: There was no pericardial effusion.  Impressions:  - Limited images overall. Mild LVH with LVEF approximately 50-55%, cannot exclude inferior hypokinesis, grade 1 diastolic dysfunction. Appears to be an area of moderate calcification involving the noncoronary or left coronary cusp and adjacent annulus. Not well seen, cannot completely exclude a calcified vegetation or mass. This could be further visualized by TEE, if felt to be clinically appropriate. Trivial tricuspid regurgitation, unable to assess PASP.     ASSESSMENT AND PLAN  38 year old [place] American female with past medical history significant for hypertension, diabetes and morbid obesity presented with diarrhea x 3 days and malignant hypertension. While in the ED, she began to have dyspnea and required nonrebreather.   1. Acute on chronic diastolic HF  - in the setting of uncontrolled HTN and 2 L IV fluid resuscitation with diarrhea - negative 1.1 liter since yesterday with  clinical improvement - Crea worsening 1 -->1.2--> 1.4 - hold Lasix this am, restart tonight with PO 40 mg BID - need aggressive BP control  2. ?AV calcification  - Dr. [place] reviewed the echo image, does not believe she need TEE at this time. A-febrile, low suspicion for endocarditis. Calcified lesion make endocarditis unlikely too   3. Uncontrolled HTN: as above, better HR control today with coreg, hold lisinopril today, add amlodipine 5 mg po daily  4. Diabetes - poorly controlled, HbA1c 10.3%, on insulin now, possibly will require for home  5. morbid obesity  Signed, Lars Masson MD, [facility] 06/02/2014

## 2014-06-02 NOTE — Progress Notes (Signed)
Transferred pt to 3W. Report given to Lupita Leash, RN.   VS upon arrival    .BP 154/85  Pulse 98  Temp(Src) 97.8 F (36.6 C) (Oral)  Resp 18  Ht  (1.575 m)  Wt 138.8 kg (306 lb)  BMI 55.95 kg/m2  SpO2 95%  LMP 05/26/2014

## 2014-06-03 LAB — BASIC METABOLIC PANEL
ANION GAP: 11 (ref 5–15)
BUN: 19 mg/dL (ref 6–23)
CHLORIDE: 106 meq/L (ref 96–112)
CO2: 26 meq/L (ref 19–32)
Calcium: 8.6 mg/dL (ref 8.4–10.5)
Creatinine, Ser: 1.32 mg/dL — ABNORMAL HIGH (ref 0.50–1.10)
GFR calc Af Amer: 58 mL/min — ABNORMAL LOW (ref >90)
GFR calc non Af Amer: 50 mL/min — ABNORMAL LOW (ref >90)
Glucose, Bld: 128 mg/dL — ABNORMAL HIGH (ref 70–99)
Potassium: 3.9 mEq/L (ref 3.7–5.3)
Sodium: 143 mEq/L (ref 137–147)

## 2014-06-03 LAB — GLUCOSE, CAPILLARY
Glucose-Capillary: 153 mg/dL — ABNORMAL HIGH (ref 70–99)
Glucose-Capillary: 144 mg/dL — ABNORMAL HIGH (ref 70–99)
Glucose-Capillary: 150 mg/dL — ABNORMAL HIGH (ref 70–99)
Glucose-Capillary: 143 mg/dL — ABNORMAL HIGH (ref 70–99)

## 2014-06-03 MED ORDER — AMLODIPINE BESYLATE 10 MG PO TABS
10.0000 mg | ORAL_TABLET | Freq: Every day | ORAL | Status: DC
  Administered 2014-06-03 – 2014-06-04 (×2): 10 mg via ORAL
  Filled 2014-06-03 (×2): qty 1

## 2014-06-03 NOTE — Progress Notes (Signed)
Patient Name: Brittany Lopez Date of Encounter: 06/03/2014  Principal Problem:   Acute respiratory failure with hypoxia Active Problems:   Type 2 diabetes mellitus with hyperglycemia   Uncontrolled hypertension   Acute diastolic CHF (congestive heart failure), NYHA class 3   Length of Stay: 4  SUBJECTIVE  The patient feels significantly better, no SOB.   CURRENT MEDS . amLODipine  5 mg Oral Daily  . antiseptic oral rinse  7 mL Mouth Rinse q12n4p  . carvedilol  12.5 mg Oral BID WC  . chlorhexidine  15 mL Mouth Rinse BID  . enoxaparin (LOVENOX) injection  40 mg Subcutaneous Q24H  . furosemide  40 mg Oral BID  . insulin aspart  0-9 Units Subcutaneous TID WC  . insulin aspart protamine- aspart  25 Units Subcutaneous BID WC  . lisinopril  20 mg Oral Daily  . sodium chloride  3 mL Intravenous Q12H    OBJECTIVE  Filed Vitals:   06/02/14 1738 06/02/14 2158 06/03/14 0529 06/03/14 0531  BP: 160/102 177/99 161/97 160/93  Pulse: 85 88 80   Temp:  98.4 F (36.9 C) 98.4 F (36.9 C)   TempSrc:  Oral Oral   Resp:  22 18   Height:      Weight:   305 lb 14.4 oz (138.755 kg)   SpO2:  96% 99%     Intake/Output Summary (Last 24 hours) at 06/03/14 0747 Last data filed at 06/03/14 0529  Gross per 24 hour  Intake    750 ml  Output      0 ml  Net    750 ml   Filed Weights   05/31/14 0234 06/02/14 0400 06/03/14 0529  Weight: 300 lb (136.079 kg) 306 lb (138.8 kg) 305 lb 14.4 oz (138.755 kg)    PHYSICAL EXAM  General: Pleasant, NAD. Neuro: Alert and oriented X 3. Moves all extremities spontaneously. Psych: Normal affect. HEENT:  Normal  Neck: Supple without bruits or JVD. Lungs:  Resp regular and unlabored, bilateral crackles up to mid lungs. Heart: RRR no s3, s4, or murmurs. Abdomen: Soft, non-tender, non-distended, BS + x 4.  Extremities: No clubbing, cyanosis or edema. DP/PT/Radials 2+ and equal bilaterally.  Accessory Clinical Findings  CBC  Recent Labs  06/01/14 1001  WBC 13.3*  HGB 9.8*  HCT 31.2*  MCV 70.9*  PLT 237   Basic Metabolic Panel  Recent Labs  06/02/14 0307 06/03/14 0433  NA 138 143  K 3.5* 3.9  CL 100 106  CO2 27 26  GLUCOSE 248* 128*  BUN 21 19  CREATININE 1.44* 1.32*  CALCIUM 8.5 8.6   Radiology/Studies  Dg Chest 2 View  06/01/2014   CLINICAL DATA:  CHF, followup, history hypertension, diabetes  EXAM: CHEST  2 VIEW  COMPARISON:  05/30/2014  FINDINGS: Enlargement of cardiac silhouette with pulmonary vascular congestion.  Improved interstitial edema since previous exam.  No segmental consolidation, pleural effusion or pneumothorax.  Bones unremarkable.  IMPRESSION: Improved pulmonary edema.   Electronically Signed   By: Ulyses Southward M.D.   On: 06/01/2014 08:37   Ct Angio Chest Pe W/cm &/or Wo Cm  05/31/2014   CLINICAL DATA:  Chest pain, hypoxia for 3 days with elevated D-dimer and elevated white blood count   IMPRESSION: Very limited evaluation of the pulmonary arterial system with no evidence of large central emboli.  Severe bilateral alveolar opacification. Distribution suggests the possibility acute severe pulmonary edema. Pulmonary hemorrhage and ARDS are also considerations.  TELE: SR  ECG:   ECHO 05/31/2014 Left ventricle: The cavity size was normal. Wall thickness was increased in a pattern of mild LVH. Systolic function was normal. The estimated ejection fraction was in the range of 50% to 55%. Cannot exclude hypokinesis of the basalinferior myocardium. Doppler parameters are consistent with abnormal left ventricular relaxation (grade 1 diastolic dysfunction). - Aortic valve: Probably trileaflet. Appears to be an area of moderate calcification involving the noncoronary or left coronary cusp and adjacent annulus. Not well seen, cannot completely exclude a calcified vegetation or mass. This could be further visualized by TEE, if felt to be clinically appropriate. Cusp separation was normal. There  was no significant regurgitation. - Left atrium: The atrium was at the upper limits of normal in size. - Right atrium: Central venous pressure (est): 3 mm Hg. - Tricuspid valve: There was trivial regurgitation. - Pulmonary arteries: Systolic pressure could not be accurately estimated. - Pericardium, extracardiac: There was no pericardial effusion.  Impressions:  - Limited images overall. Mild LVH with LVEF approximately 50-55%, cannot exclude inferior hypokinesis, grade 1 diastolic dysfunction. Appears to be an area of moderate calcification involving the noncoronary or left coronary cusp and adjacent annulus. Not well seen, cannot completely exclude a calcified vegetation or mass. This could be further visualized by TEE, if felt to be clinically appropriate. Trivial tricuspid regurgitation, unable to assess PASP.     ASSESSMENT AND PLAN  38 year old [place] American female with past medical history significant for hypertension, diabetes and morbid obesity presented with diarrhea x 3 days and malignant hypertension. While in the ED, she began to have dyspnea and required nonrebreather.   1. Acute on chronic diastolic HF  - in the setting of uncontrolled HTN and 2 L IV fluid resuscitation with diarrhea - Lasix held yesterday for ARF, popsitive fluid balance in the last 24 hours, I agree with restarting Lasix 40 mg po daily.  - Crea improving 1 -->1.2--> 1.4 --> 1.3 - need aggressive BP control - start physical therapy today  2. Uncontrolled HTN: increase amlodipine to 10 mg po daily  3. ?AV calcification  - Dr. [place] reviewed the echo image, does not believe she need TEE at this time. A-febrile, low suspicion for endocarditis. Calcified lesion make endocarditis unlikely too   4. Diabetes - poorly controlled, HbA1c 10.3%, on insulin now, possibly will require for home  5. Morbid obesity  Plan: I would plan on discharging her tomorrow to see diuresis on current dose of  diuretics and BP control (needs to be < 140 mmHg before discharge).  Signed, Lars Masson MD, [facility] 06/03/2014

## 2014-06-03 NOTE — Progress Notes (Signed)
Nutrition Note  RD attempted DM2 diet education; however pt was resting and requested RD perform education at a later time. RD will re-attempt on 9/25 prior to discharge  Lloyd Huger MS RD LDN Clinical Dietitian Pager:[phone]

## 2014-06-03 NOTE — Progress Notes (Signed)
PT Cancellation/Screen Note  Patient Details Name: Brittany Lopez MRN: [id number] DOB: 30-Jun-1976   Cancelled Treatment:    Reason Eval/Treat Not Completed: PT screened, no needs identified, will sign off--spoke with pt who reports no PT needs. Pt states "I have been all over this hospital." Will sign off. Thanks.    Rebeca Alert, MPT Pager: [phone]

## 2014-06-03 NOTE — Progress Notes (Signed)
PROGRESS NOTE    Brittany Lopez TMH:[id number] DOB: Mar 06, 1976 DOA: 05/30/2014 PCP: No PCP Per Patient at [facility].  HPI/Brief narrative 38 year old female patient with history of DM 2, HTN, presented to the ER with complaints of diarrhea and weakness. She states that her symptoms started a couple of hours after eating at a [employer] on 05/27/14. She denied nausea, vomiting, fever, chills or abdominal pain. In the ED, patient started having dyspnea. D-dimer was positive. CTA chest was negative for PE but suggested findings of pulmonary edema versus ARDS. Diarrhea resolved. Treated for decompensated CHF and acute respiratory failure which have improved. She will be transferred to telemetry and possible discharge in next 24 hours.    Assessment/Plan:  1. Diarrhea: Likely acute viral GE versus food poisoning. Resolved after supportive treatment. Tolerating diet. C. difficile PCR negative. Followup GI pathogen panel PCR and stool culture results. 2. Acute diastolic CHF: Precipitated by IVF and uncontrolled hypertension. Treated initially with IV Lasix. 2-D echo shows LVEF 50-55%, cannot exclude inferior hypokinesis, grade 1 diastolic dysfunction and cannot exclude vegetations/mass or noncoronary or left coronary cusp.Cardiology followup appreciated. Creatinine increased 1.07 > 1.28 > 1.44 down to 1.3. Changed Lasix to by mouth 9/23 PM. Continue to hold lisinopril until creatinine stabilizes-may consider restarting OP. 3. Acute respiratory failure with hypoxia: Likely precipitated by problem #2. CTA chest: No large PE. Resolved. No clinical pneumonia. DC levofloxacin. Chest x-ray improved. 4. Accelerated hypertension: Added carvedilol 12.5 mg twice a day. When necessary IV labetalol. Will need tighter control of BP on the long run. Added amlodipine 5 mg daily and increase to 10 mg daily. Hold lisinopril until creatinine stabilizes. 5. Uncontrolled DM 2: Metformin held and we'll  not resume until creatinine improves/nebulizers. Started low-dose Lantus and NovoLog SSI. A1c: 10.3. May likely need insulins at discharge at least on the short term. Switched to 70/30 insulin 25 units 2 times daily-patient has no insurance. Better control. 6. Anemia: Possibly chronic. Stable 7. Mild leukocytosis: Likely stress response. Stable 8. ? AV calcification: As per cardiology input, echo images were reviewed and do not believe that she needs TEE at this time. Low index of suspicion for endocarditis. Calcified lesion makes endocarditis unlikely too. 9. Morbid obesity 10. Mild acute kidney injury: Secondary to diuresis-patient also on lisinopril and received contrast. Management as above. Creatinine improving. Follow in a.m.  Code Status: Full Family Communication: None at bedside Disposition Plan: Possible DC the next 24 hours.   Consultants:  None  Procedures:  Foley-DC'd 9/21  Antibiotics:  IV levofloxacin 9/21 > 9/22   Subjective: Denies complaints. Denies dyspnea.  Objective: Filed Vitals:   06/02/14 1738 06/02/14 2158 06/03/14 0529 06/03/14 0531  BP: 160/102 177/99 161/97 160/93  Pulse: 85 88 80   Temp:  98.4 F (36.9 C) 98.4 F (36.9 C)   TempSrc:  Oral Oral   Resp:  22 18   Height:      Weight:   138.755 kg (305 lb 14.4 oz)   SpO2:  96% 99%     Intake/Output Summary (Last 24 hours) at 06/03/14 1059 Last data filed at 06/03/14 0816  Gross per 24 hour  Intake    870 ml  Output      0 ml  Net    870 ml   Filed Weights   05/31/14 0234 06/02/14 0400 06/03/14 0529  Weight: 136.079 kg (300 lb) 138.8 kg (306 lb) 138.755 kg (305 lb 14.4 oz)     Exam:  General exam: Moderately built and morbidly obese female sitting up comfortably in chair. Respiratory system: Occasional basal crackles but otherwise clear to auscultation. No wheezing or rhonchi. No increased work of breathing. Cardiovascular system: S1 & S2 heard, RRR. No JVD, murmurs, gallops, clicks.  Trace bilateral leg edema.  Gastrointestinal system: Abdomen is nondistended, soft and nontender. Normal bowel sounds heard. Central nervous system: Alert and oriented. No focal neurological deficits. Extremities: Symmetric 5 x 5 power.   Data Reviewed: Basic Metabolic Panel:  Recent Labs Lab 05/30/14 2002 05/31/14 0334 06/01/14 1001 06/02/14 0307 06/03/14 0433  NA 140 138 135* 138 143  K 4.1 3.9 3.9 3.5* 3.9  CL 103 103 99 100 106  CO2 _0 GLUCOSE 261* 288* 300* 248* 128*  BUN _1 CREATININE 1.13* 1.07 1.28* 1.44* 1.32*  CALCIUM 9.3 8.4 8.7 8.5 8.6   Liver Function Tests:  Recent Labs Lab 05/31/14 0334  AST 10  9  ALT 10  10  ALKPHOS 72  69  BILITOT <0.2*  <0.2*  PROT 6.4  6.3  ALBUMIN 2.4*  2.4*    Recent Labs Lab 05/31/14 0334  LIPASE 39   No results found for this basename: AMMONIA,  in the last 168 hours CBC:  Recent Labs Lab 05/30/14 2002 05/31/14 0334 06/01/14 1001  WBC 13.2* 13.8* 13.3*  NEUTROABS  --  9.1*  --   HGB 10.8* 9.7* 9.8*  HCT 33.7* 31.4* 31.2*  MCV 69.1* 71.0* 70.9*  PLT 239 245 237   Cardiac Enzymes:  Recent Labs Lab 05/31/14 0334  TROPONINI <0.30   BNP (last 3 results)  Recent Labs  05/31/14 0334 06/01/14 1001  PROBNP 1697.0* 753.9*   CBG:  Recent Labs Lab 06/02/14 0808 06/02/14 1151 06/02/14 1739 06/02/14 2150 06/03/14 0730  GLUCAP 199* 275* 269* 148* 153*    Recent Results (from the past 240 hour(s))  OVA AND PARASITE EXAMINATION     Status: None   Collection Time    05/30/14  7:59 PM      Result Value Ref Range Status   Specimen Description STOOL   Final   Special Requests NONE   Final   Ova and parasites     Final   Value: NO OVA OR PARASITES SEEN     Performed at [facility]   Report Status 06/02/2014 FINAL   Final  MRSA PCR SCREENING     Status: Abnormal   Collection Time    05/31/14  3:34 AM      Result Value Ref Range Status   MRSA by PCR INVALID  RESULTS, SPECIMEN SENT FOR CULTURE (*) NEGATIVE Final   Comment: NOTIFIED M. REEVES RN AT [facility] ON 09.21.15 BY SHUEA                The GeneXpert MRSA Assay (FDA     approved for NASAL specimens     only), is one component of a     comprehensive MRSA colonization     surveillance program. It is not     intended to diagnose MRSA     infection nor to guide or     monitor treatment for     MRSA infections.  MRSA CULTURE     Status: None   Collection Time    05/31/14  3:34 AM      Result Value Ref Range Status   Specimen Description NOSE   Final   Special  Requests NONE   Final   Culture     Final   Value: FEW STAPHYLOCOCCUS AUREUS     Note: NOMRSA     Performed at [facility]   Report Status 06/01/2014 FINAL   Final  RESPIRATORY VIRUS PANEL     Status: None   Collection Time    05/31/14  4:39 AM      Result Value Ref Range Status   Source - RVPAN NASAL SWAB   Corrected   Comment: CORRECTED ON 09/22 AT 2022: PREVIOUSLY REPORTED AS NASAL SWAB   Respiratory Syncytial Virus A NOT DETECTED   Final   Respiratory Syncytial Virus B NOT DETECTED   Final   Influenza A NOT DETECTED   Final   Influenza B NOT DETECTED   Final   Parainfluenza 1 NOT DETECTED   Final   Parainfluenza 2 NOT DETECTED   Final   Parainfluenza 3 NOT DETECTED   Final   Metapneumovirus NOT DETECTED   Final   Rhinovirus NOT DETECTED   Final   Adenovirus NOT DETECTED   Final   Influenza A H1 NOT DETECTED   Final   Influenza A H3 NOT DETECTED   Final   Comment: (NOTE)           Normal Reference Range for each Analyte: NOT DETECTED     Testing performed using the Luminex xTAG Respiratory Viral Panel test     kit.     The analytical performance characteristics of this assay have been     determined by [facility].  The modifications have not been     cleared or approved by the FDA. This assay has been validated pursuant     to the CLIA regulations and is used for clinical purposes.     Performed at  [facility] DIFFICILE BY PCR     Status: None   Collection Time    05/31/14  8:07 PM      Result Value Ref Range Status   C difficile by pcr NEGATIVE  NEGATIVE Final   Comment: Performed at [facility]       Additional labs: 1. 2 D Echo: Impressions:  - Limited images overall. Mild LVH with LVEF approximately 50-55%, cannot exclude inferior hypokinesis, grade 1 diastolic dysfunction. Appears to be an area of moderate calcification involving the noncoronary or left coronary cusp and adjacent annulus. Not well seen, cannot completely exclude a calcified vegetation or mass. This could be further visualized by TEE, if felt to be clinically appropriate. Trivial tricuspid regurgitation, unable to assess PASP.      Studies: No results found.      Scheduled Meds: . amLODipine  10 mg Oral Daily  . antiseptic oral rinse  7 mL Mouth Rinse q12n4p  . carvedilol  12.5 mg Oral BID WC  . chlorhexidine  15 mL Mouth Rinse BID  . enoxaparin (LOVENOX) injection  40 mg Subcutaneous Q24H  . furosemide  40 mg Oral BID  . insulin aspart  0-9 Units Subcutaneous TID WC  . insulin aspart protamine- aspart  25 Units Subcutaneous BID WC  . sodium chloride  3 mL Intravenous Q12H   Continuous Infusions:    Principal Problem:   Acute respiratory failure with hypoxia Active Problems:   Type 2 diabetes mellitus with hyperglycemia   Uncontrolled hypertension   Acute diastolic CHF (congestive heart failure), NYHA class 3    Time spent: 25 minutes  Vernell Leep, MD, FACP, FHM. [facility] Pager [phone]  If 7PM-7AM, please contact night-coverage [email or web address] Password [email or web address] 06/03/2014, 10:59 AM    LOS: 4 days

## 2014-06-04 LAB — GLUCOSE, CAPILLARY
GLUCOSE-CAPILLARY: 124 mg/dL — AB (ref 70–99)
Glucose-Capillary: 133 mg/dL — ABNORMAL HIGH (ref 70–99)

## 2014-06-04 LAB — BASIC METABOLIC PANEL
ANION GAP: 10 (ref 5–15)
BUN: 16 mg/dL (ref 6–23)
CHLORIDE: 104 meq/L (ref 96–112)
CO2: 26 mEq/L (ref 19–32)
CREATININE: 1.17 mg/dL — AB (ref 0.50–1.10)
Calcium: 8.8 mg/dL (ref 8.4–10.5)
GFR calc Af Amer: 68 mL/min — ABNORMAL LOW (ref >90)
GFR calc non Af Amer: 58 mL/min — ABNORMAL LOW (ref >90)
Glucose, Bld: 128 mg/dL — ABNORMAL HIGH (ref 70–99)
POTASSIUM: 3.7 meq/L (ref 3.7–5.3)
Sodium: 140 mEq/L (ref 137–147)

## 2014-06-04 MED ORDER — AMLODIPINE BESYLATE 10 MG PO TABS: 10.0000 mg | ORAL_TABLET | Freq: Every day | ORAL | Status: DC

## 2014-06-04 MED ORDER — CARVEDILOL 25 MG PO TABS: 25.0000 mg | ORAL_TABLET | Freq: Two times a day (BID) | ORAL | Status: DC

## 2014-06-04 MED ORDER — FUROSEMIDE 40 MG PO TABS: 40.0000 mg | ORAL_TABLET | Freq: Two times a day (BID) | ORAL | Status: DC

## 2014-06-04 MED ORDER — FREESTYLE SYSTEM KIT: 1.0000 | PACK | Status: DC | PRN

## 2014-06-04 MED ORDER — "INSULIN SYRINGE-NEEDLE U-100 29G X 1/2"" 0.3 ML MISC": Status: DC

## 2014-06-04 MED ORDER — INSULIN ASPART PROT & ASPART (70-30 MIX) 100 UNIT/ML ~~LOC~~ SUSP: 25.0000 [IU] | Freq: Two times a day (BID) | SUBCUTANEOUS | Status: DC

## 2014-06-04 NOTE — Discharge Summary (Signed)
Physician Discharge Summary  Brittany Lopez UPJ:[id number] DOB: 09-23-1975 DOA: 05/30/2014  PCP: Leandrew Koyanagi, MD  Admit date: 05/30/2014 Discharge date: 06/04/2014  Time spent: Less than 30 minutes  Recommendations for Outpatient Follow-up:  1. Ena Dawley, Cardiology on 06/16/2014 at 11 AM. 2. Dr. Tami Lin, PCP in 5 days to be seen with repeat labs (CBC & BMP). Recommend repeating chest x-ray in 4-6 weeks to ensure resolution of abnormal findings seen on imaging in the hospital. 3. Outpatient diabetes education.  Discharge Diagnoses:  Principal Problem:   Acute respiratory failure with hypoxia Active Problems:   Type 2 diabetes mellitus with hyperglycemia   Uncontrolled hypertension   Acute diastolic CHF (congestive heart failure), NYHA class 3   Discharge Condition: Improved & Stable  Diet recommendation: Heart healthy and diabetic diet.  Filed Weights   06/02/14 0400 06/03/14 0529 06/04/14 0554  Weight: 138.8 kg (306 lb) 138.755 kg (305 lb 14.4 oz) 138.846 kg (306 lb 1.6 oz)    History of present illness:  38 year old female patient with history of DM 2, HTN, presented to the ER with complaints of diarrhea and weakness. She states that her symptoms started a couple of hours after eating at a [place] on 05/27/14. She denied nausea, vomiting, fever, chills or abdominal pain. In the ED, patient started having dyspnea. D-dimer was positive. CTA chest was negative for PE but suggested findings of pulmonary edema versus ARDS. Diarrhea resolved. Treated for decompensated CHF and acute respiratory failure which have improved.   Hospital Course:   1. Diarrhea: Likely acute viral GE versus food poisoning. Resolved after supportive treatment. Tolerating diet. C. difficile PCR negative. Stool negative for O&P. 2. Acute diastolic CHF: Precipitated by IVF and uncontrolled hypertension. Treated initially with IV Lasix. 2-D echo shows LVEF 50-55%, cannot exclude  inferior hypokinesis, grade 1 diastolic dysfunction and cannot exclude vegetations/mass or noncoronary or left coronary cusp.Cardiology followup appreciated. Creatinine increased 1.07 > 1.28 > 1.44. Changed Lasix to by mouth 9/23 PM. Much improved. Cardiology had seen today and cleared her for discharge home. Cardiology has arrange for close outpatient followup. 3. Acute respiratory failure with hypoxia: Likely precipitated by problem #2. CTA chest: No large PE. Resolved. No clinical pneumonia. DC levofloxacin. Chest x-ray improved. 4. Accelerated hypertension: Newly started carvedilol and titrated to 25 mg twice a day. Newly started amlodipine and titrated to 10 mg daily. Lisinopril has been temporarily held secondary to acute kidney injury-may consider resuming during outpatient followup. 5. Uncontrolled DM 2: A1c: 10.3. Patient was initially started on Lantus and NovoLog SSI. However due to affordability/lack of insurance, switched to 70/30 insulin 25 units 2 times daily. Better control. Metformin was held for 48 hours postcontrast and secondary to acute injury. Creatinine has normalized and metformin will be resumed at discharge. 6. Anemia: Possibly chronic. Stable 7. Mild leukocytosis: Likely stress response. Stable 8. ? AV calcification: As per cardiology input, echo images were reviewed and do not believe that she needs TEE at this time. Low index of suspicion for endocarditis. Calcified lesion makes endocarditis unlikely too. 9. Morbid obesity: Dietitian did see patient in hospital. Patient has been counseled regarding healthy lifestyle, diet and weight loss. She verbalized understanding. 10. Mild acute kidney injury: Secondary to diuresis-patient also on lisinopril and received contrast. Management as above. Creatinine peaked at 1.44 but has returned to 1.17 on discharge.  Consultations:  Cardiology  Procedures:  Foley catheter    Discharge Exam:  Complaints:  Denies complaints. No  dyspnea,  chest pain or palpitations.  Filed Vitals:   06/03/14 0529 06/03/14 0531 06/03/14 1400 06/04/14 0554  BP: 161/97 160/93 143/69 149/83  Pulse: 80  89 81  Temp: 98.4 F (36.9 C)  98 F (36.7 C) 97.7 F (36.5 C)  TempSrc: Oral  Oral Oral  Resp: '18  20 18  ' Height:      Weight: 138.755 kg (305 lb 14.4 oz)   138.846 kg (306 lb 1.6 oz)  SpO2: 99%  98% 97%   General exam: Moderately built and morbidly obese female sitting up comfortably in chair.  Respiratory system: clear to auscultation. No increased work of breathing.  Cardiovascular system: S1 & S2 heard, RRR. No JVD, murmurs, gallops, clicks. Trace bilateral leg edema-improved. Gastrointestinal system: Abdomen is nondistended, soft and nontender. Normal bowel sounds heard.  Central nervous system: Alert and oriented. No focal neurological deficits.  Extremities: Symmetric 5 x 5 power.   Discharge Instructions      Discharge Instructions   (HEART FAILURE PATIENTS) Call MD:  Anytime you have any of the following symptoms: 1) 3 pound weight gain in 24 hours or 5 pounds in 1 week 2) shortness of breath, with or without a dry hacking cough 3) swelling in the hands, feet or stomach 4) if you have to sleep on extra pillows at night in order to breathe.    Complete by:  As directed      Activity as tolerated - No restrictions    Complete by:  As directed      Ambulatory referral to Nutrition and Diabetic Education    Complete by:  As directed      Diet - low sodium heart healthy    Complete by:  As directed      Diet Carb Modified    Complete by:  As directed             Medication List    STOP taking these medications       lisinopril 20 MG tablet  Commonly known as:  PRINIVIL,ZESTRIL      TAKE these medications       amLODipine 10 MG tablet  Commonly known as:  NORVASC  Take 1 tablet (10 mg total) by mouth daily.     carvedilol 25 MG tablet  Commonly known as:  COREG  Take 1 tablet (25 mg total) by mouth 2  (two) times daily with a meal.     furosemide 40 MG tablet  Commonly known as:  LASIX  Take 1 tablet (40 mg total) by mouth 2 (two) times daily.     glucose monitoring kit monitoring kit  1 each by Does not apply route as needed for other.     insulin aspart protamine- aspart (70-30) 100 UNIT/ML injection  Commonly known as:  NOVOLOG MIX 70/30  Inject 0.25 mLs (25 Units total) into the skin 2 (two) times daily with a meal. May use Relion brand from [place].     Insulin Syringe-Needle U-100 29G X 1/2" 0.3 ML Misc  Commonly known as:  SAFETY-GLIDE 0.3CC SYR 29GX1/2  Use as directed.     metFORMIN 1000 MG tablet  Commonly known as:  GLUCOPHAGE  Take 1 tablet (1,000 mg total) by mouth 2 (two) times daily with a meal.       Follow-up Information   Follow up with Peter Martinique, MD. (The office will call.)    Specialty:  Cardiology   Contact information:   [address]  [place] [address] [phone]       Follow up with DOOLITTLE, Linton Ham, MD. Schedule an appointment as soon as possible for a visit in 5 days. (To be seen with repeat labs (CBC & BMP))    Specialties:  Internal Medicine, Adolescent Medicine   Contact information:   [address] [place] [address] [phone]        The results of significant diagnostics from this hospitalization (including imaging, microbiology, ancillary and laboratory) are listed below for reference.    Significant Diagnostic Studies: Dg Chest 2 View  06/01/2014   CLINICAL DATA:  CHF, followup, history hypertension, diabetes  EXAM: CHEST  2 VIEW  COMPARISON:  05/30/2014  FINDINGS: Enlargement of cardiac silhouette with pulmonary vascular congestion.  Improved interstitial edema since previous exam.  No segmental consolidation, pleural effusion or pneumothorax.  Bones unremarkable.  IMPRESSION: Improved pulmonary edema.   Electronically Signed   By: Lavonia Dana M.D.   On: 06/01/2014 08:37   Dg Chest 2 View  05/30/2014    CLINICAL DATA:  Short of breath.  Weakness.  EXAM: CHEST  2 VIEW  COMPARISON:  05/23/2010.  FINDINGS: Cardiac silhouette is normal in size. Normal mediastinal and hilar contours.  There is irregular interstitial thickening that is most evident the right lung. Mild thickening is noted of the fissures. No focal lung consolidation. No pleural effusion or pneumothorax.  Bony thorax is intact.  IMPRESSION: 1. Irregular interstitial thickening most evident in the right lung. Findings could reflect asymmetric interstitial edema. Interstitial infiltrate, either infectious or inflammatory, should be considered in the proper clinical setting. No evidence of lobar pneumonia. No pleural effusion.   Electronically Signed   By: Lajean Manes M.D.   On: 05/30/2014 21:50   Ct Angio Chest Pe W/cm &/or Wo Cm  05/31/2014   CLINICAL DATA:  Chest pain, hypoxia for 3 days with elevated D-dimer and elevated white blood count  EXAM: CT ANGIOGRAPHY CHEST WITH CONTRAST  TECHNIQUE: Multidetector CT imaging of the chest was performed using the standard protocol during bolus administration of intravenous contrast. Multiplanar CT image reconstructions and MIPs were obtained to evaluate the vascular anatomy.  CONTRAST:  161m OMNIPAQUE IOHEXOL 350 MG/ML SOLN  COMPARISON:  Chest radiograph 05/30/2014  FINDINGS: There is significant limitation in the evaluation of the pulmonary arterial system, primarily due to patient body habitus. There are no large central pulmonary emboli. Pulmonary artery branches beyond the hila are very difficult to evaluate and small pulmonary arterial emboli cannot be excluded. The thoracic aorta is not dilated.  Evaluation of pulmonary arteries is further limited by the presence of extensive bilateral parenchymal alveolar opacification. This is same bilaterally in all lung zones, most severe in the central perihilar areas bilaterally.  There is minimal right pleural fluid. No pericardial effusion or left pleural  effusion. There is mild cardiac enlargement.  Images through the upper abdomen are unremarkable. There are no acute musculoskeletal findings.  Review of the MIP images confirms the above findings.  IMPRESSION: Very limited evaluation of the pulmonary arterial system with no evidence of large central emboli.  Severe bilateral alveolar opacification. Distribution suggests the possibility acute severe pulmonary edema. Pulmonary hemorrhage and ARDS are also considerations.   Electronically Signed   By: RSkipper ClicheM.D.   On: 05/31/2014 02:01    Microbiology: Recent Results (from the past 240 hour(s))  OVA AND PARASITE EXAMINATION     Status: None   Collection Time    05/30/14  7:59  PM      Result Value Ref Range Status   Specimen Description STOOL   Final   Special Requests NONE   Final   Ova and parasites     Final   Value: NO OVA OR PARASITES SEEN     Performed at [facility]   Report Status 06/02/2014 FINAL   Final  MRSA PCR SCREENING     Status: Abnormal   Collection Time    05/31/14  3:34 AM      Result Value Ref Range Status   MRSA by PCR INVALID RESULTS, SPECIMEN SENT FOR CULTURE (*) NEGATIVE Final   Comment: NOTIFIED M. REEVES RN AT [id number] ON 09.21.15 BY SHUEA                The GeneXpert MRSA Assay (FDA     approved for NASAL specimens     only), is one component of a     comprehensive MRSA colonization     surveillance program. It is not     intended to diagnose MRSA     infection nor to guide or     monitor treatment for     MRSA infections.  MRSA CULTURE     Status: None   Collection Time    05/31/14  3:34 AM      Result Value Ref Range Status   Specimen Description NOSE   Final   Special Requests NONE   Final   Culture     Final   Value: FEW STAPHYLOCOCCUS AUREUS     Note: NOMRSA     Performed at [facility]   Report Status 06/01/2014 FINAL   Final  RESPIRATORY VIRUS PANEL     Status: None   Collection Time    05/31/14  4:39 AM      Result  Value Ref Range Status   Source - RVPAN NASAL SWAB   Corrected   Comment: CORRECTED ON 09/22 AT 2022: PREVIOUSLY REPORTED AS NASAL SWAB   Respiratory Syncytial Virus A NOT DETECTED   Final   Respiratory Syncytial Virus B NOT DETECTED   Final   Influenza A NOT DETECTED   Final   Influenza B NOT DETECTED   Final   Parainfluenza 1 NOT DETECTED   Final   Parainfluenza 2 NOT DETECTED   Final   Parainfluenza 3 NOT DETECTED   Final   Metapneumovirus NOT DETECTED   Final   Rhinovirus NOT DETECTED   Final   Adenovirus NOT DETECTED   Final   Influenza A H1 NOT DETECTED   Final   Influenza A H3 NOT DETECTED   Final   Comment: (NOTE)           Normal Reference Range for each Analyte: NOT DETECTED     Testing performed using the Luminex xTAG Respiratory Viral Panel test     kit.     The analytical performance characteristics of this assay have been     determined by [facility].  The modifications have not been     cleared or approved by the FDA. This assay has been validated pursuant     to the CLIA regulations and is used for clinical purposes.     Performed at [facility] DIFFICILE BY PCR     Status: None   Collection Time    05/31/14  8:07 PM      Result Value Ref Range Status   C difficile by  pcr NEGATIVE  NEGATIVE Final   Comment: Performed at [facility]: Basic Metabolic Panel:  Recent Labs Lab 05/31/14 0334 06/01/14 1001 06/02/14 0307 06/03/14 0433 06/04/14 0542  NA 138 135* 138 143 140  K 3.9 3.9 3.5* 3.9 3.7  CL 103 99 100 106 104  CO2 '24 24 27 26 26  ' GLUCOSE 288* 300* 248* 128* 128*  BUN '11 16 21 19 16  ' CREATININE 1.07 1.28* 1.44* 1.32* 1.17*  CALCIUM 8.4 8.7 8.5 8.6 8.8   Liver Function Tests:  Recent Labs Lab 05/31/14 0334  AST 10  9  ALT 10  10  ALKPHOS 72  69  BILITOT <0.2*  <0.2*  PROT 6.4  6.3  ALBUMIN 2.4*  2.4*    Recent Labs Lab 05/31/14 0334  LIPASE 39   No results found for this  basename: AMMONIA,  in the last 168 hours CBC:  Recent Labs Lab 05/30/14 2002 05/31/14 0334 06/01/14 1001  WBC 13.2* 13.8* 13.3*  NEUTROABS  --  9.1*  --   HGB 10.8* 9.7* 9.8*  HCT 33.7* 31.4* 31.2*  MCV 69.1* 71.0* 70.9*  PLT 239 245 237   Cardiac Enzymes:  Recent Labs Lab 05/31/14 0334  TROPONINI <0.30   BNP: BNP (last 3 results)  Recent Labs  05/31/14 0334 06/01/14 1001  PROBNP 1697.0* 753.9*   CBG:  Recent Labs Lab 06/03/14 0730 06/03/14 1146 06/03/14 1647 06/03/14 2119 06/04/14 0731  GLUCAP 153* 144* 150* 143* 133*    Additional labs: 1. 2-D echo 05/31/14: Study Conclusions  - Left ventricle: The cavity size was normal. Wall thickness was increased in a pattern of mild LVH. Systolic function was normal. The estimated ejection fraction was in the range of 50% to 55%. Cannot exclude hypokinesis of the basalinferior myocardium. Doppler parameters are consistent with abnormal left ventricular relaxation (grade 1 diastolic dysfunction). - Aortic valve: Probably trileaflet. Appears to be an area of moderate calcification involving the noncoronary or left coronary cusp and adjacent annulus. Not well seen, cannot completely exclude a calcified vegetation or mass. This could be further visualized by TEE, if felt to be clinically appropriate. Cusp separation was normal. There was no significant regurgitation. - Left atrium: The atrium was at the upper limits of normal in size. - Right atrium: Central venous pressure (est): 3 mm Hg. - Tricuspid valve: There was trivial regurgitation. - Pulmonary arteries: Systolic pressure could not be accurately estimated. - Pericardium, extracardiac: There was no pericardial effusion.  Impressions:  - Limited images overall. Mild LVH with LVEF approximately 50-55%, cannot exclude inferior hypokinesis, grade 1 diastolic dysfunction. Appears to be an area of moderate calcification involving the noncoronary or left  coronary cusp and adjacent annulus. Not well seen, cannot completely exclude a calcified vegetation or mass. This could be further visualized by TEE, if felt to be clinically appropriate. Trivial tricuspid regurgitation, unable to assess PASP.     Signed:  Vernell Leep, MD, FACP, FHM. [facility] Pager [phone]  If 7PM-7AM, please contact night-coverage [email or web address] Password [email or web address] 06/04/2014, 12:20 PM

## 2014-06-04 NOTE — Progress Notes (Signed)
Patient Name: Brittany Lopez Date of Encounter: 06/04/2014  Principal Problem:   Acute respiratory failure with hypoxia Active Problems:   Type 2 diabetes mellitus with hyperglycemia   Uncontrolled hypertension   Acute diastolic CHF (congestive heart failure), NYHA class 3   Length of Stay: 5  SUBJECTIVE  The patient feels significantly better, no SOB. No diarrhea for the last 3 days.  CURRENT MEDS . amLODipine  10 mg Oral Daily  . antiseptic oral rinse  7 mL Mouth Rinse q12n4p  . carvedilol  12.5 mg Oral BID WC  . chlorhexidine  15 mL Mouth Rinse BID  . enoxaparin (LOVENOX) injection  40 mg Subcutaneous Q24H  . furosemide  40 mg Oral BID  . insulin aspart  0-9 Units Subcutaneous TID WC  . insulin aspart protamine- aspart  25 Units Subcutaneous BID WC  . sodium chloride  3 mL Intravenous Q12H    OBJECTIVE  Filed Vitals:   06/03/14 0529 06/03/14 0531 06/03/14 1400 06/04/14 0554  BP: 161/97 160/93 143/69 149/83  Pulse: 80  89 81  Temp: 98.4 F (36.9 C)  98 F (36.7 C) 97.7 F (36.5 C)  TempSrc: Oral  Oral Oral  Resp: Height:      Weight: 305 lb 14.4 oz (138.755 kg)   306 lb 1.6 oz (138.846 kg)  SpO2: 99%  98% 97%    Intake/Output Summary (Last 24 hours) at 06/04/14 1042 Last data filed at 06/04/14 0555  Gross per 24 hour  Intake    600 ml  Output      0 ml  Net    600 ml   Filed Weights   06/02/14 0400 06/03/14 0529 06/04/14 0554  Weight: 306 lb (138.8 kg) 305 lb 14.4 oz (138.755 kg) 306 lb 1.6 oz (138.846 kg)    PHYSICAL EXAM  General: Pleasant, NAD. Neuro: Alert and oriented X 3. Moves all extremities spontaneously. Psych: Normal affect. HEENT:  Normal  Neck: Supple without bruits or JVD. Lungs:  Resp regular and unlabored, bilateral crackles up to mid lungs. Heart: RRR no s3, s4, or murmurs. Abdomen: Soft, non-tender, non-distended, BS + x 4.  Extremities: No clubbing, cyanosis or edema. DP/PT/Radials 2+ and equal  bilaterally.  Accessory Clinical Findings  CBC No results found for this basename: WBC, NEUTROABS, HGB, HCT, MCV, PLT,  in the last 72 hours Basic Metabolic Panel  Recent Labs  06/03/14 0433 06/04/14 0542  NA 143 140  K 3.9 3.7  CL 106 104  CO2 26 26  GLUCOSE 128* 128*  BUN 19 16  CREATININE 1.32* 1.17*  CALCIUM 8.6 8.8   Radiology/Studies  Dg Chest 2 View  06/01/2014   CLINICAL DATA:  CHF, followup, history hypertension, diabetes  EXAM: CHEST  2 VIEW  COMPARISON:  05/30/2014  FINDINGS: Enlargement of cardiac silhouette with pulmonary vascular congestion.  Improved interstitial edema since previous exam.  No segmental consolidation, pleural effusion or pneumothorax.  Bones unremarkable.  IMPRESSION: Improved pulmonary edema.   Electronically Signed   By: Ulyses Southward M.D.   On: 06/01/2014 08:37   Ct Angio Chest Pe W/cm &/or Wo Cm  05/31/2014   CLINICAL DATA:  Chest pain, hypoxia for 3 days with elevated D-dimer and elevated white blood count   IMPRESSION: Very limited evaluation of the pulmonary arterial system with no evidence of large central emboli.  Severe bilateral alveolar opacification. Distribution suggests the possibility acute severe pulmonary edema. Pulmonary hemorrhage and ARDS are  also considerations.     TELE: SR  ECG:   ECHO 05/31/2014 Left ventricle: The cavity size was normal. Wall thickness was increased in a pattern of mild LVH. Systolic function was normal. The estimated ejection fraction was in the range of 50% to 55%. Cannot exclude hypokinesis of the basalinferior myocardium. Doppler parameters are consistent with abnormal left ventricular relaxation (grade 1 diastolic dysfunction). - Aortic valve: Probably trileaflet. Appears to be an area of moderate calcification involving the noncoronary or left coronary cusp and adjacent annulus. Not well seen, cannot completely exclude a calcified vegetation or mass. This could be further visualized by TEE, if  felt to be clinically appropriate. Cusp separation was normal. There was no significant regurgitation. - Left atrium: The atrium was at the upper limits of normal in size. - Right atrium: Central venous pressure (est): 3 mm Hg. - Tricuspid valve: There was trivial regurgitation. - Pulmonary arteries: Systolic pressure could not be accurately estimated. - Pericardium, extracardiac: There was no pericardial effusion.  Impressions:  - Limited images overall. Mild LVH with LVEF approximately 50-55%, cannot exclude inferior hypokinesis, grade 1 diastolic dysfunction. Appears to be an area of moderate calcification involving the noncoronary or left coronary cusp and adjacent annulus. Not well seen, cannot completely exclude a calcified vegetation or mass. This could be further visualized by TEE, if felt to be clinically appropriate. Trivial tricuspid regurgitation, unable to assess PASP.     ASSESSMENT AND PLAN  38 year old [place] American female with past medical history significant for hypertension, diabetes and morbid obesity presented with diarrhea x 3 days and malignant hypertension. While in the ED, she began to have dyspnea and required nonrebreather.   1. Acute on chronic diastolic HF  - in the setting of uncontrolled HTN and 2 L IV fluid resuscitation with diarrhea - Crea improved, continue Lasix 40 mg po BID on discharge.  - Crea improving 1 -->1.2--> 1.4 --> 1.3 - need aggressive BP control - start physical therapy today  2. Uncontrolled HTN: increase carvedilol to 25 mg po bid  3. ?AV calcification  - Reviewed the echo image, does not believe she need TEE at this time. A-febrile, low suspicion for endocarditis. Calcified lesion make endocarditis unlikely too   4. Diabetes - poorly controlled, HbA1c 10.3%, on insulin now, possibly will require for home  5. Morbid obesity  Discharge today, we will arrange for an outpatient follow up in our clinic in 2 weeks.    Signed, Lars Masson MD, [facility] 06/04/2014

## 2014-06-04 NOTE — Care Management Note (Signed)
CARE MANAGEMENT NOTE 06/04/2014  Patient:  Brittany Lopez, Brittany Lopez   Account Number:  [id number]  Date Initiated:  05/31/2014  Documentation initiated by:  DAVIS,RHONDA  Subjective/Objective Assessment:   resp distress poss ards vs pulmonary edema/fi02 at 100% after o2 sats down to 70%     Action/Plan:   tbd   Anticipated DC Date:  06/03/2014   Anticipated DC Plan:  HOME/SELF CARE  In-house referral  Financial Counselor      DC Planning Services  CM consult      [facility] Choice  NA   Choice offered to / List presented to:  NA      DME agency  NA     HH arranged  NA      HH agency  NA   Status of service:  In process, will continue to follow Medicare Important Message given?   (If response is "NO", the following Medicare IM given date fields will be blank) Date Medicare IM given:   Medicare IM given by:   Date Additional Medicare IM given:   Additional Medicare IM given by:    Discharge Disposition:    Per UR Regulation:  Reviewed for med. necessity/level of care/duration of stay  If discussed at Long Length of Stay Meetings, dates discussed:    Comments:  06/04/14 Sandford Craze RN,BSN,NCM Was asked by MD to see pt regarding affording her insulin for home.  Pt states that she can afford the 70/30 that he mentioned to her.  She states that she does not have insurance and goes to [place] urgent care for her PCP.  I attempted to set her up with the [facility] and she seemed hesitant.  I explained to her that she needed a solid PCP and that [facility] could set her up with insurance and help her get her meds.  She stated that she would think about it and was thankful for the info that I gave her.  No other CM needs noted at this time.  [id number] Davis,RN,BSn,CCM

## 2014-06-04 NOTE — Plan of Care (Signed)
Problem: Food- and Nutrition-Related Knowledge Deficit (NB-1.1) Goal: Nutrition education Formal process to instruct or train a patient/client in a skill or to impart knowledge to help patients/clients voluntarily manage or modify food choices and eating behavior to maintain or improve health. Outcome: Completed/Met Date Met:  06/04/14  RD consulted for nutrition education regarding diabetes.     Lab Results  Component Value Date    HGBA1C 10.3* 05/31/2014    RD provided "Carbohydrate Counting for People with Diabetes" handout from the Academy of [employer]etics. Discussed different food groups and their effects on blood sugar, emphasizing carbohydrate-containing foods. Provided list of carbohydrates and recommended serving sizes of common foods.  Discussed importance of controlled and consistent carbohydrate intake throughout the day. Provided examples of ways to balance meals/snacks and encouraged intake of high-fiber, whole grain complex carbohydrates. Teach back method used.  Expect good compliance. Pt has recently purchased the [employer] Diabetes meal planning program. Program provides pt with portion controlled meals and snacks, along with appropriate serving size containers to assist in compliance. Pt was educated by Diabetes Coordinator previously regarding insulin medications. Diet recall indicates pt has been trying to implement healthier snacks- fruits, yogurt, and vegetables w/hummus. Enjoys starchy vegetables, such as corn and potatoes; however verbalized understanding in importance of portion control of these types of foods. Reviewed "MyPlate Method" for additional portion control reinforcement, and meal planning ideas. Also had several questions regarding daily sodium intake  Body mass index is 55.97 kg/(m^2). Pt meets criteria for morbid obesity/Obesity III based on current BMI.  Current diet order is Carb Mod/Heart healthy, patient is consuming approximately 100% of  meals at this time. Labs and medications reviewed. No further nutrition interventions warranted at this time. RD contact information provided. If additional nutrition issues arise, please re-consult RD.  Atlee Abide MS RD LDN Clinical Dietitian CHENI:[phone]

## 2014-06-08 ENCOUNTER — Ambulatory Visit (INDEPENDENT_AMBULATORY_CARE_PROVIDER_SITE_OTHER): Payer: Self-pay | Admitting: Family Medicine

## 2014-06-08 VITALS — BP 143/85 | HR 86 | Temp 98.2°F | Resp 20 | Ht 64.5 in | Wt 295.0 lb

## 2014-06-08 DIAGNOSIS — E869 Volume depletion, unspecified: Secondary | ICD-10-CM

## 2014-06-08 DIAGNOSIS — D649 Anemia, unspecified: Secondary | ICD-10-CM

## 2014-06-08 DIAGNOSIS — E1169 Type 2 diabetes mellitus with other specified complication: Secondary | ICD-10-CM

## 2014-06-08 DIAGNOSIS — I5031 Acute diastolic (congestive) heart failure: Secondary | ICD-10-CM

## 2014-06-08 DIAGNOSIS — E1165 Type 2 diabetes mellitus with hyperglycemia: Secondary | ICD-10-CM

## 2014-06-08 DIAGNOSIS — E11649 Type 2 diabetes mellitus with hypoglycemia without coma: Secondary | ICD-10-CM

## 2014-06-08 DIAGNOSIS — I10 Essential (primary) hypertension: Secondary | ICD-10-CM

## 2014-06-08 DIAGNOSIS — E119 Type 2 diabetes mellitus without complications: Secondary | ICD-10-CM

## 2014-06-08 LAB — POCT CBC
Granulocyte percent: 51.4 %G (ref 37–80)
HCT, POC: 35.3 % — AB (ref 37.7–47.9)
Hemoglobin: 10.3 g/dL — AB (ref 12.2–16.2)
Lymph, poc: 5.4 — AB (ref 0.6–3.4)
MCH, POC: 21.5 pg — AB (ref 27–31.2)
MCHC: 29.3 g/dL — AB (ref 31.8–35.4)
MCV: 73.4 fL — AB (ref 80–97)
MID (cbc): 1.2 — AB (ref 0–0.9)
MPV: 10.7 fL (ref 0–99.8)
PLATELET COUNT, POC: 255 10*3/uL (ref 142–424)
POC Granulocyte: 6.9 (ref 2–6.9)
POC LYMPH PERCENT: 40 %L (ref 10–50)
POC MID %: 8.6 % (ref 0–12)
RBC: 4.81 M/uL (ref 4.04–5.48)
RDW, POC: 17.9 %
WBC: 13.4 10*3/uL — AB (ref 4.6–10.2)

## 2014-06-08 LAB — POCT UA - MICROSCOPIC ONLY
Bacteria, U Microscopic: NEGATIVE
Casts, Ur, LPF, POC: NEGATIVE
Crystals, Ur, HPF, POC: NEGATIVE
MUCUS UA: NEGATIVE
RBC, urine, microscopic: NEGATIVE
YEAST UA: NEGATIVE

## 2014-06-08 LAB — POCT URINALYSIS DIPSTICK
Bilirubin, UA: NEGATIVE
Blood, UA: NEGATIVE
GLUCOSE UA: NEGATIVE
Ketones, UA: NEGATIVE
Leukocytes, UA: NEGATIVE
Nitrite, UA: NEGATIVE
PH UA: 5.5
Spec Grav, UA: 1.02
Urobilinogen, UA: 0.2

## 2014-06-08 NOTE — Progress Notes (Deleted)
   Subjective:    Patient ID: Brittany BlalockLatoria M Lopez, female    DOB: 06/24/1976, 38 y.o.   MRN: [id number]  HPI    Review of Systems     Objective:   Physical Exam        Assessment & Plan:

## 2014-06-08 NOTE — Patient Instructions (Signed)
Decrease the insulin to 15 units twice daily. If your blood sugars are running consistently greater than 100 then increase back to 20 units twice daily  Continue to monitor sugar closely  Decrease the furosemide to once daily  Continues as the medications  Return in one week

## 2014-06-08 NOTE — Progress Notes (Signed)
Subjective: 38 year old lady who was just released from hospital. She is patient of Dr. Netta Corrigan. She was released 4 days ago from the hospital having been admitted with possible food poisoning causing diarrhea. She had poorly controlled diabetes. While in the hospital she was begun on insulin. She was sent home on the insulin and metformin, though she thinks she wasn't doing metformin the hospital. She got feeling worse since she went home. She was given a lot of fluids in the hospital which caused her to have some heart failure. She was sent home on twice a day Lasix. She does not complain of any ankle edema. She has not been orthostatic dizzy. She has no chest pain or palpitations. No major GI complaints now. Her sugars running from 70 to 130s. She feels fatigued and weak and bad.  Objective: Obese lady in no major distress. She looks tired, but it is 10 PM. TMs normal. Throat clear. Wears a nose ring. Neck supple without nodes. Chest clear to auscultation. Heart regular without murmurs. Not tachycardic. Abdomen soft without masses or tenderness. No ankle edema.  Assessment: Diabetes with possible iatrogenic hypoglycemia Congestive heart failure, possibly over diuresed Fatigue and generalized malaise Obesity htn Anemia    Plan: Check urinalysis and BMET  Decrease the Lasix to one daily Decrease the insulin to 15 units twice daily Continue to monitor her sugars. If the sugar starts going too high she can increase back to 20 units twice daily  Return in one week for recheck  Results for orders placed in visit on 06/08/14  POCT URINALYSIS DIPSTICK      Result Value Ref Range   Color, UA yellow     Clarity, UA clear     Glucose, UA neg     Bilirubin, UA neg     Ketones, UA neg     Spec Grav, UA 1.020     Blood, UA neg     pH, UA 5.5     Protein, UA >=300     Urobilinogen, UA 0.2     Nitrite, UA neg     Leukocytes, UA Negative    POCT UA - MICROSCOPIC ONLY      Result Value  Ref Range   WBC, Ur, HPF, POC 1-3     RBC, urine, microscopic neg     Bacteria, U Microscopic neg     Mucus, UA neg     Epithelial cells, urine per micros 0-1     Crystals, Ur, HPF, POC neg     Casts, Ur, LPF, POC neg     Yeast, UA neg    POCT CBC      Result Value Ref Range   WBC 13.4 (*) 4.6 - 10.2 K/uL   Lymph, poc 5.4 (*) 0.6 - 3.4   POC LYMPH PERCENT 40.0  10 - 50 %L   MID (cbc) 1.2 (*) 0 - 0.9   POC MID % 8.6  0 - 12 %M   POC Granulocyte 6.9  2 - 6.9   Granulocyte percent 51.4  37 - 80 %G   RBC 4.81  4.04 - 5.48 M/uL   Hemoglobin 10.3 (*) 12.2 - 16.2 g/dL   HCT, POC 16.1 (*) 09.6 - 47.9 %   MCV 73.4 (*) 80 - 97 fL   MCH, POC 21.5 (*) 27 - 31.2 pg   MCHC 29.3 (*) 31.8 - 35.4 g/dL   RDW, POC 04.5     Platelet Count, POC 255  142 -  424 K/uL   MPV 10.7  0 - 99.8 fL   Will decrease her Lasix and insulin and see how she feels.  Mild orthostatic changes were noted.

## 2014-06-09 LAB — BASIC METABOLIC PANEL WITH GFR
BUN: 15 mg/dL (ref 6–23)
CHLORIDE: 102 meq/L (ref 96–112)
CO2: 26 mEq/L (ref 19–32)
Calcium: 8.8 mg/dL (ref 8.4–10.5)
Creat: 1.29 mg/dL — ABNORMAL HIGH (ref 0.50–1.10)
GFR, EST AFRICAN AMERICAN: 61 mL/min
GFR, EST NON AFRICAN AMERICAN: 53 mL/min — AB
Glucose, Bld: 149 mg/dL — ABNORMAL HIGH (ref 70–99)
POTASSIUM: 3.9 meq/L (ref 3.5–5.3)
SODIUM: 138 meq/L (ref 135–145)

## 2014-06-14 ENCOUNTER — Encounter: Payer: Self-pay | Admitting: Internal Medicine

## 2014-06-15 ENCOUNTER — Encounter: Payer: Self-pay | Admitting: *Deleted

## 2014-06-16 ENCOUNTER — Encounter: Payer: Self-pay | Admitting: Cardiology

## 2014-06-16 ENCOUNTER — Ambulatory Visit (INDEPENDENT_AMBULATORY_CARE_PROVIDER_SITE_OTHER): Payer: Self-pay | Admitting: Cardiology

## 2014-06-16 VITALS — BP 142/90 | HR 72 | Ht 64.5 in | Wt 295.0 lb

## 2014-06-16 DIAGNOSIS — I5031 Acute diastolic (congestive) heart failure: Secondary | ICD-10-CM

## 2014-06-16 DIAGNOSIS — I10 Essential (primary) hypertension: Secondary | ICD-10-CM

## 2014-06-16 LAB — BASIC METABOLIC PANEL
BUN: 17 mg/dL (ref 6–23)
CO2: 25 mEq/L (ref 19–32)
Calcium: 9 mg/dL (ref 8.4–10.5)
Chloride: 104 mEq/L (ref 96–112)
Creatinine, Ser: 1.1 mg/dL (ref 0.4–1.2)
GFR: 71.87 mL/min (ref >60.00)
Glucose, Bld: 151 mg/dL — ABNORMAL HIGH (ref 70–99)
Potassium: 3.9 mEq/L (ref 3.5–5.1)
Sodium: 137 mEq/L (ref 135–145)

## 2014-06-16 NOTE — Progress Notes (Signed)
Patient Name: Brittany Lopez Date of Encounter: 06/16/2014  Primary Care Provider:  Leandrew Koyanagi, MD Primary Cardiologist:  Dorothy Spark   Problem List   Past Medical History  Diagnosis Date  . Diabetes mellitus without complication   . Hypertension   . Obesity    Past Surgical History  Procedure Laterality Date  . Cesarean section      Allergies  No Known Allergies  HPI  38 year old [place] American female with past medical history significant for hypertension, diabetes and morbid obesity admitted on 06/04/14 with diarrhea x 3 days and malignant hypertension. While in the ED, she began to have dyspnea and required nonrebreather. She was treated for acute on chronic diastolic HF in the setting of uncontrolled HTN and 2 L IV fluid resuscitation with diarrhea. Continued Lasix 40 mg po BID on discharge, Crea improving 1 -->1.2--> 1.4 --> 1.3, need aggressive BP control, start physical therapy.  06/17/14 - 1.post hospitalization visit, she is feeling whole lot better, no SOB, no orthopnea, PND, no chest pain. She is complaint with her meds. She has mild residual LE edema.  Home Medications  Prior to Admission medications   Medication Sig Start Date End Date Taking? Authorizing Provider  amLODipine (NORVASC) 10 MG tablet Take 1 tablet (10 mg total) by mouth daily. 06/04/14  Yes Modena Jansky, MD  carvedilol (COREG) 25 MG tablet Take 1 tablet (25 mg total) by mouth 2 (two) times daily with a meal. 06/04/14  Yes Modena Jansky, MD  furosemide (LASIX) 40 MG tablet Take 40 mg by mouth daily. 06/04/14  Yes Modena Jansky, MD  insulin aspart protamine- aspart (NOVOLOG MIX 70/30) (70-30) 100 UNIT/ML injection Inject 15 Units into the skin 2 (two) times daily with a meal. May use Relion brand from [place]. 06/04/14  Yes Modena Jansky, MD  metFORMIN (GLUCOPHAGE) 1000 MG tablet Take 1 tablet (1,000 mg total) by mouth 2 (two) times daily with a meal. 03/29/14  Yes  Elby Beck, FNP  glucose monitoring kit (FREESTYLE) monitoring kit 1 each by Does not apply route as needed for other. 06/04/14   Modena Jansky, MD  Insulin Syringe-Needle U-100 (SAFETY-GLIDE 0.3CC SYR 29GX1/2) 29G X 1/2" 0.3 ML MISC Use as directed. 06/04/14   Modena Jansky, MD    Family History  Family History  Problem Relation Age of Onset  . Diabetes Mother   . Heart disease Mother   . Hypertension Mother   . Stroke Mother   . Sjogren's syndrome Mother     Social History  History   Social History  . Marital Status: Married    Spouse Name: N/A    Number of Children: N/A  . Years of Education: N/A   Occupational History  . Not on file.   Social History Main Topics  . Smoking status: Never Smoker   . Smokeless tobacco: Not on file  . Alcohol Use: No  . Drug Use: No  . Sexual Activity: Not on file   Other Topics Concern  . Not on file   Social History Narrative  . No narrative on file     Review of Systems, as per HPI, otherwise negative General:  No chills, fever, night sweats or weight changes.  Cardiovascular:  No chest pain, dyspnea on exertion, edema, orthopnea, palpitations, paroxysmal nocturnal dyspnea. Dermatological: No rash, lesions/masses Respiratory: No cough, dyspnea Urologic: No hematuria, dysuria Abdominal:   No nausea, vomiting, diarrhea, bright red blood  per rectum, melena, or hematemesis Neurologic:  No visual changes, wkns, changes in mental status. All other systems reviewed and are otherwise negative except as noted above.  Physical Exam  Blood pressure 142/90, pulse 72, height 5' 4.5" (1.638 m), weight 295 lb (133.811 kg), last menstrual period 05/26/2014.  General: Pleasant, NAD Psych: Normal affect. Neuro: Alert and oriented X 3. Moves all extremities spontaneously. HEENT: Normal  Neck: Supple without bruits or JVD. Lungs:  Resp regular and unlabored, CTA. Heart: RRR no s3, s4, or murmurs. Abdomen: Soft, non-tender,  non-distended, BS + x 4.  Extremities: No clubbing, cyanosis or edema. DP/PT/Radials 2+ and equal bilaterally.  Labs:  No results found for this basename: CKTOTAL, CKMB, TROPONINI,  in the last 72 hours Lab Results  Component Value Date   WBC 13.4* 06/08/2014   HGB 10.3* 06/08/2014   HCT 35.3* 06/08/2014   MCV 73.4* 06/08/2014   PLT 237 06/01/2014    Lab Results  Component Value Date   DDIMER 1.27* 05/30/2014   No components found with this basename: POCBNP,     Component Value Date/Time   NA 138 06/08/2014 2152   K 3.9 06/08/2014 2152   CL 102 06/08/2014 2152   CO2 26 06/08/2014 2152   GLUCOSE 149* 06/08/2014 2152   BUN 15 06/08/2014 2152   CREATININE 1.29* 06/08/2014 2152   CREATININE 1.17* 06/04/2014 0542   CALCIUM 8.8 06/08/2014 2152   PROT 6.4 05/31/2014 0334   PROT 6.3 05/31/2014 0334   ALBUMIN 2.4* 05/31/2014 0334   ALBUMIN 2.4* 05/31/2014 0334   AST 10 05/31/2014 0334   AST 9 05/31/2014 0334   ALT 10 05/31/2014 0334   ALT 10 05/31/2014 0334   ALKPHOS 72 05/31/2014 0334   ALKPHOS 69 05/31/2014 0334   BILITOT <0.2* 05/31/2014 0334   BILITOT <0.2* 05/31/2014 0334   GFRNONAA 53* 06/08/2014 2152   GFRNONAA 58* 06/04/2014 0542   GFRAA 61 06/08/2014 2152   GFRAA 68* 06/04/2014 0542   Lab Results  Component Value Date   CHOL  Value: 123        ATP III CLASSIFICATION:  <200     mg/dL   Desirable  200-239  mg/dL   Borderline High  >=240    mg/dL   High        05/24/2010   HDL 32* 05/24/2010   LDLCALC  Value: 73        Total Cholesterol/HDL:CHD Risk Coronary Heart Disease Risk Table                     Men   Women  1/2 Average Risk   3.4   3.3  Average Risk       5.0   4.4  2 X Average Risk   9.6   7.1  3 X Average Risk  23.4   11.0        Use the calculated Patient Ratio above and the CHD Risk Table to determine the patient's CHD Risk.        ATP III CLASSIFICATION (LDL):  <100     mg/dL   Optimal  100-129  mg/dL   Near or Above                    Optimal  130-159  mg/dL   Borderline  160-189  mg/dL    High  >190     mg/dL   Very High 05/24/2010   TRIG 91 05/24/2010  Accessory Clinical Findings  echocardiogram   ECHO 05/31/2014 Left ventricle: The cavity size was normal. Wall thickness was increased in a pattern of mild LVH. Systolic function was normal. The estimated ejection fraction was in the range of 50% to 55%. Cannot exclude hypokinesis of the basalinferior myocardium. Doppler parameters are consistent with abnormal left ventricular relaxation (grade 1 diastolic dysfunction). - Aortic valve: Probably trileaflet. Appears to be an area of moderate calcification involving the noncoronary or left coronary cusp and adjacent annulus. Not well seen, cannot completely exclude a calcified vegetation or mass. This could be further visualized by TEE, if felt to be clinically appropriate. Cusp separation was normal. There was no significant regurgitation. - Left atrium: The atrium was at the upper limits of normal in size. - Right atrium: Central venous pressure (est): 3 mm Hg. - Tricuspid valve: There was trivial regurgitation. - Pulmonary arteries: Systolic pressure could not be accurately estimated. - Pericardium, extracardiac: There was no pericardial effusion.  Impressions:  - Limited images overall. Mild LVH with LVEF approximately 50-55%, cannot exclude inferior hypokinesis, grade 1 diastolic dysfunction. Appears to be an area of moderate calcification involving the noncoronary or left coronary cusp and adjacent annulus. Not well seen, cannot completely exclude a calcified vegetation or mass. This could be further visualized by TEE, if felt to be clinically appropriate. Trivial tricuspid regurgitation, unable to assess PASP.     ASSESSMENT AND PLAN  38 year old [place] American female with past medical history significant for hypertension, diabetes and morbid obesity hospitalized on 06/04/14 for acute on chronic CHF secondary to malignant hypertension.   1. Acute on  chronic diastolic HF  - in the setting of uncontrolled HTN and 2 L IV fluid resuscitation with diarrhea - Crea improved, continue Lasix 40 mg po BID on discharge, continue the same, check BMP today.  - Crea improving 1 -->1.2--> 1.4 --> 1.3,  - need aggressive BP control  2. Controlled HTN: continue the same regimen  3. ?AV calcification  - Reviewed the echo image, does not believe she need TEE at this time. A-febrile, low suspicion for endocarditis. Calcified lesion make endocarditis unlikely too   4. Diabetes - poorly controlled, HbA1c 10.3%, on insulin now, possibly will require for home  5. Morbid obesity  Follow up in 2 months  Signed, Dorothy Spark MD, [facility] 06/16/2014

## 2014-06-16 NOTE — Patient Instructions (Signed)
Your physician recommends that you continue on your current medications as directed. Please refer to the Current Medication list given to you today.   Your physician recommends that you return for lab work in: TODAY - [employer](BMET)   Your physician recommends that you schedule a follow-up appointment in: IN 2 MONTHS WITH DR Delton SeeNELSON

## 2014-06-23 ENCOUNTER — Ambulatory Visit (INDEPENDENT_AMBULATORY_CARE_PROVIDER_SITE_OTHER): Payer: Self-pay | Admitting: Emergency Medicine

## 2014-06-23 VITALS — BP 196/100 | HR 88 | Temp 98.2°F | Resp 16 | Ht 62.75 in | Wt 295.0 lb

## 2014-06-23 DIAGNOSIS — I10 Essential (primary) hypertension: Secondary | ICD-10-CM

## 2014-06-23 DIAGNOSIS — E1165 Type 2 diabetes mellitus with hyperglycemia: Secondary | ICD-10-CM

## 2014-06-23 DIAGNOSIS — I5031 Acute diastolic (congestive) heart failure: Secondary | ICD-10-CM

## 2014-06-23 MED ORDER — CARVEDILOL 25 MG PO TABS: 25.0000 mg | ORAL_TABLET | Freq: Two times a day (BID) | ORAL | Status: DC

## 2014-06-23 MED ORDER — METFORMIN HCL 1000 MG PO TABS: 1000.0000 mg | ORAL_TABLET | Freq: Two times a day (BID) | ORAL | Status: DC

## 2014-06-23 MED ORDER — HYDROCHLOROTHIAZIDE 25 MG PO TABS: 25.0000 mg | ORAL_TABLET | Freq: Every day | ORAL | Status: DC

## 2014-06-23 MED ORDER — AMLODIPINE BESYLATE 10 MG PO TABS: 10.0000 mg | ORAL_TABLET | Freq: Every day | ORAL | Status: DC

## 2014-06-23 MED ORDER — INSULIN ASPART PROT & ASPART (70-30 MIX) 100 UNIT/ML ~~LOC~~ SUSP: 15.0000 [IU] | Freq: Two times a day (BID) | SUBCUTANEOUS | Status: DC

## 2014-06-23 MED ORDER — FUROSEMIDE 40 MG PO TABS: 40.0000 mg | ORAL_TABLET | Freq: Every day | ORAL | Status: DC

## 2014-06-23 NOTE — Addendum Note (Signed)
Addended by: Carmelina DaneANDERSON, Amariana Mirando S on: 06/23/2014 06:45 PM   Modules accepted: Orders

## 2014-06-23 NOTE — Patient Instructions (Signed)

## 2014-06-23 NOTE — Progress Notes (Signed)
[facility] [address], [place] [address] [phone]  Date:  06/23/2014   Name:  Brittany Lopez   DOB:  05-Feb-1976   MRN:  [id number]  PCP:  Leandrew Koyanagi, MD    Chief Complaint: Follow-up and Medication Refill   History of Present Illness:  Brittany Lopez is a 38 y.o. very pleasant female patient who presents with the following:  Hospitalized with hypertension and failure.  Discharged and follow up with Dr Linna Darner twice and adjustments made in her medication Seen by cardiology 1 week ago and on all these visits her pressure was well controlled. Stopped lasix on Dr [employer] last visit. Sugars running in the 80's fasting. No chest tightness.  No orthopnea or PND.  Sleeps flat.  Exertional shortness of breath really unchanged. Says the orthostatic dizziness she experienced has resolved. No improvement with over the counter medications or other home remedies.  Denies other complaint or health concern today.   Patient Active Problem List   Diagnosis Date Noted  . Acute diastolic CHF (congestive heart failure), NYHA class 3 06/01/2014  . Hypoxia 05/31/2014  . Acute respiratory failure with hypoxia 05/31/2014  . Uncontrolled hypertension 05/31/2014  . Type 2 diabetes mellitus with hyperglycemia 03/30/2014  . Essential hypertension 03/30/2014    Past Medical History  Diagnosis Date  . Diabetes mellitus without complication   . Hypertension   . Obesity     Past Surgical History  Procedure Laterality Date  . Cesarean section      History  Substance Use Topics  . Smoking status: Never Smoker   . Smokeless tobacco: Not on file  . Alcohol Use: No    Family History  Problem Relation Age of Onset  . Diabetes Mother   . Heart disease Mother   . Hypertension Mother   . Stroke Mother   . Sjogren's syndrome Mother     No Known Allergies  Medication list has been reviewed and updated.  Current Outpatient Prescriptions on File  Prior to Visit  Medication Sig Dispense Refill  . amLODipine (NORVASC) 10 MG tablet Take 1 tablet (10 mg total) by mouth daily.  30 tablet  0  . carvedilol (COREG) 25 MG tablet Take 1 tablet (25 mg total) by mouth 2 (two) times daily with a meal.  60 tablet  0  . furosemide (LASIX) 40 MG tablet Take 40 mg by mouth daily.      Marland Kitchen glucose monitoring kit (FREESTYLE) monitoring kit 1 each by Does not apply route as needed for other.  1 each  0  . insulin aspart protamine- aspart (NOVOLOG MIX 70/30) (70-30) 100 UNIT/ML injection Inject 15 Units into the skin 2 (two) times daily with a meal. May use Relion brand from [place].      . Insulin Syringe-Needle U-100 (SAFETY-GLIDE 0.3CC SYR 29GX1/2) 29G X 1/2" 0.3 ML MISC Use as directed.  200 each  0  . metFORMIN (GLUCOPHAGE) 1000 MG tablet Take 1 tablet (1,000 mg total) by mouth 2 (two) times daily with a meal.  60 tablet  3   No current facility-administered medications on file prior to visit.    Review of Systems:  As per HPI, otherwise negative.    Physical Examination: Filed Vitals:   06/23/14 1811  BP: 196/100  Pulse: 88  Temp: 98.2 F (36.8 C)  Resp: 16   Filed Vitals:   06/23/14 1811  Height: 5' 2.75" (1.594 m)  Weight: 295 lb (133.811  kg)   Body mass index is 52.66 kg/(m^2). Ideal Body Weight: Weight in (lb) to have BMI = 25: 139.7  GEN: morbidly obese, NAD, Non-toxic, A & O x 3 HEENT: Atraumatic, Normocephalic. Neck supple. No masses, No LAD. Ears and Nose: No external deformity. CV: RRR, No M/G/R. No JVD. No thrill. No extra heart sounds. PULM: CTA B, no wheezes, crackles, rhonchi. No retractions. No resp. distress. No accessory muscle use. ABD: S, NT, ND, +BS. No rebound. No HSM. EXTR: No c/c/e NEURO Normal gait.  PSYCH: Normally interactive. Conversant. Not depressed or anxious appearing.  Calm demeanor.    Assessment and Plan: Morbid obesity Type 2 diabetes Hypertension CHF Follow up Friday for repeat  BP   Signed,  Ellison Carwin, MD

## 2014-06-25 ENCOUNTER — Ambulatory Visit (INDEPENDENT_AMBULATORY_CARE_PROVIDER_SITE_OTHER): Payer: Self-pay | Admitting: Internal Medicine

## 2014-06-25 VITALS — BP 152/80 | HR 90 | Temp 98.8°F | Resp 18 | Ht 64.0 in | Wt 291.0 lb

## 2014-06-25 DIAGNOSIS — Z7189 Other specified counseling: Secondary | ICD-10-CM

## 2014-06-25 DIAGNOSIS — E119 Type 2 diabetes mellitus without complications: Secondary | ICD-10-CM

## 2014-06-25 DIAGNOSIS — Z7184 Encounter for health counseling related to travel: Secondary | ICD-10-CM

## 2014-06-25 DIAGNOSIS — Z79899 Other long term (current) drug therapy: Secondary | ICD-10-CM

## 2014-06-25 DIAGNOSIS — T753XXA Motion sickness, initial encounter: Secondary | ICD-10-CM

## 2014-06-25 DIAGNOSIS — D649 Anemia, unspecified: Secondary | ICD-10-CM

## 2014-06-25 LAB — COMPREHENSIVE METABOLIC PANEL
ALBUMIN: 3.5 g/dL (ref 3.5–5.2)
ALT: <8 U/L (ref 0–35)
AST: 10 U/L (ref 0–37)
Alkaline Phosphatase: 61 U/L (ref 39–117)
BUN: 21 mg/dL (ref 6–23)
CALCIUM: 9.1 mg/dL (ref 8.4–10.5)
CO2: 26 mEq/L (ref 19–32)
CREATININE: 1.18 mg/dL — AB (ref 0.50–1.10)
Chloride: 102 mEq/L (ref 96–112)
GLUCOSE: 148 mg/dL — AB (ref 70–99)
POTASSIUM: 3.8 meq/L (ref 3.5–5.3)
Sodium: 137 mEq/L (ref 135–145)
Total Bilirubin: 0.3 mg/dL (ref 0.2–1.2)
Total Protein: 6.5 g/dL (ref 6.0–8.3)

## 2014-06-25 LAB — GLUCOSE, POCT (MANUAL RESULT ENTRY): POC GLUCOSE: 142 mg/dL — AB (ref 70–99)

## 2014-06-25 LAB — POCT CBC
Granulocyte percent: 59.3 %G (ref 37–80)
HEMATOCRIT: 32.5 % — AB (ref 37.7–47.9)
HEMOGLOBIN: 10.1 g/dL — AB (ref 12.2–16.2)
LYMPH, POC: 3.8 — AB (ref 0.6–3.4)
MCH: 22.1 pg — AB (ref 27–31.2)
MCHC: 31 g/dL — AB (ref 31.8–35.4)
MCV: 71.2 fL — AB (ref 80–97)
MID (cbc): 0.8 (ref 0–0.9)
MPV: 9.9 fL (ref 0–99.8)
POC GRANULOCYTE: 6.7 (ref 2–6.9)
POC LYMPH %: 33.5 % (ref 10–50)
POC MID %: 7.2 %M (ref 0–12)
Platelet Count, POC: 201 10*3/uL (ref 142–424)
RBC: 4.56 M/uL (ref 4.04–5.48)
RDW, POC: 17.5 %
WBC: 11.3 10*3/uL — AB (ref 4.6–10.2)

## 2014-06-25 LAB — POCT GLYCOSYLATED HEMOGLOBIN (HGB A1C): Hemoglobin A1C: 8.1

## 2014-06-25 MED ORDER — SCOPOLAMINE 1 MG/3DAYS TD PT72: 1.0000 | MEDICATED_PATCH | TRANSDERMAL | Status: DC

## 2014-06-25 NOTE — Patient Instructions (Signed)
Immunization Schedule, Adult  Influenza vaccine.  All adults should be immunized every year.  All adults, including pregnant women and people with hives-only allergy to eggs can receive the inactivated influenza (IIV) vaccine.  Adults aged 39-49 years can receive the recombinant influenza (RIV) vaccine. The RIV vaccine does not contain any egg protein.  Adults aged 76 years or older can receive the standard-dose IIV or the high-dose IIV.  Tetanus, diphtheria, and acellular pertussis (Td, Tdap) vaccine.  Pregnant women should receive 1 dose of Tdap vaccine during each pregnancy. The dose should be obtained regardless of the length of time since the last dose. Immunization is preferred during the 27th to 36th week of gestation.  An adult who has not previously received Tdap or who does not know his or her vaccine status should receive 1 dose of Tdap. This initial dose should be followed by tetanus and diphtheria toxoids (Td) booster doses every 10 years.  Adults with an unknown or incomplete history of completing a 3-dose immunization series with Td-containing vaccines should begin or complete a primary immunization series including a Tdap dose.  Adults should receive a Td booster every 10 years.  Varicella vaccine.  An adult without evidence of immunity to varicella should receive 2 doses or a second dose if he or she has previously received 1 dose.  Pregnant females who do not have evidence of immunity should receive the first dose after pregnancy. This first dose should be obtained before leaving the health care facility. The second dose should be obtained 4-8 weeks after the first dose.  Human papillomavirus (HPV) vaccine.  Females aged 13-26 years who have not received the vaccine previously should obtain the 3-dose series.  The vaccine is not recommended for use in pregnant females. However, pregnancy testing is not needed before receiving a dose. If a female is found to be  pregnant after receiving a dose, no treatment is needed. In that case, the remaining doses should be delayed until after the pregnancy.  Males aged 37-21 years who have not received the vaccine previously should receive the 3-dose series. Males aged 22-26 years may be immunized.  Immunization is recommended through the age of 93 years for any female who has sex with males and did not get any or all doses earlier.  Immunization is recommended for any person with an immunocompromised condition through the age of 71 years if he or she did not get any or all doses earlier.  During the 3-dose series, the second dose should be obtained 4-8 weeks after the first dose. The third dose should be obtained 24 weeks after the first dose and 16 weeks after the second dose.  Zoster vaccine.  One dose is recommended for adults aged 73 years or older unless certain conditions are present.  Measles, mumps, and rubella (MMR) vaccine.  Adults born before 35 generally are considered immune to measles and mumps.  Adults born in [birth year] or later should have 1 or more doses of MMR vaccine unless there is a contraindication to the vaccine or there is laboratory evidence of immunity to each of the three diseases.  A routine second dose of MMR vaccine should be obtained at least 28 days after the first dose for students attending postsecondary schools, health care workers, or international travelers.  People who received inactivated measles vaccine or an unknown type of measles vaccine during 1963-1967 should receive 2 doses of MMR vaccine.  People who received inactivated mumps vaccine or an unknown type  of mumps vaccine before 1979 and are at high risk for mumps infection should consider immunization with 2 doses of MMR vaccine.  For females of childbearing age, rubella immunity should be determined. If there is no evidence of immunity, females who are not pregnant should be vaccinated. If there is no evidence of  immunity, females who are pregnant should delay immunization until after pregnancy.  Unvaccinated health care workers born before 1957 who lack laboratory evidence of measles, mumps, or rubella immunity or laboratory confirmation of disease should consider measles and mumps immunization with 2 doses of MMR vaccine or rubella immunization with 1 dose of MMR vaccine.  Pneumococcal 13-valent conjugate (PCV13) vaccine.  When indicated, a person who is uncertain of his or her immunization history and has no record of immunization should receive the PCV13 vaccine.  An adult aged 19 years or older who has certain medical conditions and has not been previously immunized should receive 1 dose of PCV13 vaccine. This PCV13 should be followed with a dose of pneumococcal polysaccharide (PPSV23) vaccine. The PPSV23 vaccine dose should be obtained at least 8 weeks after the dose of PCV13 vaccine.  An adult aged 19 years or older who has certain medical conditions and previously received 1 or more doses of PPSV23 vaccine should receive 1 dose of PCV13. The PCV13 vaccine dose should be obtained 1 or more years after the last PPSV23 vaccine dose.  Pneumococcal polysaccharide (PPSV23) vaccine.  When PCV13 is also indicated, PCV13 should be obtained first.  All adults aged 65 years and older should be immunized.  An adult younger than age 65 years who has certain medical conditions should be immunized.  Any person who resides in a nursing home or long-term care facility should be immunized.  An adult smoker should be immunized.  People with an immunocompromised condition and certain other conditions should receive both PCV13 and PPSV23 vaccines.  People with human immunodeficiency virus (HIV) infection should be immunized as soon as possible after diagnosis.  Immunization during chemotherapy or radiation therapy should be avoided.  Routine use of PPSV23 vaccine is not recommended for American Indians,  [place]atives, or people younger than 65 years unless there are medical conditions that require PPSV23 vaccine.  When indicated, people who have unknown immunization and have no record of immunization should receive PPSV23 vaccine.  One-time revaccination 5 years after the first dose of PPSV23 is recommended for people aged 19-64 years who have chronic kidney failure, nephrotic syndrome, asplenia, or immunocompromised conditions.  People who received 1-2 doses of PPSV23 before age 65 years should receive another dose of PPSV23 vaccine at age 65 years or later if at least 5 years have passed since the previous dose.  Doses of PPSV23 are not needed for people immunized with PPSV23 at or after age 65 years.  Meningococcal vaccine.  Adults with asplenia or persistent complement component deficiencies should receive 2 doses of quadrivalent meningococcal conjugate (MenACWY-D) vaccine. The doses should be obtained at least 2 months apart.  Microbiologists working with certain meningococcal bacteria, military recruits, people at risk during an outbreak, and people who travel to or live in countries with a high rate of meningitis should be immunized.  A first-year college student up through age 21 years who is living in a residence hall should receive a dose if he or she did not receive a dose on or after his or her 16th birthday.  Adults who have certain high-risk conditions should receive one or more doses   of vaccine.  Hepatitis A vaccine.  Adults who wish to be protected from this disease, have certain high-risk conditions, work with hepatitis A-infected animals, work in hepatitis A research labs, or travel to or work in countries with a high rate of hepatitis A should be immunized.  Adults who were previously unvaccinated and who anticipate close contact with an international adoptee during the first 60 days after arrival in the [place] from a country with a high rate of hepatitis A should  be immunized.  Hepatitis B vaccine.  Adults who wish to be protected from this disease, have certain high-risk conditions, may be exposed to blood or other infectious body fluids, are household contacts or sex partners of hepatitis B positive people, are clients or workers in certain care facilities, or travel to or work in countries with a high rate of hepatitis B should be immunized.  Haemophilus influenzae type b (Hib) vaccine.  A previously unvaccinated person with asplenia or sickle cell disease or having a scheduled splenectomy should receive 1 dose of Hib vaccine.  Regardless of previous immunization, a recipient of a hematopoietic stem cell transplant should receive a 3-dose series 6-12 months after his or her successful transplant.  Hib vaccine is not recommended for adults with HIV infection. Document Released: 11/17/2003 Document Revised: 12/22/2012 Document Reviewed: 10/14/2012 [facility] Patient Information 2015 [place], [place]. This information is not intended to replace advice given to you by your health care provider. Make sure you discuss any questions you have with your health care provider. Diabetes and Exercise Exercising regularly is important. It is not just about losing weight. It has many health benefits, such as:  Improving your overall fitness, flexibility, and endurance.  Increasing your bone density.  Helping with weight control.  Decreasing your body fat.  Increasing your muscle strength.  Reducing stress and tension.  Improving your overall health. People with diabetes who exercise gain additional benefits because exercise:  Reduces appetite.  Improves the body's use of blood sugar (glucose).  Helps lower or control blood glucose.  Decreases blood pressure.  Helps control blood lipids (such as cholesterol and triglycerides).  Improves the body's use of the hormone insulin by:  Increasing the body's insulin sensitivity.  Reducing the body's  insulin needs.  Decreases the risk for heart disease because exercising:  Lowers cholesterol and triglycerides levels.  Increases the levels of good cholesterol (such as high-density lipoproteins [HDL]) in the body.  Lowers blood glucose levels. YOUR ACTIVITY PLAN  Choose an activity that you enjoy and set realistic goals. Your health care provider or diabetes educator can help you make an activity plan that works for you. Exercise regularly as directed by your health care provider. This includes:  Performing resistance training twice a week such as push-ups, sit-ups, lifting weights, or using resistance bands.  Performing 150 minutes of cardio exercises each week such as walking, running, or playing sports.  Staying active and spending no more than 90 minutes at one time being inactive. Even short bursts of exercise are good for you. Three 10-minute sessions spread throughout the day are just as beneficial as a single 30-minute session. Some exercise ideas include:  Taking the dog for a walk.  Taking the stairs instead of the elevator.  Dancing to your favorite song.  Doing an exercise video.  Doing your favorite exercise with a friend. RECOMMENDATIONS FOR EXERCISING WITH TYPE 1 OR TYPE 2 DIABETES   Check your blood glucose before exercising. If blood glucose levels are  greater than 240 mg/dL, check for urine ketones. Do not exercise if ketones are present.  Avoid injecting insulin into areas of the body that are going to be exercised. For example, avoid injecting insulin into:  The arms when playing tennis.  The legs when jogging.  Keep a record of:  Food intake before and after you exercise.  Expected peak times of insulin action.  Blood glucose levels before and after you exercise.  The type and amount of exercise you have done.  Review your records with your health care provider. Your health care provider will help you to develop guidelines for adjusting food  intake and insulin amounts before and after exercising.  If you take insulin or oral hypoglycemic agents, watch for signs and symptoms of hypoglycemia. They include:  Dizziness.  Shaking.  Sweating.  Chills.  Confusion.  Drink plenty of water while you exercise to prevent dehydration or heat stroke. Body water is lost during exercise and must be replaced.  Talk to your health care provider before starting an exercise program to make sure it is safe for you. Remember, almost any type of activity is better than none. Document Released: 11/17/2003 Document Revised: 01/11/2014 Document Reviewed: 02/03/2013 [facility] Patient Information 2015 [place], [place]. This information is not intended to replace advice given to you by your health care provider. Make sure you discuss any questions you have with your health care provider.

## 2014-06-25 NOTE — Progress Notes (Signed)
   Subjective:    Patient ID: Brittany Lopez, female    DOB: 12/25/1975, 38 y.o.   MRN: [id number]  HPI Doing well, feeling well, going on a cruise, needs motion sickness meds. Diabetes doing better, scheduled to see nutritionist in November. HTN much better. Meds reviewed and on 2 diuretics. New HCTZ, not listed in epic. Last A1c 10.5  Review of Systems     Objective:   Physical Exam  Vitals reviewed. Constitutional: She is oriented to person, place, and time. She appears well-nourished. No distress.  HENT:  Head: Normocephalic.  Mouth/Throat: Oropharynx is clear and moist.  Eyes: EOM are normal. No scleral icterus.  Neck: Normal range of motion.  Cardiovascular: Normal rate, regular rhythm, normal heart sounds and intact distal pulses.  Exam reveals no gallop.   No murmur heard. Pulmonary/Chest: Effort normal and breath sounds normal.  Musculoskeletal: Normal range of motion.  Lymphadenopathy:    She has no cervical adenopathy.  Neurological: She is alert and oriented to person, place, and time. She exhibits normal muscle tone. Coordination normal.  Psychiatric: She has a normal mood and affect. Her behavior is normal. Judgment and thought content normal.   Does not want flu shot  Results for orders placed in visit on 06/25/14  POCT CBC      Result Value Ref Range   WBC 11.3 (*) 4.6 - 10.2 K/uL   Lymph, poc 3.8 (*) 0.6 - 3.4   POC LYMPH PERCENT 33.5  10 - 50 %L   MID (cbc) 0.8  0 - 0.9   POC MID % 7.2  0 - 12 %M   POC Granulocyte 6.7  2 - 6.9   Granulocyte percent 59.3  37 - 80 %G   RBC 4.56  4.04 - 5.48 M/uL   Hemoglobin 10.1 (*) 12.2 - 16.2 g/dL   HCT, POC 40.932.5 (*) 81.137.7 - 47.9 %   MCV 71.2 (*) 80 - 97 fL   MCH, POC 22.1 (*) 27 - 31.2 pg   MCHC 31.0 (*) 31.8 - 35.4 g/dL   RDW, POC 91.417.5     Platelet Count, POC 201  142 - 424 K/uL   MPV 9.9  0 - 99.8 fL  GLUCOSE, POCT (MANUAL RESULT ENTRY)      Result Value Ref Range   POC Glucose 142 (*) 70 - 99 mg/dl  POCT  GLYCOSYLATED HEMOGLOBIN (HGB A1C)      Result Value Ref Range   Hemoglobin A1C 8.1          Assessment & Plan:  DC HCTZ/Use furosemide only for diuretic See cardiology inf/up, Dr. Delton SeeNelson soon Continues all meds listed Transderm scope prn motion sickness RF meds 6 months Schedule appt with your primary care 3 month See Gyn for your irregular menses and anemia

## 2014-06-26 ENCOUNTER — Encounter: Payer: Self-pay | Admitting: *Deleted

## 2014-07-04 ENCOUNTER — Other Ambulatory Visit: Payer: Self-pay | Admitting: Family Medicine

## 2014-07-04 MED ORDER — CARVEDILOL 25 MG PO TABS: 25.0000 mg | ORAL_TABLET | Freq: Two times a day (BID) | ORAL | Status: DC

## 2014-07-04 MED ORDER — AMLODIPINE BESYLATE 10 MG PO TABS: 10.0000 mg | ORAL_TABLET | Freq: Every day | ORAL | Status: DC

## 2014-07-12 ENCOUNTER — Ambulatory Visit: Payer: Self-pay | Admitting: *Deleted

## 2014-08-19 ENCOUNTER — Telehealth: Payer: Self-pay | Admitting: Family Medicine

## 2014-08-19 NOTE — Telephone Encounter (Signed)
Patient declined  

## 2014-08-23 ENCOUNTER — Ambulatory Visit (INDEPENDENT_AMBULATORY_CARE_PROVIDER_SITE_OTHER): Payer: Self-pay | Admitting: Cardiology

## 2014-08-23 ENCOUNTER — Encounter: Payer: Self-pay | Admitting: Cardiology

## 2014-08-23 VITALS — BP 132/88 | HR 91 | Ht 64.0 in | Wt 283.0 lb

## 2014-08-23 DIAGNOSIS — I5032 Chronic diastolic (congestive) heart failure: Secondary | ICD-10-CM

## 2014-08-23 DIAGNOSIS — I10 Essential (primary) hypertension: Secondary | ICD-10-CM

## 2014-08-23 LAB — CBC
HCT: 30.4 % — ABNORMAL LOW (ref 36.0–46.0)
Hemoglobin: 9.5 g/dL — ABNORMAL LOW (ref 12.0–15.0)
MCHC: 31.2 g/dL (ref 30.0–36.0)
MCV: 65.2 fl — ABNORMAL LOW (ref 78.0–100.0)
Platelets: 204 10*3/uL (ref 150.0–400.0)
RBC: 4.65 Mil/uL (ref 3.87–5.11)
RDW: 18.1 % — ABNORMAL HIGH (ref 11.5–15.5)
WBC: 12.4 10*3/uL — ABNORMAL HIGH (ref 4.0–10.5)

## 2014-08-23 LAB — BASIC METABOLIC PANEL WITH GFR
BUN: 11 mg/dL (ref 6–23)
CO2: 25 meq/L (ref 19–32)
Calcium: 8.9 mg/dL (ref 8.4–10.5)
Chloride: 106 meq/L (ref 96–112)
Creatinine, Ser: 0.9 mg/dL (ref 0.4–1.2)
GFR: 85.17 mL/min
Glucose, Bld: 136 mg/dL — ABNORMAL HIGH (ref 70–99)
Potassium: 3.7 meq/L (ref 3.5–5.1)
Sodium: 137 meq/L (ref 135–145)

## 2014-08-23 NOTE — Patient Instructions (Signed)
Your physician wants you to follow-up in: 4 months. You will receive a reminder letter in the mail two months in advance. If you don't receive a letter, please call our office to schedule the follow-up appointment.  

## 2014-08-23 NOTE — Progress Notes (Signed)
Patient ID: Brittany Lopez, female   DOB: June 08, 1976, 38 y.o.   MRN: [id number]    Patient Name: Brittany Lopez Date of Encounter: 08/23/2014  Primary Care Provider:  Leandrew Koyanagi, MD Primary Cardiologist:  Dorothy Spark   Problem List   Past Medical History  Diagnosis Date  . Diabetes mellitus without complication   . Hypertension   . Obesity    Past Surgical History  Procedure Laterality Date  . Cesarean section      Allergies  No Known Allergies  HPI  38 year old [place] American female with past medical history significant for hypertension, diabetes and morbid obesity admitted on 06/04/14 with diarrhea x 3 days and malignant hypertension. While in the ED, she began to have dyspnea and required nonrebreather. She was treated for acute on chronic diastolic HF in the setting of uncontrolled HTN and 2 L IV fluid resuscitation with diarrhea. Continued Lasix 40 mg po BID on discharge, Crea improving 1 -->1.2--> 1.4 --> 1.3, need aggressive BP control, start physical therapy.  06/17/14 - 1.post hospitalization visit, she is feeling whole lot better, no SOB, no orthopnea, PND, no chest pain. She is complaint with her meds. She has mild residual LE edema.   08/23/2014 -  The patient is coming after 2 months. She feels significantly better and feels that she can perform much more  Exercise , she goes to gym twice a week and is able to exercise on treadmill for half an hour. In between she is doing Zumba at home.  She is very compliant with her medication. She doesn't feel like she has any side effects from them. Recently her insulin dose was decreased as she was getting hypoglycemic episodes and confusion. She denies any shortness of breath at rest no orthopnea or proximal nocturnal dyspnea. She also denies any palpitations or syncope. She is compliant with her medicines and started to eat Mediterranean style diet.  Home Medications  Prior to Admission medications     Medication Sig Start Date End Date Taking? Authorizing Provider  amLODipine (NORVASC) 10 MG tablet Take 1 tablet (10 mg total) by mouth daily. 06/04/14  Yes Modena Jansky, MD  carvedilol (COREG) 25 MG tablet Take 1 tablet (25 mg total) by mouth 2 (two) times daily with a meal. 06/04/14  Yes Modena Jansky, MD  furosemide (LASIX) 40 MG tablet Take 40 mg by mouth daily. 06/04/14  Yes Modena Jansky, MD  insulin aspart protamine- aspart (NOVOLOG MIX 70/30) (70-30) 100 UNIT/ML injection Inject 15 Units into the skin 2 (two) times daily with a meal. May use Relion brand from [place]. 06/04/14  Yes Modena Jansky, MD  metFORMIN (GLUCOPHAGE) 1000 MG tablet Take 1 tablet (1,000 mg total) by mouth 2 (two) times daily with a meal. 03/29/14  Yes Elby Beck, FNP  glucose monitoring kit (FREESTYLE) monitoring kit 1 each by Does not apply route as needed for other. 06/04/14   Modena Jansky, MD  Insulin Syringe-Needle U-100 (SAFETY-GLIDE 0.3CC SYR 29GX1/2) 29G X 1/2" 0.3 ML MISC Use as directed. 06/04/14   Modena Jansky, MD    Family History  Family History  Problem Relation Age of Onset  . Diabetes Mother   . Heart disease Mother   . Hypertension Mother   . Stroke Mother   . Sjogren's syndrome Mother     Social History  History   Social History  . Marital Status: Married    Spouse Name: N/A  Number of Children: N/A  . Years of Education: N/A   Occupational History  . Not on file.   Social History Main Topics  . Smoking status: Never Smoker   . Smokeless tobacco: Not on file  . Alcohol Use: No  . Drug Use: No  . Sexual Activity: Not on file   Other Topics Concern  . Not on file   Social History Narrative     Review of Systems, as per HPI, otherwise negative General:  No chills, fever, night sweats or weight changes.  Cardiovascular:  No chest pain, dyspnea on exertion, edema, orthopnea, palpitations, paroxysmal nocturnal dyspnea. Dermatological: No rash,  lesions/masses Respiratory: No cough, dyspnea Urologic: No hematuria, dysuria Abdominal:   No nausea, vomiting, diarrhea, bright red blood per rectum, melena, or hematemesis Neurologic:  No visual changes, wkns, changes in mental status. All other systems reviewed and are otherwise negative except as noted above.  Physical Exam  Blood pressure 132/88, pulse 91, height '5\' 4"'  (1.626 m), weight 283 lb (128.368 kg), SpO2 98 %.  General: Pleasant, NAD Psych: Normal affect. Neuro: Alert and oriented X 3. Moves all extremities spontaneously. HEENT: Normal  Neck: Supple without bruits or JVD. Lungs:  Resp regular and unlabored, CTA. Heart: RRR no s3, s4, or murmurs. Abdomen: Soft, non-tender, non-distended, BS + x 4.  Extremities: No clubbing, cyanosis or edema. DP/PT/Radials 2+ and equal bilaterally.  Labs:  No results for input(s): CKTOTAL, CKMB, TROPONINI in the last 72 hours. Lab Results  Component Value Date   WBC 11.3* 06/25/2014   HGB 10.1* 06/25/2014   HCT 32.5* 06/25/2014   MCV 71.2* 06/25/2014   PLT 237 06/01/2014    Lab Results  Component Value Date   DDIMER 1.27* 05/30/2014   Invalid input(s): POCBNP    Component Value Date/Time   NA 137 06/25/2014 0834   K 3.8 06/25/2014 0834   CL 102 06/25/2014 0834   CO2 26 06/25/2014 0834   GLUCOSE 148* 06/25/2014 0834   BUN 21 06/25/2014 0834   CREATININE 1.18* 06/25/2014 0834   CREATININE 1.1 06/16/2014 1200   CALCIUM 9.1 06/25/2014 0834   PROT 6.5 06/25/2014 0834   ALBUMIN 3.5 06/25/2014 0834   AST 10 06/25/2014 0834   ALT <8 06/25/2014 0834   ALKPHOS 61 06/25/2014 0834   BILITOT 0.3 06/25/2014 0834   GFRNONAA 53* 06/08/2014 2152   GFRNONAA 58* 06/04/2014 0542   GFRAA 61 06/08/2014 2152   GFRAA 68* 06/04/2014 0542   Lab Results  Component Value Date   CHOL  05/24/2010    123        ATP III CLASSIFICATION:  <200     mg/dL   Desirable  200-239  mg/dL   Borderline High  >=240    mg/dL   High          HDL 32*  05/24/2010   LDLCALC  05/24/2010    73        Total Cholesterol/HDL:CHD Risk Coronary Heart Disease Risk Table                     Men   Women  1/2 Average Risk   3.4   3.3  Average Risk       5.0   4.4  2 X Average Risk   9.6   7.1  3 X Average Risk  23.4   11.0        Use the calculated Patient Ratio above and the CHD  Risk Table to determine the patient's CHD Risk.        ATP III CLASSIFICATION (LDL):  <100     mg/dL   Optimal  100-129  mg/dL   Near or Above                    Optimal  130-159  mg/dL   Borderline  160-189  mg/dL   High  >190     mg/dL   Very High   TRIG 91 05/24/2010    Accessory Clinical Findings  echocardiogram   ECHO 05/31/2014 Left ventricle: The cavity size was normal. Wall thickness was increased in a pattern of mild LVH. Systolic function was normal. The estimated ejection fraction was in the range of 50% to 55%. Cannot exclude hypokinesis of the basalinferior myocardium. Doppler parameters are consistent with abnormal left ventricular relaxation (grade 1 diastolic dysfunction). - Aortic valve: Probably trileaflet. Appears to be an area of moderate calcification involving the noncoronary or left coronary cusp and adjacent annulus. Not well seen, cannot completely exclude a calcified vegetation or mass. This could be further visualized by TEE, if felt to be clinically appropriate. Cusp separation was normal. There was no significant regurgitation. - Left atrium: The atrium was at the upper limits of normal in size. - Right atrium: Central venous pressure (est): 3 mm Hg. - Tricuspid valve: There was trivial regurgitation. - Pulmonary arteries: Systolic pressure could not be accurately estimated. - Pericardium, extracardiac: There was no pericardial effusion.  Impressions:  - Limited images overall. Mild LVH with LVEF approximately 50-55%, cannot exclude inferior hypokinesis, grade 1 diastolic dysfunction. Appears to be an area of moderate  calcification involving the noncoronary or left coronary cusp and adjacent annulus. Not well seen, cannot completely exclude a calcified vegetation or mass. This could be further visualized by TEE, if felt to be clinically appropriate. Trivial tricuspid regurgitation, unable to assess PASP.     ASSESSMENT AND PLAN  38 year old [place] American female with past medical history significant for hypertension, diabetes and morbid obesity hospitalized on 06/04/14 for acute on chronic CHF secondary to malignant hypertension.   1. Acute on chronic diastolic HF  - in the setting of uncontrolled HTN and 2 L IV fluid resuscitation with diarrhea - Crea improved, continue Lasix 40 mg po BID on discharge, now Lasix 40 mg PO daily. check BMP today, also CBC as microcytic anemia in October.  - Crea improving 1 -->1.2--> 1.4 --> 1.3--> 1.18,  - need aggressive BP control  2. Controlled HTN: continue the same regimen  3. ?AV calcification  - Reviewed the echo image, does not believe she need TEE at this time. A-febrile, low suspicion for endocarditis. Calcified lesion make endocarditis unlikely too   4. Diabetes - poorly controlled, HbA1c 10.3%, on insulin now, possibly will require for home  5. Morbid obesity - encouraged to further increase exercise level to 30 minutes 5 x week. She is motivated, also advised on diet.  Follow up in 4 months, BMP and CBC today.  Signed, Dorothy Spark MD, [facility] 06/16/2014

## 2014-08-24 ENCOUNTER — Other Ambulatory Visit: Payer: Self-pay

## 2014-08-24 MED ORDER — FERROUS SULFATE 325 (65 FE) MG PO TABS: 325.0000 mg | ORAL_TABLET | Freq: Three times a day (TID) | ORAL | Status: DC

## 2014-08-24 MED ORDER — FUROSEMIDE 40 MG PO TABS: 40.0000 mg | ORAL_TABLET | Freq: Every day | ORAL | Status: DC

## 2014-11-06 ENCOUNTER — Other Ambulatory Visit: Payer: Self-pay | Admitting: Emergency Medicine

## 2014-11-06 ENCOUNTER — Ambulatory Visit (INDEPENDENT_AMBULATORY_CARE_PROVIDER_SITE_OTHER): Payer: BLUE CROSS/BLUE SHIELD | Admitting: Emergency Medicine

## 2014-11-06 VITALS — BP 170/100 | HR 86 | Temp 98.5°F | Resp 16 | Ht 64.0 in | Wt 247.0 lb

## 2014-11-06 DIAGNOSIS — I10 Essential (primary) hypertension: Secondary | ICD-10-CM

## 2014-11-06 DIAGNOSIS — D6489 Other specified anemias: Secondary | ICD-10-CM

## 2014-11-06 DIAGNOSIS — D573 Sickle-cell trait: Secondary | ICD-10-CM

## 2014-11-06 DIAGNOSIS — R809 Proteinuria, unspecified: Secondary | ICD-10-CM

## 2014-11-06 DIAGNOSIS — E118 Type 2 diabetes mellitus with unspecified complications: Secondary | ICD-10-CM

## 2014-11-06 LAB — POCT CBC
GRANULOCYTE PERCENT: 58.4 % (ref 37–80)
HEMATOCRIT: 32 % — AB (ref 37.7–47.9)
Hemoglobin: 10 g/dL — AB (ref 12.2–16.2)
LYMPH, POC: 4.5 — AB (ref 0.6–3.4)
MCH: 20.2 pg — AB (ref 27–31.2)
MCHC: 31.1 g/dL — AB (ref 31.8–35.4)
MCV: 64.9 fL — AB (ref 80–97)
MID (cbc): 0.7 (ref 0–0.9)
MPV: 10.3 fL (ref 0–99.8)
PLATELET COUNT, POC: 287 10*3/uL (ref 142–424)
POC GRANULOCYTE: 7.3 — AB (ref 2–6.9)
POC LYMPH %: 36 % (ref 10–50)
POC MID %: 5.6 %M (ref 0–12)
RBC: 4.93 M/uL (ref 4.04–5.48)
RDW, POC: 19.8 %
WBC: 12.5 10*3/uL — AB (ref 4.6–10.2)

## 2014-11-06 LAB — GLUCOSE, POCT (MANUAL RESULT ENTRY): POC Glucose: 104 mg/dl — AB (ref 70–99)

## 2014-11-06 LAB — POCT GLYCOSYLATED HEMOGLOBIN (HGB A1C): Hemoglobin A1C: 6.2

## 2014-11-06 NOTE — Progress Notes (Addendum)
Subjective:    Patient ID: Brittany Lopez, female    DOB: 1975-12-26, 39 y.o.   MRN: [id number]  HPI Chief Complaint  Patient presents with  . Hypertension    Blood pressure running high x 3 days   This chart was scribed for Arlyss Queen, MD by Thea Alken, ED Scribe. This patient was seen in room 1 and the patient's care was started at 3:57 PM.  HPI Comments:complaining  Brittany Lopez is a 39 y.o. female who presents to the [facility] of elevated BP. Pt's DM and HTN is managed by, PCP Dr. Laney Pastor. She has tried to make an appointment with him but states he has been booked.  Pt states she has had difficulty controlling her BP. She has been taking her medication. She reports her sugars have been low, last night being 56 and in the 70's prior to being seen today. Pt is seen by an optometrist once a year and states she is due for a visit. Pt is still menstruating and states her periods are light. Pt is unsure the last time her iron levels were check. Pt states she recently changed her diet, eliminating red meat from her diet. She eats chicken occasionally.  Past Medical History  Diagnosis Date  . Diabetes mellitus without complication   . Hypertension   . Obesity    Past Surgical History  Procedure Laterality Date  . Cesarean section     Prior to Admission medications   Medication Sig Start Date End Date Taking? Authorizing Provider  amLODipine (NORVASC) 10 MG tablet Take 1 tablet (10 mg total) by mouth daily. 07/04/14  Yes Thao P Le, DO  ferrous sulfate 325 (65 FE) MG tablet Take 1 tablet (325 mg total) by mouth 3 (three) times daily with meals. 08/24/14  Yes Dorothy Spark, MD  furosemide (LASIX) 40 MG tablet Take 1 tablet (40 mg total) by mouth daily. 08/24/14  Yes Dorothy Spark, MD  glucose monitoring kit (FREESTYLE) monitoring kit 1 each by Does not apply route as needed for other. 06/04/14  Yes Modena Jansky, MD  insulin aspart protamine-  aspart (NOVOLOG MIX 70/30) (70-30) 100 UNIT/ML injection Inject 0.15 mLs (15 Units total) into the skin 2 (two) times daily with a meal. May use Relion brand from [place]. 06/23/14  Yes Roselee Culver, MD  Insulin Syringe-Needle U-100 (SAFETY-GLIDE 0.3CC SYR 29GX1/2) 29G X 1/2" 0.3 ML MISC Use as directed. 06/04/14  Yes Modena Jansky, MD  metFORMIN (GLUCOPHAGE) 1000 MG tablet Take 1 tablet (1,000 mg total) by mouth 2 (two) times daily with a meal. 06/23/14  Yes Roselee Culver, MD  carvedilol (COREG) 25 MG tablet Take 1 tablet (25 mg total) by mouth 2 (two) times daily with a meal. 07/04/14   Thao P Le, DO  scopolamine (TRANSDERM-SCOP) 1 MG/3DAYS Place 1 patch (1.5 mg total) onto the skin every 3 (three) days. Patient not taking: Reported on 11/06/2014 06/25/14   Orma Flaming, MD   Review of Systems  Eyes: Negative for visual disturbance.    Objective:   Physical Exam CONSTITUTIONAL: Well developed/well nourished HEAD: Normocephalic/atraumatic, EYES: EOMI/PERRL conjunctiva is very pale.  ENMT: Mucous membranes moist NECK: supple no meningeal signs SPINE/BACK:entire spine nontender CV: S1/S2 noted, no murmurs/rubs/gallops noted BP 180/90 LUNGS: Lungs are clear to auscultation bilaterally, no apparent distress ABDOMEN: soft, nontender, no rebound or guarding, bowel sounds noted throughout abdomen GU:no cva tenderness NEURO: Pt is awake/alert/appropriate,  moves all extremitiesx4.  No facial droop.   EXTREMITIES: pulses normal/equal, full ROM, trace edema, good pulses, normal sensation. SKIN: warm, color normal PSYCH: no abnormalities of mood noted, alert and oriented to situatio Results for orders placed or performed in visit on 11/06/14  POCT CBC  Result Value Ref Range   WBC 12.5 (A) 4.6 - 10.2 K/uL   Lymph, poc 4.5 (A) 0.6 - 3.4   POC LYMPH PERCENT 36.0 10 - 50 %L   MID (cbc) 0.7 0 - 0.9   POC MID % 5.6 0 - 12 %M   POC Granulocyte 7.3 (A) 2 - 6.9   Granulocyte percent  58.4 37 - 80 %G   RBC 4.93 4.04 - 5.48 M/uL   Hemoglobin 10.0 (A) 12.2 - 16.2 g/dL   HCT, POC 32.0 (A) 37.7 - 47.9 %   MCV 64.9 (A) 80 - 97 fL   MCH, POC 20.2 (A) 27 - 31.2 pg   MCHC 31.1 (A) 31.8 - 35.4 g/dL   RDW, POC 19.8 %   Platelet Count, POC 287 142 - 424 K/uL   MPV 10.3 0 - 99.8 fL  POCT glucose (manual entry)  Result Value Ref Range   POC Glucose 104 (A) 70 - 99 mg/dl  POCT glycosylated hemoglobin (Hb A1C)  Result Value Ref Range   Hemoglobin A1C 6.2      Assessment & Plan:  Referral made to hematology to consider iron infusion. Referral made back to cardiology for adjustment of blood pressure medicines. No change in diabetes treatment.I personally performed the services described in this documentation, which was scribed in my presence. The recorded information has been reviewed and is accurate I sent her back to Dr. Meda Coffee to change her blood pressure medications. She is already on Coreg, Lasix, and amlodipine. She has a history of respiratory failure and congestive heart failure I felt it would be more appropriate for Dr. Meda Coffee to adjust her medicines. I referred her to a hematologist about her anemia. She does have a family history of G6PD deficiency. A level for this was checked.I personally performed the services described in this documentation, which was scribed in my presence. The recorded information has been reviewed and is accurate.Marland Kitchen

## 2014-11-07 LAB — COMPLETE METABOLIC PANEL WITH GFR
AST: 7 U/L (ref 0–37)
Albumin: 3.7 g/dL (ref 3.5–5.2)
Alkaline Phosphatase: 62 U/L (ref 39–117)
BILIRUBIN TOTAL: 0.2 mg/dL (ref 0.2–1.2)
BUN: 18 mg/dL (ref 6–23)
CHLORIDE: 102 meq/L (ref 96–112)
CO2: 23 mEq/L (ref 19–32)
CREATININE: 1.02 mg/dL (ref 0.50–1.10)
Calcium: 9.6 mg/dL (ref 8.4–10.5)
GFR, Est African American: 80 mL/min
GFR, Est Non African American: 69 mL/min
GLUCOSE: 92 mg/dL (ref 70–99)
Potassium: 4.5 mEq/L (ref 3.5–5.3)
Sodium: 137 mEq/L (ref 135–145)
TOTAL PROTEIN: 7.3 g/dL (ref 6.0–8.3)

## 2014-11-07 LAB — IRON AND TIBC
%SAT: 5 % — ABNORMAL LOW (ref 20–55)
Iron: 23 ug/dL — ABNORMAL LOW (ref 42–145)
TIBC: 423 ug/dL (ref 250–470)
UIBC: 400 ug/dL (ref 125–400)

## 2014-11-07 LAB — MICROALBUMIN, URINE: MICROALB UR: 99.7 mg/dL — AB (ref <2.0)

## 2014-11-07 LAB — FERRITIN: Ferritin: 16 ng/mL (ref 10–291)

## 2014-11-07 LAB — TSH: TSH: 4.038 u[IU]/mL (ref 0.350–4.500)

## 2014-11-08 DIAGNOSIS — R809 Proteinuria, unspecified: Secondary | ICD-10-CM | POA: Insufficient documentation

## 2014-11-08 DIAGNOSIS — D573 Sickle-cell trait: Secondary | ICD-10-CM | POA: Insufficient documentation

## 2014-11-08 LAB — SICKLE CELL SCREEN: SICKLE CELL SCREEN: POSITIVE — AB

## 2014-11-08 NOTE — Addendum Note (Signed)
Addended by: Johnnette LitterARDWELL, Peterson Mathey M on: 11/08/2014 10:51 AM   Modules accepted: Kipp BroodSmartSet

## 2014-11-10 LAB — HEMOGLOBINOPATHY EVALUATION
HGB A2 QUANT: 3 % (ref 2.2–3.2)
HGB A: 61.6 % — AB (ref 96.8–97.8)
Hemoglobin Other: 0 %
Hgb F Quant: 0 % (ref 0.0–2.0)
Hgb S Quant: 35.4 % — ABNORMAL HIGH

## 2014-11-10 LAB — GLUCOSE 6 PHOSPHATE DEHYDROGENASE: G-6PDH: 17.1 U/g{Hb} (ref 7.0–20.5)

## 2014-11-12 ENCOUNTER — Other Ambulatory Visit: Payer: Self-pay | Admitting: Radiology

## 2014-11-23 ENCOUNTER — Other Ambulatory Visit: Payer: Self-pay | Admitting: Family Medicine

## 2014-11-23 ENCOUNTER — Telehealth: Payer: Self-pay | Admitting: Hematology & Oncology

## 2014-11-23 NOTE — Telephone Encounter (Signed)
Left vm  today to remind them of their appointment with Dr. Myna HidalgoEnnever. Also, advised them to bring all medication bottles and insurance card information.

## 2014-11-24 ENCOUNTER — Ambulatory Visit (HOSPITAL_BASED_OUTPATIENT_CLINIC_OR_DEPARTMENT_OTHER): Payer: BLUE CROSS/BLUE SHIELD

## 2014-11-24 ENCOUNTER — Encounter: Payer: Self-pay | Admitting: Family

## 2014-11-24 ENCOUNTER — Other Ambulatory Visit: Payer: Self-pay | Admitting: Family

## 2014-11-24 ENCOUNTER — Other Ambulatory Visit (HOSPITAL_BASED_OUTPATIENT_CLINIC_OR_DEPARTMENT_OTHER): Payer: BLUE CROSS/BLUE SHIELD | Admitting: Lab

## 2014-11-24 ENCOUNTER — Ambulatory Visit: Payer: BLUE CROSS/BLUE SHIELD

## 2014-11-24 ENCOUNTER — Ambulatory Visit (HOSPITAL_BASED_OUTPATIENT_CLINIC_OR_DEPARTMENT_OTHER): Payer: BLUE CROSS/BLUE SHIELD | Admitting: Family

## 2014-11-24 DIAGNOSIS — I509 Heart failure, unspecified: Secondary | ICD-10-CM

## 2014-11-24 DIAGNOSIS — D509 Iron deficiency anemia, unspecified: Secondary | ICD-10-CM

## 2014-11-24 DIAGNOSIS — E1165 Type 2 diabetes mellitus with hyperglycemia: Secondary | ICD-10-CM

## 2014-11-24 DIAGNOSIS — D573 Sickle-cell trait: Secondary | ICD-10-CM

## 2014-11-24 DIAGNOSIS — E119 Type 2 diabetes mellitus without complications: Secondary | ICD-10-CM

## 2014-11-24 LAB — CBC WITH DIFFERENTIAL (CANCER CENTER ONLY)
BASO#: 0 10*3/uL (ref 0.0–0.2)
BASO%: 0.2 % (ref 0.0–2.0)
EOS%: 2.8 % (ref 0.0–7.0)
Eosinophils Absolute: 0.3 10*3/uL (ref 0.0–0.5)
HCT: 30.5 % — ABNORMAL LOW (ref 34.8–46.6)
HGB: 9.6 g/dL — ABNORMAL LOW (ref 11.6–15.9)
LYMPH#: 4.1 10*3/uL — ABNORMAL HIGH (ref 0.9–3.3)
LYMPH%: 37.3 % (ref 14.0–48.0)
MCH: 20.5 pg — ABNORMAL LOW (ref 26.0–34.0)
MCHC: 31.5 g/dL — ABNORMAL LOW (ref 32.0–36.0)
MCV: 65 fL — ABNORMAL LOW (ref 81–101)
MONO#: 0.7 10*3/uL (ref 0.1–0.9)
MONO%: 6.2 % (ref 0.0–13.0)
NEUT%: 53.5 % (ref 39.6–80.0)
NEUTROS ABS: 5.9 10*3/uL (ref 1.5–6.5)
RBC: 4.68 10*6/uL (ref 3.70–5.32)
RDW: 19.7 % — ABNORMAL HIGH (ref 11.1–15.7)
WBC: 11 10*3/uL — AB (ref 3.9–10.0)

## 2014-11-24 LAB — IRON AND TIBC CHCC
%SAT: 5 % — ABNORMAL LOW (ref 21–57)
Iron: 20 ug/dL — ABNORMAL LOW (ref 41–142)
TIBC: 391 ug/dL (ref 236–444)
UIBC: 371 ug/dL (ref 120–384)

## 2014-11-24 LAB — CHCC SATELLITE - SMEAR

## 2014-11-24 LAB — FERRITIN CHCC: Ferritin: 14 ng/ml (ref 9–269)

## 2014-11-24 MED ORDER — SODIUM CHLORIDE 0.9 % IV SOLN
510.0000 mg | Freq: Once | INTRAVENOUS | Status: AC
  Administered 2014-11-24: 510 mg via INTRAVENOUS
  Filled 2014-11-24: qty 17

## 2014-11-24 MED ORDER — FOLIC ACID 1 MG PO TABS: 1.0000 mg | ORAL_TABLET | Freq: Every day | ORAL | Status: DC

## 2014-11-24 MED ORDER — SODIUM CHLORIDE 0.9 % IV SOLN
INTRAVENOUS | Status: DC
  Administered 2014-11-24: 11:00:00 via INTRAVENOUS

## 2014-11-24 NOTE — Progress Notes (Signed)
Hematology/Oncology Consultation   Name: Brittany Lopez      MRN: [id number]    Location: Room/bed info not found  Date: 11/24/2014 Time:11:49 AM   REFERRING PHYSICIAN: Remo Lipps A. Daub MD  REASON FOR CONSULT: Iron deficiency anemia   DIAGNOSIS: 1. Iron deficiency anemia  2. Sickle cell trait  HISTORY OF PRESENT ILLNESS: [name] is a very pleasant 39 yo [place] American female with history of iron deficiency anemia and sickle cell trait. She is feeling tired, stays cold and chews a good but of ice.  She has quite a few health issues for being so young. She has CHF and diabetes type 2.  She has started exercising 4-5 days a week and gone on a strict diet. She has lost 30 lbs so far. She really want to get off the diabetes medications so she is working hard.  As mentioned above, she has the sickle cell train. Her sister also has this and is anemic. She is not on folic acid.  Her cycles are regular and not heavy.  She has one daughter. She developed gestational diabetes with her during her 52th month of pregnancy and ended up having a C-section.  No personal or familial history of cancer.     She does not smoke or drink alcohol.  She is currently in school for health care administration.  She denies fever, n/v, cough, rash, dizziness, blurred vision, SOB, chest pain, palpitations, abdominal pain, constipation, diarrhea, blood in urine or stool. No episodes of bleeding.  No tenderness, numbness or tingling in her extremities. She has some chronic swelling in her legs with the CHF and is on Lasix.  Her appetite is good and she is staying hydrated.  In February, her iron saturation was 5% and ferritin was 16.   ROS: All other 10 point review of systems is negative.   PAST MEDICAL HISTORY:   Past Medical History  Diagnosis Date  . Diabetes mellitus without complication   . Hypertension   . Obesity     ALLERGIES: No Known Allergies    MEDICATIONS:  Current Outpatient Prescriptions  on File Prior to Visit  Medication Sig Dispense Refill  . amLODipine (NORVASC) 10 MG tablet Take 1 tablet (10 mg total) by mouth daily. 30 tablet 5  . carvedilol (COREG) 25 MG tablet Take 1 tablet (25 mg total) by mouth 2 (two) times daily with a meal. 60 tablet 5  . ferrous sulfate 325 (65 FE) MG tablet Take 1 tablet (325 mg total) by mouth 3 (three) times daily with meals. 100 tablet 5  . furosemide (LASIX) 40 MG tablet Take 1 tablet (40 mg total) by mouth daily. 30 tablet 5  . glucose monitoring kit (FREESTYLE) monitoring kit 1 each by Does not apply route as needed for other. 1 each 0  . insulin aspart protamine- aspart (NOVOLOG MIX 70/30) (70-30) 100 UNIT/ML injection Inject 0.15 mLs (15 Units total) into the skin 2 (two) times daily with a meal. May use Relion brand from [place]. 20 mL 3  . Insulin Syringe-Needle U-100 (SAFETY-GLIDE 0.3CC SYR 29GX1/2) 29G X 1/2" 0.3 ML MISC Use as directed. 200 each 0  . metFORMIN (GLUCOPHAGE) 1000 MG tablet Take 1 tablet (1,000 mg total) by mouth 2 (two) times daily with a meal. 60 tablet 3   No current facility-administered medications on file prior to visit.     PAST SURGICAL HISTORY Past Surgical History  Procedure Laterality Date  . Cesarean section  FAMILY HISTORY: Family History  Problem Relation Age of Onset  . Diabetes Mother   . Heart disease Mother   . Hypertension Mother   . Stroke Mother   . Sjogren's syndrome Mother     SOCIAL HISTORY:  reports that she has never smoked. She does not have any smokeless tobacco history on file. She reports that she does not drink alcohol or use illicit drugs.  PERFORMANCE STATUS: The patient's performance status is 1 - Symptomatic but completely ambulatory  PHYSICAL EXAM: Most Recent Vital Signs: Blood pressure 165/92, pulse 83, temperature 98.1 F (36.7 C), temperature source Oral, resp. rate 18, height _0  (1.626 m), weight 273 lb (123.832 kg), last menstrual period 10/31/2014. BP  165/92 mmHg  Pulse 83  Temp(Src) 98.1 F (36.7 C) (Oral)  Resp 18  Ht _1  (1.626 m)  Wt 273 lb (123.832 kg)  BMI 46.84 kg/m2  LMP 10/31/2014  General Appearance:    Alert, cooperative, no distress, appears stated age  Head:    Normocephalic, without obvious abnormality, atraumatic  Eyes:    PERRL, conjunctiva/corneas clear, EOM's intact, fundi    benign, both eyes        Throat:   Lips, mucosa, and tongue normal; teeth and gums normal  Neck:   Supple, symmetrical, trachea midline, no adenopathy;    thyroid:  no enlargement/tenderness/nodules; no carotid   bruit or JVD  Back:     Symmetric, no curvature, ROM normal, no CVA tenderness  Lungs:     Clear to auscultation bilaterally, respirations unlabored  Chest Wall:    No tenderness or deformity   Heart:    Regular rate and rhythm, S1 and S2 normal, no murmur, rub   or gallop     Abdomen:     Soft, non-tender, bowel sounds active all four quadrants,    no masses, no organomegaly        Extremities:   Extremities normal, atraumatic, no cyanosis or edema  Pulses:   2+ and symmetric all extremities  Skin:   Skin color, texture, turgor normal, no rashes or lesions  Lymph nodes:   Cervical, supraclavicular, and axillary nodes normal  Neurologic:   CNII-XII intact, normal strength, sensation and reflexes    throughout   LABORATORY DATA:  Results for orders placed or performed in visit on 11/24/14 (from the past 48 hour(s))  CBC with Differential [facility])     Status: Abnormal   Collection Time: 11/24/14  9:32 AM  Result Value Ref Range   WBC 11.0 (H) 3.9 - 10.0 10e3/uL   RBC 4.68 3.70 - 5.32 10e6/uL   HGB 9.6 (L) 11.6 - 15.9 g/dL   HCT 30.5 (L) 34.8 - 46.6 %   MCV 65 (L) 81 - 101 fL   MCH 20.5 (L) 26.0 - 34.0 pg   MCHC 31.5 (L) 32.0 - 36.0 g/dL   RDW 19.7 (H) 11.1 - 15.7 %   Platelets 214 Large platelets present 145 - 400 10e3/uL   NEUT# 5.9 1.5 - 6.5 10e3/uL   LYMPH# 4.1 (H) 0.9 - 3.3 10e3/uL   MONO# 0.7 0.1 -  0.9 10e3/uL   Eosinophils Absolute 0.3 0.0 - 0.5 10e3/uL   BASO# 0.0 0.0 - 0.2 10e3/uL   NEUT% 53.5 39.6 - 80.0 %   LYMPH% 37.3 14.0 - 48.0 %   MONO% 6.2 0.0 - 13.0 %   EOS% 2.8 0.0 - 7.0 %   BASO% 0.2 0.0 - 2.0 %  [facility] -  Smear     Status: None   Collection Time: 11/24/14  9:32 AM  Result Value Ref Range   Smear Result Smear Available       RADIOGRAPHY: No results found.     PATHOLOGY: None  ASSESSMENT/PLAN: [name] is a very pleasant 39 yo African American female with history of iron deficiency anemia and sickle cell trait. She is also diabetic and has CHF.  Her most recent iron saturation was 5% and ferritin was 16. She is symptomatic with fatigue and chills. She also chews a lot of ice.  Her Hgb today is 9.6 and MCV 65. We will see what her iron studies show. We also checked an erythropoietin level and alpha thalassemia on her. Dr. Marin Olp reviewed her blood smear.  We will give her a dose of Fereheme today and again in 8 days.  We will have her start taking folic acid 1 mg daily.  We will see her back in 6 weeks for labs and follow-up.  All questions were answered. She knows to call here with any questions or concerns and to go to the ED in the event of an emergency. We can certainly see her sooner if need be.  The patient was discussed with Dr. Marin Olp and he is in agreement with the aforementioned.   [facility] M

## 2014-11-24 NOTE — Patient Instructions (Signed)

## 2014-11-26 LAB — ERYTHROPOIETIN: ERYTHROPOIETIN: 64.5 m[IU]/mL — AB (ref 2.6–18.5)

## 2014-12-01 ENCOUNTER — Encounter: Payer: Self-pay | Admitting: Cardiology

## 2014-12-01 ENCOUNTER — Ambulatory Visit (INDEPENDENT_AMBULATORY_CARE_PROVIDER_SITE_OTHER): Payer: BLUE CROSS/BLUE SHIELD | Admitting: Cardiology

## 2014-12-01 VITALS — BP 180/96 | HR 85 | Ht 64.0 in | Wt 267.8 lb

## 2014-12-01 DIAGNOSIS — I5031 Acute diastolic (congestive) heart failure: Secondary | ICD-10-CM

## 2014-12-01 DIAGNOSIS — I10 Essential (primary) hypertension: Secondary | ICD-10-CM

## 2014-12-01 DIAGNOSIS — I5032 Chronic diastolic (congestive) heart failure: Secondary | ICD-10-CM | POA: Diagnosis not present

## 2014-12-01 MED ORDER — LOSARTAN POTASSIUM 25 MG PO TABS: 25.0000 mg | ORAL_TABLET | Freq: Every day | ORAL | Status: DC

## 2014-12-01 NOTE — Patient Instructions (Signed)
Your physician has recommended you make the following change in your medication:   START TAKING LOSARTAN 25 MG ONCE DAILY    Your physician recommends that you return for lab work in: COME ON 12/21/14 AT OUR OFFICE, SAME DAY AS YOUR OFFICE VISIT WITH DR Delton SeeNELSON TO CHECK A BMET    Your physician recommends that you schedule a follow-up appointment in: 12/21/14 TO SEE DR NELSON AT 1130 AM AS SCHEDULED.

## 2014-12-01 NOTE — Progress Notes (Signed)
Patient ID: Brittany Lopez, female   DOB: August 05, 1976, 39 y.o.   MRN: [id number]      Patient Name: Brittany Lopez Date of Encounter: 12/01/2014  Primary Care Provider:  Leandrew Koyanagi, MD Primary Cardiologist:  Dorothy Spark   Problem List   Past Medical History  Diagnosis Date  . Diabetes mellitus without complication   . Hypertension   . Obesity    Past Surgical History  Procedure Laterality Date  . Cesarean section      Allergies  No Known Allergies  HPI  39 year old [place] American female with past medical history significant for hypertension, diabetes and morbid obesity admitted on 06/04/14 with diarrhea x 3 days and malignant hypertension. While in the ED, she began to have dyspnea and required nonrebreather. She was treated for acute on chronic diastolic HF in the setting of uncontrolled HTN and 2 L IV fluid resuscitation with diarrhea. Continued Lasix 40 mg po BID on discharge, Crea improving 1 -->1.2--> 1.4 --> 1.3, need aggressive BP control, start physical therapy.  06/17/14 - 1.post hospitalization visit, she is feeling whole lot better, no SOB, no orthopnea, PND, no chest pain. She is complaint with her meds. She has mild residual LE edema.   08/23/2014 -  The patient is coming after 2 months. She feels significantly better and feels that she can perform much more  Exercise , she goes to gym twice a week and is able to exercise on treadmill for half an hour. In between she is doing Zumba at home.  She is very compliant with her medication. She doesn't feel like she has any side effects from them. Recently her insulin dose was decreased as she was getting hypoglycemic episodes and confusion. She denies any shortness of breath at rest no orthopnea or proximal nocturnal dyspnea. She also denies any palpitations or syncope. She is compliant with her medicines and started to eat Mediterranean style diet.  12/01/2014 - patient is coming after 3 months, in the  meantime she felt very well she denies any chest pain or shortness of breath. She goes to the gym on a regular basis and lost 18 pounds since the last visit. She denies any orthopnea, paroxysmal nocturnal dyspnea, lower extremity edema. She carries a sickle cell trait and her most recent iron studies showed a very low iron and ferritin levels and she was initiated on IV iron supplements. This is being followed by her primary care physician. She has noticed that since her anemia worsen her blood pressure has been elevated. She denies any headaches or syncope.   Home Medications  Prior to Admission medications   Medication Sig Start Date End Date Taking? Authorizing Provider  amLODipine (NORVASC) 10 MG tablet Take 1 tablet (10 mg total) by mouth daily. 06/04/14  Yes Modena Jansky, MD  carvedilol (COREG) 25 MG tablet Take 1 tablet (25 mg total) by mouth 2 (two) times daily with a meal. 06/04/14  Yes Modena Jansky, MD  furosemide (LASIX) 40 MG tablet Take 40 mg by mouth daily. 06/04/14  Yes Modena Jansky, MD  insulin aspart protamine- aspart (NOVOLOG MIX 70/30) (70-30) 100 UNIT/ML injection Inject 15 Units into the skin 2 (two) times daily with a meal. May use Relion brand from [place]. 06/04/14  Yes Modena Jansky, MD  metFORMIN (GLUCOPHAGE) 1000 MG tablet Take 1 tablet (1,000 mg total) by mouth 2 (two) times daily with a meal. 03/29/14  Yes Elby Beck, FNP  glucose  monitoring kit (FREESTYLE) monitoring kit 1 each by Does not apply route as needed for other. 06/04/14   Modena Jansky, MD  Insulin Syringe-Needle U-100 (SAFETY-GLIDE 0.3CC SYR 29GX1/2) 29G X 1/2" 0.3 ML MISC Use as directed. 06/04/14   Modena Jansky, MD    Family History  Family History  Problem Relation Age of Onset  . Diabetes Mother   . Heart disease Mother   . Hypertension Mother   . Stroke Mother   . Sjogren's syndrome Mother     Social History  History   Social History  . Marital Status: Married     Spouse Name: N/A  . Number of Children: N/A  . Years of Education: N/A   Occupational History  . Not on file.   Social History Main Topics  . Smoking status: Never Smoker   . Smokeless tobacco: Not on file  . Alcohol Use: No  . Drug Use: No  . Sexual Activity: Not on file   Other Topics Concern  . Not on file   Social History Narrative     Review of Systems, as per HPI, otherwise negative General:  No chills, fever, night sweats or weight changes.  Cardiovascular:  No chest pain, dyspnea on exertion, edema, orthopnea, palpitations, paroxysmal nocturnal dyspnea. Dermatological: No rash, lesions/masses Respiratory: No cough, dyspnea Urologic: No hematuria, dysuria Abdominal:   No nausea, vomiting, diarrhea, bright red blood per rectum, melena, or hematemesis Neurologic:  No visual changes, wkns, changes in mental status. All other systems reviewed and are otherwise negative except as noted above.  Physical Exam  Last menstrual period 10/31/2014.  General: Pleasant, NAD Psych: Normal affect. Neuro: Alert and oriented X 3. Moves all extremities spontaneously. HEENT: Normal  Neck: Supple without bruits or JVD. Lungs:  Resp regular and unlabored, CTA. Heart: RRR no s3, s4, or murmurs. Abdomen: Soft, non-tender, non-distended, BS + x 4.  Extremities: No clubbing, cyanosis or edema. DP/PT/Radials 2+ and equal bilaterally.  Labs:  No results for input(s): CKTOTAL, CKMB, TROPONINI in the last 72 hours. Lab Results  Component Value Date   WBC 11.0* 11/24/2014   HGB 9.6* 11/24/2014   HCT 30.5* 11/24/2014   MCV 65* 11/24/2014   PLT 214 Large platelets present 11/24/2014    Lab Results  Component Value Date   DDIMER 1.27* 05/30/2014   Invalid input(s): POCBNP    Component Value Date/Time   NA 137 11/06/2014 1636   K 4.5 11/06/2014 1636   CL 102 11/06/2014 1636   CO2 23 11/06/2014 1636   GLUCOSE 92 11/06/2014 1636   BUN 18 11/06/2014 1636   CREATININE 1.02  11/06/2014 1636   CREATININE 0.9 08/23/2014 1300   CALCIUM 9.6 11/06/2014 1636   PROT 7.3 11/06/2014 1636   ALBUMIN 3.7 11/06/2014 1636   AST 7 11/06/2014 1636   ALT <8 11/06/2014 1636   ALKPHOS 62 11/06/2014 1636   BILITOT 0.2 11/06/2014 1636   GFRNONAA 69 11/06/2014 1636   GFRNONAA 58* 06/04/2014 0542   GFRAA 80 11/06/2014 1636   GFRAA 68* 06/04/2014 0542   Lab Results  Component Value Date   CHOL  05/24/2010    123        ATP III CLASSIFICATION:  <200     mg/dL   Desirable  200-239  mg/dL   Borderline High  >=240    mg/dL   High          HDL 32* 05/24/2010   [place]  05/24/2010  73        Total Cholesterol/HDL:CHD Risk Coronary Heart Disease Risk Table                     Men   Women  1/2 Average Risk   3.4   3.3  Average Risk       5.0   4.4  2 X Average Risk   9.6   7.1  3 X Average Risk  23.4   11.0        Use the calculated Patient Ratio above and the CHD Risk Table to determine the patient's CHD Risk.        ATP III CLASSIFICATION (LDL):  <100     mg/dL   Optimal  100-129  mg/dL   Near or Above                    Optimal  130-159  mg/dL   Borderline  160-189  mg/dL   High  >190     mg/dL   Very High   TRIG 91 05/24/2010    Accessory Clinical Findings  echocardiogram   ECHO 05/31/2014 Left ventricle: The cavity size was normal. Wall thickness was increased in a pattern of mild LVH. Systolic function was normal. The estimated ejection fraction was in the range of 50% to 55%. Cannot exclude hypokinesis of the basalinferior myocardium. Doppler parameters are consistent with abnormal left ventricular relaxation (grade 1 diastolic dysfunction). - Aortic valve: Probably trileaflet. Appears to be an area of moderate calcification involving the noncoronary or left coronary cusp and adjacent annulus. Not well seen, cannot completely exclude a calcified vegetation or mass. This could be further visualized by TEE, if felt to be clinically appropriate.  Cusp separation was normal. There was no significant regurgitation. - Left atrium: The atrium was at the upper limits of normal in size. - Right atrium: Central venous pressure (est): 3 mm Hg. - Tricuspid valve: There was trivial regurgitation. - Pulmonary arteries: Systolic pressure could not be accurately estimated. - Pericardium, extracardiac: There was no pericardial effusion.  Impressions:  - Limited images overall. Mild LVH with LVEF approximately 50-55%, cannot exclude inferior hypokinesis, grade 1 diastolic dysfunction. Appears to be an area of moderate calcification involving the noncoronary or left coronary cusp and adjacent annulus. Not well seen, cannot completely exclude a calcified vegetation or mass. This could be further visualized by TEE, if felt to be clinically appropriate. Trivial tricuspid regurgitation, unable to assess PASP.    ASSESSMENT AND PLAN  39 year old [place] American female with past medical history significant for hypertension, diabetes and morbid obesity hospitalized on 06/04/14 for acute on chronic CHF secondary to malignant hypertension.   1. Acute on chronic diastolic HF  - in the setting of uncontrolled HTN and 2 L IV fluid resuscitation with diarrhea - now euvolemic, Crea improved, continue Lasix 40 mg PO daily.  check BMP today, also CBC as microcytic anemia in October.  - Crea improving 1 -->1.2--> 1.4 --> 1.3--> 1.18 -->1.02  - need aggressive BP control  2. Uncontrolled HTN:  - add losartan 25 mg po daily  3. ?AV calcification  - Reviewed the echo image, does not believe she need TEE at this time. A-febrile, low suspicion for endocarditis. Calcified lesion make endocarditis unlikely too   4. Diabetes - improve control, HbA1c 10.3%-->8.1%, continues to loose weight  5. Morbid obesity - great weight loss, encourage to continue exercising  6. SS train - severe microcytic  anemia - on iv iron, followed by PCP  Follow up in 1 months,  BMP prior to the visit.  Signed, Dorothy Spark MD, [facility] 06/16/2014

## 2014-12-02 ENCOUNTER — Ambulatory Visit (HOSPITAL_BASED_OUTPATIENT_CLINIC_OR_DEPARTMENT_OTHER): Payer: BLUE CROSS/BLUE SHIELD

## 2014-12-02 DIAGNOSIS — D509 Iron deficiency anemia, unspecified: Secondary | ICD-10-CM | POA: Diagnosis not present

## 2014-12-02 LAB — HEMOGLOBINOPATHY EVALUATION
HEMOGLOBIN OTHER: 0 %
HGB A2 QUANT: 3 % (ref 2.2–3.2)
HGB A: 62.1 % — AB (ref 96.8–97.8)
HGB F QUANT: 0 % (ref 0.0–2.0)
Hgb S Quant: 34.9 % — ABNORMAL HIGH

## 2014-12-02 LAB — ALPHA-THALASSEMIA GENOTYPR

## 2014-12-02 MED ORDER — SODIUM CHLORIDE 0.9 % IV SOLN
Freq: Once | INTRAVENOUS | Status: AC
  Administered 2014-12-02: 12:00:00 via INTRAVENOUS

## 2014-12-02 MED ORDER — SODIUM CHLORIDE 0.9 % IV SOLN
510.0000 mg | Freq: Once | INTRAVENOUS | Status: AC
  Administered 2014-12-02: 510 mg via INTRAVENOUS
  Filled 2014-12-02: qty 17

## 2014-12-02 NOTE — Patient Instructions (Signed)

## 2014-12-21 ENCOUNTER — Other Ambulatory Visit (INDEPENDENT_AMBULATORY_CARE_PROVIDER_SITE_OTHER): Payer: BLUE CROSS/BLUE SHIELD

## 2014-12-21 ENCOUNTER — Encounter: Payer: Self-pay | Admitting: Cardiology

## 2014-12-21 ENCOUNTER — Ambulatory Visit (INDEPENDENT_AMBULATORY_CARE_PROVIDER_SITE_OTHER): Payer: BLUE CROSS/BLUE SHIELD | Admitting: Cardiology

## 2014-12-21 VITALS — BP 152/92 | HR 68 | Ht 64.0 in | Wt 261.0 lb

## 2014-12-21 DIAGNOSIS — I10 Essential (primary) hypertension: Secondary | ICD-10-CM

## 2014-12-21 DIAGNOSIS — I5031 Acute diastolic (congestive) heart failure: Secondary | ICD-10-CM

## 2014-12-21 DIAGNOSIS — I5032 Chronic diastolic (congestive) heart failure: Secondary | ICD-10-CM | POA: Diagnosis not present

## 2014-12-21 LAB — BASIC METABOLIC PANEL
BUN: 16 mg/dL (ref 6–23)
CO2: 27 mEq/L (ref 19–32)
Calcium: 9.3 mg/dL (ref 8.4–10.5)
Chloride: 104 mEq/L (ref 96–112)
Creatinine, Ser: 1.11 mg/dL (ref 0.40–1.20)
GFR: 70.19 mL/min (ref >60.00)
Glucose, Bld: 88 mg/dL (ref 70–99)
Potassium: 3.8 mEq/L (ref 3.5–5.1)
Sodium: 137 mEq/L (ref 135–145)

## 2014-12-21 NOTE — Progress Notes (Signed)
Patient ID: Brittany Lopez, female   DOB: Apr 07, 1976, 39 y.o.   MRN: [id number]    Patient Name: Brittany Lopez Date of Encounter: 12/21/2014  Primary Care Provider:  Leandrew Koyanagi, MD Primary Cardiologist:  Dorothy Spark  Problem List   Past Medical History  Diagnosis Date  . Diabetes mellitus without complication   . Hypertension   . Obesity    Past Surgical History  Procedure Laterality Date  . Cesarean section     Allergies  No Known Allergies  HPI  39 year old [place] American female with past medical history significant for hypertension, diabetes and morbid obesity admitted on 06/04/14 with diarrhea x 3 days and malignant hypertension. While in the ED, she began to have dyspnea and required nonrebreather. She was treated for acute on chronic diastolic HF in the setting of uncontrolled HTN and 2 L IV fluid resuscitation with diarrhea. Continued Lasix 40 mg po BID on discharge, Crea improving 1 -->1.2--> 1.4 --> 1.3, need aggressive BP control, start physical therapy.  06/17/14 - 1.post hospitalization visit, she is feeling whole lot better, no SOB, no orthopnea, PND, no chest pain. She is complaint with her meds. She has mild residual LE edema.   08/23/2014 -  The patient is coming after 2 months. She feels significantly better and feels that she can perform much more  Exercise , she goes to gym twice a week and is able to exercise on treadmill for half an hour. In between she is doing Zumba at home.  She is very compliant with her medication. She doesn't feel like she has any side effects from them. Recently her insulin dose was decreased as she was getting hypoglycemic episodes and confusion. She denies any shortness of breath at rest no orthopnea or proximal nocturnal dyspnea. She also denies any palpitations or syncope. She is compliant with her medicines and started to eat Mediterranean style diet.  12/01/2014 - patient is coming after 3 months, in the meantime  she felt very well she denies any chest pain or shortness of breath. She goes to the gym on a regular basis and lost 18 pounds since the last visit. She denies any orthopnea, paroxysmal nocturnal dyspnea, lower extremity edema. She carries a sickle cell trait and her most recent iron studies showed a very low iron and ferritin levels and she was initiated on IV iron supplements. This is being followed by her primary care physician. She has noticed that since her anemia worsen her blood pressure has been elevated. She denies any headaches or syncope.  12/21/2014 - patient is coming after 1 months, she feels better, she continues going to gym every other day and denies any significant dyspnea and no chest pain. She was started on losartan at the last visit for hypertension, she states that her blood pressure fluctuates as lows as 100-150 mmHg. She denies any dizziness or syncope with that. She has been followed by hematologist as she has sickle cell trait and was 3 mL living IV iron. The patient identified mass in her rest and also in her vagina and is going to follow with gynecology for further workup.   Home Medications  Prior to Admission medications   Medication Sig Start Date End Date Taking? Authorizing Provider  amLODipine (NORVASC) 10 MG tablet Take 1 tablet (10 mg total) by mouth daily. 06/04/14  Yes Modena Jansky, MD  carvedilol (COREG) 25 MG tablet Take 1 tablet (25 mg total) by mouth 2 (two) times daily  with a meal. 06/04/14  Yes Modena Jansky, MD  furosemide (LASIX) 40 MG tablet Take 40 mg by mouth daily. 06/04/14  Yes Modena Jansky, MD  insulin aspart protamine- aspart (NOVOLOG MIX 70/30) (70-30) 100 UNIT/ML injection Inject 15 Units into the skin 2 (two) times daily with a meal. May use Relion brand from [place]. 06/04/14  Yes Modena Jansky, MD  metFORMIN (GLUCOPHAGE) 1000 MG tablet Take 1 tablet (1,000 mg total) by mouth 2 (two) times daily with a meal. 03/29/14  Yes Elby Beck, FNP  glucose monitoring kit (FREESTYLE) monitoring kit 1 each by Does not apply route as needed for other. 06/04/14   Modena Jansky, MD  Insulin Syringe-Needle U-100 (SAFETY-GLIDE 0.3CC SYR 29GX1/2) 29G X 1/2" 0.3 ML MISC Use as directed. 06/04/14   Modena Jansky, MD    Family History  Family History  Problem Relation Age of Onset  . Diabetes Mother   . Heart disease Mother   . Hypertension Mother   . Stroke Mother   . Sjogren's syndrome Mother     Social History  History   Social History  . Marital Status: Married    Spouse Name: N/A  . Number of Children: N/A  . Years of Education: N/A   Occupational History  . Not on file.   Social History Main Topics  . Smoking status: Never Smoker   . Smokeless tobacco: Not on file  . Alcohol Use: No  . Drug Use: No  . Sexual Activity: Not on file   Other Topics Concern  . Not on file   Social History Narrative     Review of Systems, as per HPI, otherwise negative General:  No chills, fever, night sweats or weight changes.  Cardiovascular:  No chest pain, dyspnea on exertion, edema, orthopnea, palpitations, paroxysmal nocturnal dyspnea. Dermatological: No rash, lesions/masses Respiratory: No cough, dyspnea Urologic: No hematuria, dysuria Abdominal:   No nausea, vomiting, diarrhea, bright red blood per rectum, melena, or hematemesis Neurologic:  No visual changes, wkns, changes in mental status. All other systems reviewed and are otherwise negative except as noted above.  Physical Exam  Blood pressure 152/92, pulse 68, height _0  (1.626 m), weight 261 lb (118.389 kg).  General: Pleasant, NAD Psych: Normal affect. Neuro: Alert and oriented X 3. Moves all extremities spontaneously. HEENT: Normal  Neck: Supple without bruits or JVD. Lungs:  Resp regular and unlabored, CTA. Heart: RRR no s3, s4, or murmurs. Abdomen: Soft, non-tender, non-distended, BS + x 4.  Extremities: No clubbing, cyanosis or edema.  DP/PT/Radials 2+ and equal bilaterally.  Labs:  No results for input(s): CKTOTAL, CKMB, TROPONINI in the last 72 hours. Lab Results  Component Value Date   WBC 11.0* 11/24/2014   HGB 9.6* 11/24/2014   HCT 30.5* 11/24/2014   MCV 65* 11/24/2014   PLT 214 Large platelets present 11/24/2014    Lab Results  Component Value Date   DDIMER 1.27* 05/30/2014   Invalid input(s): POCBNP    Component Value Date/Time   NA 137 11/06/2014 1636   K 4.5 11/06/2014 1636   CL 102 11/06/2014 1636   CO2 23 11/06/2014 1636   GLUCOSE 92 11/06/2014 1636   BUN 18 11/06/2014 1636   CREATININE 1.02 11/06/2014 1636   CREATININE 0.9 08/23/2014 1300   CALCIUM 9.6 11/06/2014 1636   PROT 7.3 11/06/2014 1636   ALBUMIN 3.7 11/06/2014 1636   AST 7 11/06/2014 1636   ALT <8 11/06/2014 1636  ALKPHOS 62 11/06/2014 1636   BILITOT 0.2 11/06/2014 1636   GFRNONAA 69 11/06/2014 1636   GFRNONAA 58* 06/04/2014 0542   GFRAA 80 11/06/2014 1636   GFRAA 68* 06/04/2014 0542   Lab Results  Component Value Date   CHOL  05/24/2010    123        ATP III CLASSIFICATION:  <200     mg/dL   Desirable  200-239  mg/dL   Borderline High  >=240    mg/dL   High          HDL 32* 05/24/2010   [place]  05/24/2010    73        Total Cholesterol/HDL:CHD Risk Coronary Heart Disease Risk Table                     Men   Women  1/2 Average Risk   3.4   3.3  Average Risk       5.0   4.4  2 X Average Risk   9.6   7.1  3 X Average Risk  23.4   11.0        Use the calculated Patient Ratio above and the CHD Risk Table to determine the patient's CHD Risk.        ATP III CLASSIFICATION (LDL):  <100     mg/dL   Optimal  100-129  mg/dL   Near or Above                    Optimal  130-159  mg/dL   Borderline  160-189  mg/dL   High  >190     mg/dL   Very High   TRIG 91 05/24/2010    Accessory Clinical Findings  echocardiogram   ECHO 05/31/2014 Left ventricle: The cavity size was normal. Wall thickness was increased in a  pattern of mild LVH. Systolic function was normal. The estimated ejection fraction was in the range of 50% to 55%. Cannot exclude hypokinesis of the basalinferior myocardium. Doppler parameters are consistent with abnormal left ventricular relaxation (grade 1 diastolic dysfunction). - Aortic valve: Probably trileaflet. Appears to be an area of moderate calcification involving the noncoronary or left coronary cusp and adjacent annulus. Not well seen, cannot completely exclude a calcified vegetation or mass. This could be further visualized by TEE, if felt to be clinically appropriate. Cusp separation was normal. There was no significant regurgitation. - Left atrium: The atrium was at the upper limits of normal in size. - Right atrium: Central venous pressure (est): 3 mm Hg. - Tricuspid valve: There was trivial regurgitation. - Pulmonary arteries: Systolic pressure could not be accurately estimated. - Pericardium, extracardiac: There was no pericardial effusion.  Impressions:  - Limited images overall. Mild LVH with LVEF approximately 50-55%, cannot exclude inferior hypokinesis, grade 1 diastolic dysfunction. Appears to be an area of moderate calcification involving the noncoronary or left coronary cusp and adjacent annulus. Not well seen, cannot completely exclude a calcified vegetation or mass. This could be further visualized by TEE, if felt to be clinically appropriate. Trivial tricuspid regurgitation, unable to assess PASP.    ASSESSMENT AND PLAN  39 year old [place] American female with past medical history significant for hypertension, diabetes and morbid obesity hospitalized on 06/04/14 for acute on chronic CHF secondary to malignant hypertension.   1. Acute on chronic diastolic HF  - in the setting of uncontrolled HTN and 2 L IV fluid resuscitation with diarrhea - now euvolemic, Crea improved,  continue Lasix 40 mg PO daily.  check BMP today, also CBC as microcytic anemia  in October.  - Crea improving 1 -->1.2--> 1.4 --> 1.3--> 1.18 -->1.02  - need aggressive BP control - At the last visit patient reported 18 pounds and weight loss, in the last 2 weeks she has lost another 6 pounds.  2. Uncontrolled HTN:  - added losartan 25 mg po daily, her blood pressure again elevated repeat 1 45 mmHg, however patient states can be very low, for now we'll continue the same management and I expect that her blood pressure management as she is losing significant amount of weight.  3. ?AV calcification  - Reviewed the echo image, does not believe she need TEE at this time. A-febrile, low suspicion for endocarditis. Calcified lesion make endocarditis unlikely too   4. Diabetes - improve control, HbA1c 10.3%-->8.1%, continues to loose weight  5. Morbid obesity - great weight loss, encourage to continue exercising, she has lost over 25 pounds in the last few months.  6. SS train - severe microcytic anemia - on iv iron, followed by PCP  Follow up in 3 months, BMP prior to the visit.  Signed, Dorothy Spark MD, [facility] 06/16/2014

## 2014-12-21 NOTE — Patient Instructions (Signed)
Medication Instructions:  Your physician recommends that you continue on your current medications as directed. Please refer to the Current Medication list given to you today.   Labwork:  AROUND 2 AND 1/2 MONTHS, PRIOR TO YOUR 3 MONTH OFFICE VISIT WITH DR Delton SeeNELSON TO CHECK A CMET    Follow-Up: Your physician wants you to follow-up in: 3 MONTHS WITH DR NELSON---HAVE LABS DONE PRIOR TO THIS APPOINTMENT You will receive a reminder letter in the mail two months in advance. If you don't receive a letter, please call our office to schedule the follow-up appointment.

## 2014-12-31 ENCOUNTER — Other Ambulatory Visit: Payer: Self-pay | Admitting: Family Medicine

## 2014-12-31 ENCOUNTER — Other Ambulatory Visit: Payer: Self-pay

## 2014-12-31 MED ORDER — CARVEDILOL 25 MG PO TABS: 25.0000 mg | ORAL_TABLET | Freq: Two times a day (BID) | ORAL | Status: DC

## 2015-01-02 ENCOUNTER — Other Ambulatory Visit: Payer: Self-pay | Admitting: Family Medicine

## 2015-01-03 ENCOUNTER — Other Ambulatory Visit: Payer: Self-pay | Admitting: Obstetrics

## 2015-01-03 DIAGNOSIS — N63 Unspecified lump in unspecified breast: Secondary | ICD-10-CM

## 2015-01-05 ENCOUNTER — Other Ambulatory Visit: Payer: BLUE CROSS/BLUE SHIELD

## 2015-01-05 ENCOUNTER — Ambulatory Visit: Payer: BLUE CROSS/BLUE SHIELD | Admitting: Hematology & Oncology

## 2015-01-07 ENCOUNTER — Other Ambulatory Visit: Payer: BLUE CROSS/BLUE SHIELD

## 2015-01-11 IMAGING — CR DG CHEST 2V
2 series · 2 of 2 positions shown · non-contrast
Comparison: 05/30/2014

CLINICAL DATA: CHF, followup, history hypertension, diabetes

EXAM:
CHEST  2 VIEW

[w chest pa]
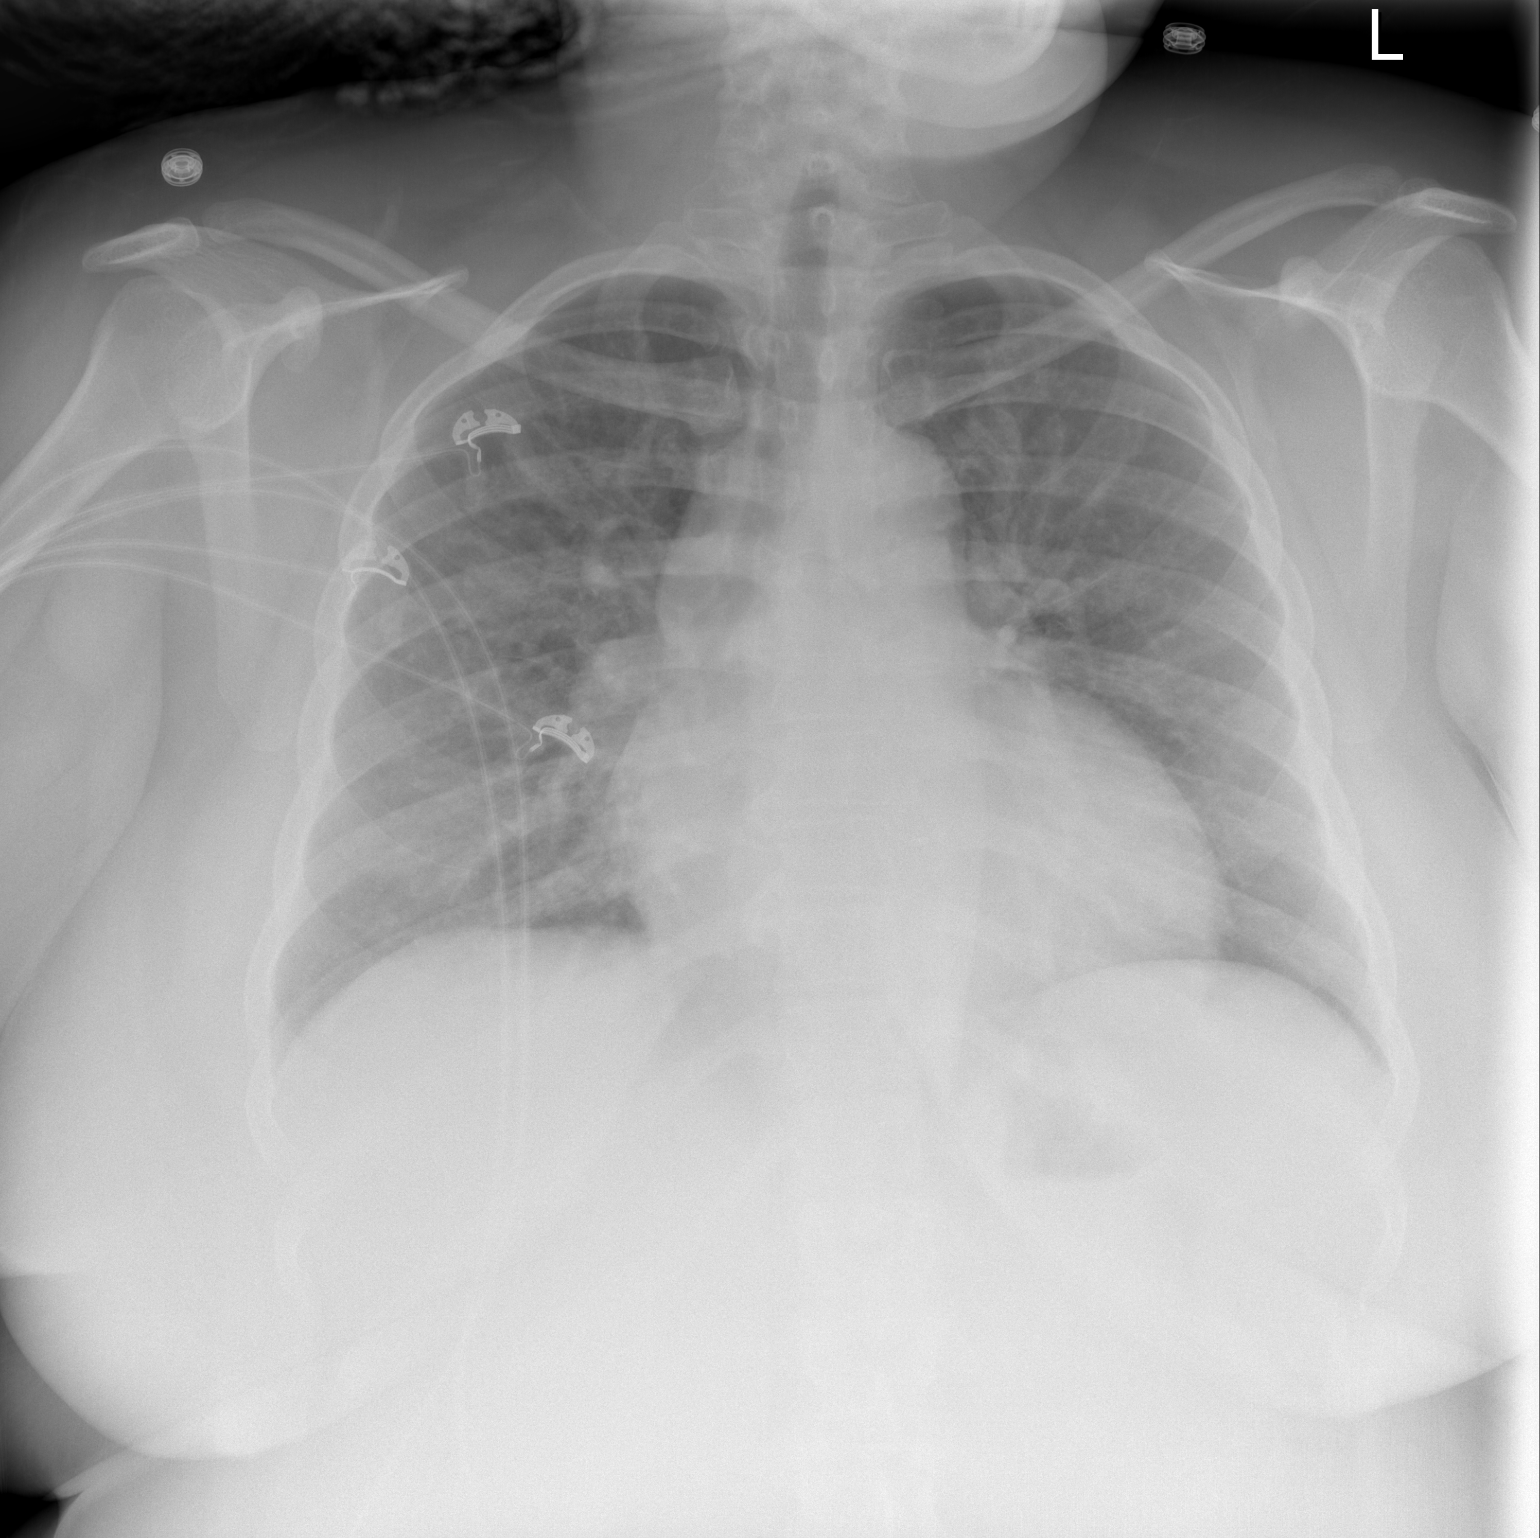

[w chest lat *]
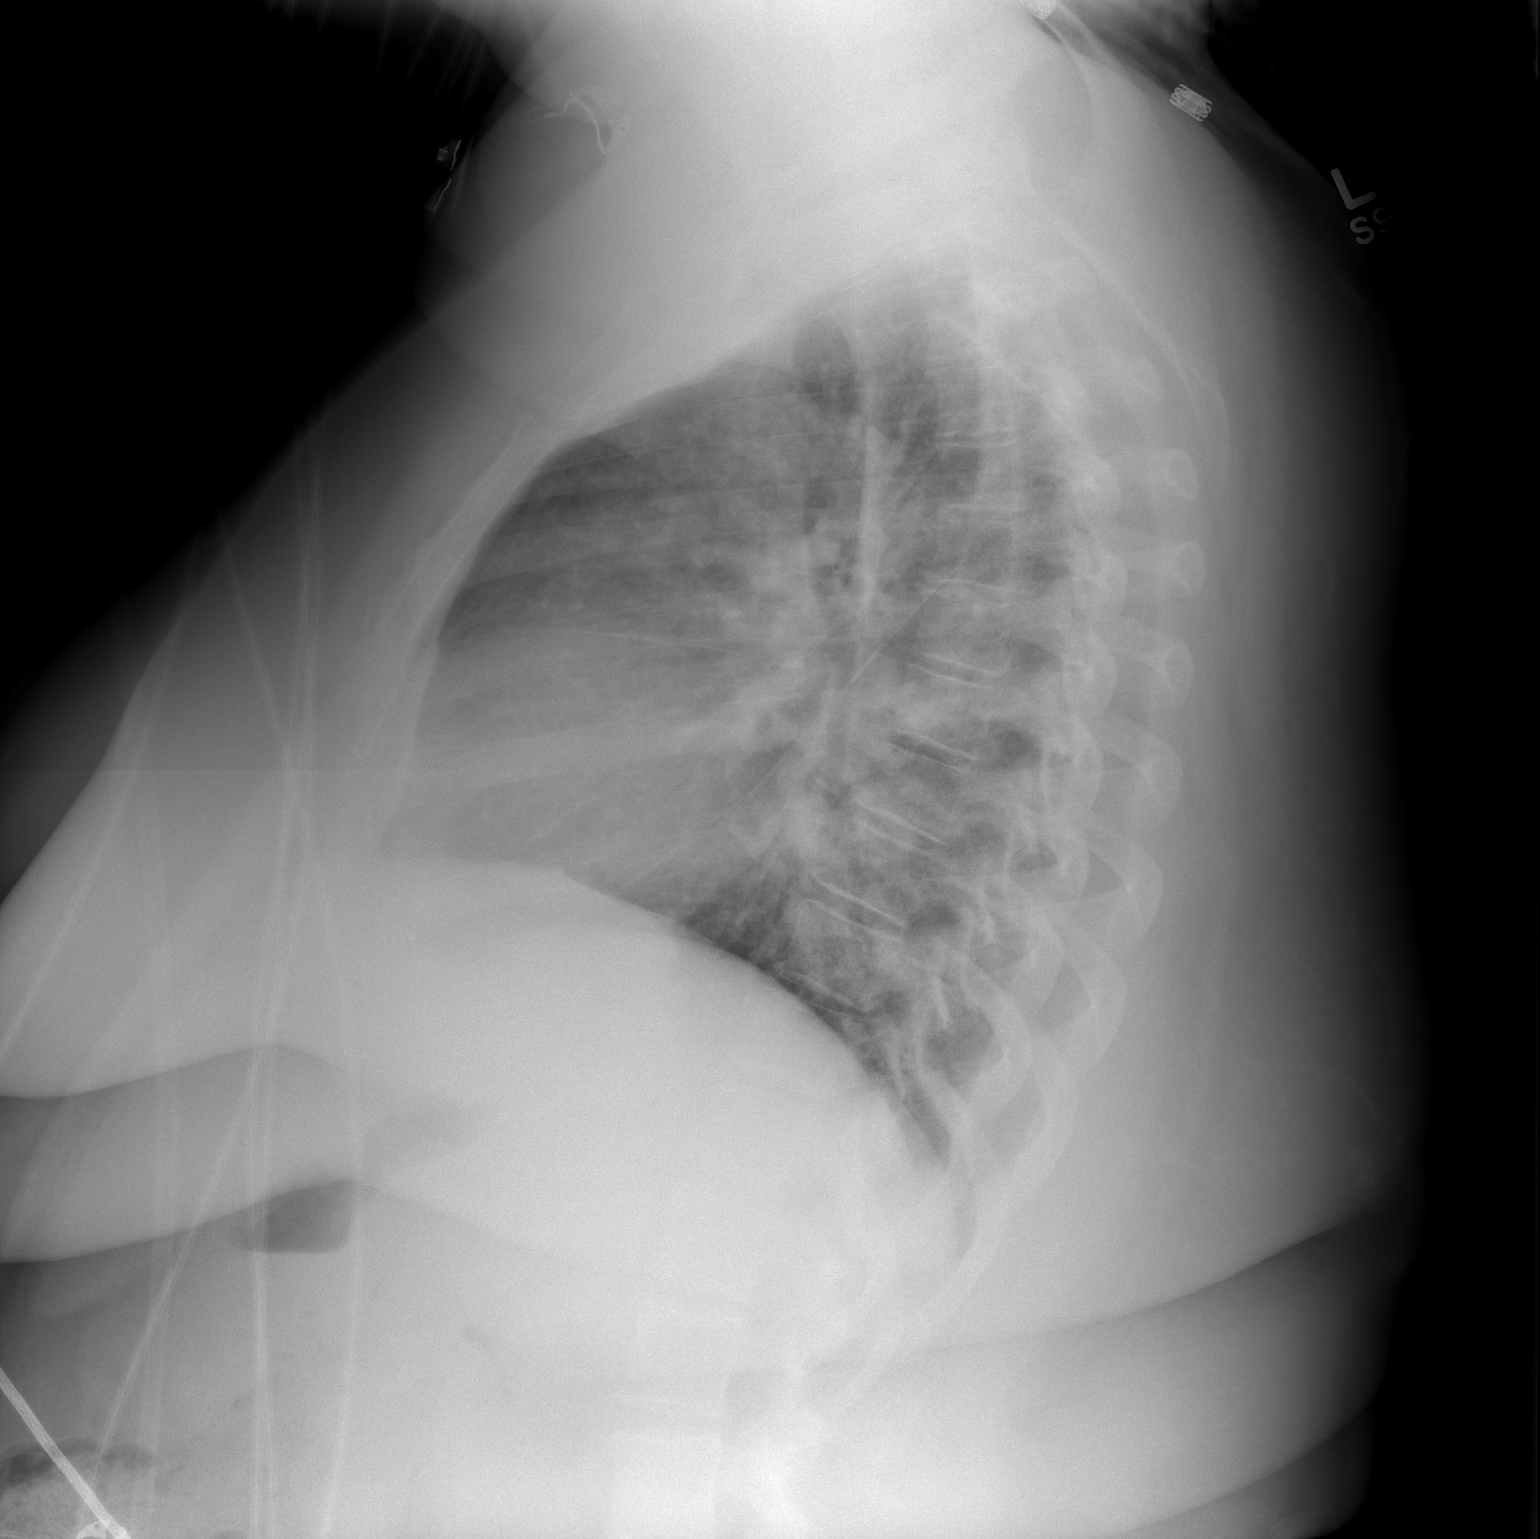

[2 of 2 positions shown; findings below may reference images not displayed]

FINDINGS: Enlargement of cardiac silhouette with pulmonary vascular
congestion.

Improved interstitial edema since previous exam.

No segmental consolidation, pleural effusion or pneumothorax.

Bones unremarkable.
IMPRESSION: Improved pulmonary edema.

## 2015-01-17 ENCOUNTER — Telehealth: Payer: Self-pay

## 2015-01-17 MED ORDER — INSULIN ASPART PROT & ASPART (70-30 MIX) 100 UNIT/ML ~~LOC~~ SUSP: 15.0000 [IU] | Freq: Two times a day (BID) | SUBCUTANEOUS | Status: DC

## 2015-01-17 NOTE — Telephone Encounter (Signed)
Pt called and LM that her insulin requires a PA. Called pt and she verified that it is the Novolog 70/30 requiring the PA and she recently got new ins. I advised pt that this is usually just a matter of ins company preferring the Humalog instead of Novolog, but that I will check w/pharm, and also ask them about Relion.

## 2015-01-17 NOTE — Telephone Encounter (Signed)
Pharm advised that the message from ins was that they prefer the Humalog 75/25, not 70/30. They also reported that the Relion 70/30 is only $24.88 cash price. I checked w/pt and she agreed that she would just get the [employer] brand OOP since it is the same strength she has been using and costs less than co-pay of the Novolog she has been on.

## 2015-02-22 ENCOUNTER — Other Ambulatory Visit: Payer: Self-pay | Admitting: Emergency Medicine

## 2015-02-22 ENCOUNTER — Other Ambulatory Visit: Payer: Self-pay | Admitting: Cardiology

## 2015-03-25 ENCOUNTER — Other Ambulatory Visit: Payer: Self-pay | Admitting: Physician Assistant

## 2015-03-28 ENCOUNTER — Telehealth: Payer: Self-pay

## 2015-03-28 NOTE — Telephone Encounter (Signed)
Pt went to get her prescriptions filled, but was not able to get her metformin filled because she needs an OV.  She says she can not afford to come in right now and was hoping we could refill it for just a week or two until she can get in.  [phone]

## 2015-03-29 NOTE — Telephone Encounter (Signed)
Was refilled yesterday. Can inform patient she is able to pick up at pharmacy.

## 2015-03-29 NOTE — Telephone Encounter (Signed)
Can we refill? 

## 2015-03-30 NOTE — Telephone Encounter (Signed)
Pt.notified

## 2015-04-03 ENCOUNTER — Other Ambulatory Visit: Payer: Self-pay | Admitting: Emergency Medicine

## 2015-04-14 ENCOUNTER — Telehealth: Payer: Self-pay

## 2015-04-14 ENCOUNTER — Ambulatory Visit (INDEPENDENT_AMBULATORY_CARE_PROVIDER_SITE_OTHER): Payer: 59 | Admitting: Family Medicine

## 2015-04-14 VITALS — BP 124/80 | HR 73 | Temp 98.1°F | Resp 14 | Ht 62.5 in | Wt 271.0 lb

## 2015-04-14 DIAGNOSIS — I5033 Acute on chronic diastolic (congestive) heart failure: Secondary | ICD-10-CM | POA: Diagnosis not present

## 2015-04-14 DIAGNOSIS — E119 Type 2 diabetes mellitus without complications: Secondary | ICD-10-CM | POA: Diagnosis not present

## 2015-04-14 DIAGNOSIS — D649 Anemia, unspecified: Secondary | ICD-10-CM

## 2015-04-14 LAB — GLUCOSE, POCT (MANUAL RESULT ENTRY): POC Glucose: 82 mg/dl (ref 70–99)

## 2015-04-14 LAB — POCT GLYCOSYLATED HEMOGLOBIN (HGB A1C): HEMOGLOBIN A1C: 5.9

## 2015-04-14 MED ORDER — METFORMIN HCL 1000 MG PO TABS: 1000.0000 mg | ORAL_TABLET | Freq: Two times a day (BID) | ORAL | Status: DC

## 2015-04-14 MED ORDER — AMLODIPINE BESYLATE 10 MG PO TABS: 10.0000 mg | ORAL_TABLET | Freq: Every day | ORAL | Status: DC

## 2015-04-14 NOTE — Telephone Encounter (Signed)
REFILLS    amLODipine (NORVASC) 10 MG tablet metFORMIN (GLUCOPHAGE) 1000 MG tablet   [employer] [address]   [phone] (H)

## 2015-04-14 NOTE — Progress Notes (Addendum)
Subjective:  This chart was scribed for Corliss Parish, MD by Leandra Kern, Medical Scribe. This patient was seen in Room 8 and the patient's care was started at 1:12 PM.   Patient ID: Brittany Lopez, female    DOB: [birth year], 39 y.o.   MRN: [id number]  Chief Complaint  Patient presents with  . recheck CHF    HPI HPI Comments: Brittany Lopez is a 39 y.o. female whose PCP is Dr. Laney Pastor, she does not appear to have seen him recently. She has seen multiple other providers such as Dr. Everlene Farrier, Dr. Linna Darner, Tor Netters, Dr. Elder Cyphers, and Dr. Federico Flake. She has a history of multiple medical problems including the following:  Acute on chronic diastolic heart failure/ HTN:  followed by [facility] with most recent visit with Dr. Meda Coffee on April 12th. She is on Lasix 40 mg daily. She takes Coreg and norvasc.  BP was uncontrolled at last cardiology visit. Losartan, 20 mg qd had been added previously, and based on pt's home readings, same doses were continued at her last cardiology visit. Most resent ECHO in September 2015, ef 50-55%. Normal BNP on Arpil 2016. Pt her next appointment with Cardiology soon. She notes that she is compliant with her medications. Pt does not recall being advised to take aspirin. She denies chest pain, difficulty breathing. Pt notes that she does regular exercise by walking about 3-4 days a week. She indicates that she eats fast food about once or twice a week.   Diabetes with obesity:  Last A1C was on February 2016 was 6.2. She had an elevated urine micro albumin in Feb of this year. Had recommended Lisinopril, but had since started on losartan. She is on 15 units of 70:30 insulin BID, and metformin 1000 mg BID. Weight at April cardiology visit was 261. She is back up 10 lbs to 271. She has been losing weight based on their office visit. Pt reports that with long immobility period such as sitting in the bathroom for a long time, she experiences numbness in her legs  - but only notes this with sitting. Has not had symptoms otherwise. She notes that she last seen an optometrist about one year ago in 2015.   Anemia: She is followed by [place], Dr. Marin Olp. Last visit Mar 16th, Iron deficiency anemia and history of sickle cell trait. She was dosed with Feraheme on March 16th and Mar 24th. Pt notes that she is over-due for her follow up with Dr. Marin Olp. She denies feeling more fatigued, headache, light-headedness, or dizziness.    Also notes a possible cyst on her left leg, onset few days ago. Pt notes that she did have a cyst removed multiple years ago from the outside of her left knee, upper leg which presented with the same symptoms as the one she has today, it was bigger in size than the current one however.  Pt is also requesting refills on her medications of metformin and Norvasc.     Patient Active Problem List   Diagnosis Date Noted  . Iron deficiency anemia 11/24/2014  . Sickle cell trait 11/08/2014  . Microalbuminuria 11/08/2014  . Chronic diastolic CHF (congestive heart failure), NYHA class 2 08/23/2014  . Morbid obesity 08/23/2014  . Acute diastolic CHF (congestive heart failure), NYHA class 3 06/01/2014  . Hypoxia 05/31/2014  . Acute respiratory failure with hypoxia 05/31/2014  . Uncontrolled hypertension 05/31/2014  . Type 2 diabetes mellitus with hyperglycemia  03/30/2014  . Essential hypertension 03/30/2014   Past Medical History  Diagnosis Date  . Diabetes mellitus without complication   . Hypertension   . Obesity    Past Surgical History  Procedure Laterality Date  . Cesarean section     No Known Allergies Prior to Admission medications   Medication Sig Start Date End Date Taking? Authorizing Provider  amLODipine (NORVASC) 10 MG tablet TAKE ONE TABLET BY MOUTH ONCE DAILY 04/03/15  Yes Darlyne Russian, MD  carvedilol (COREG) 25 MG tablet Take 1 tablet (25 mg total) by mouth 2 (two) times daily with a  meal. 12/31/14  Yes Dorothy Spark, MD  folic acid (FOLVITE) 1 MG tablet Take 1 tablet (1 mg total) by mouth daily. 11/24/14  Yes Eliezer Bottom, NP  furosemide (LASIX) 40 MG tablet TAKE ONE TABLET BY MOUTH ONCE DAILY 02/23/15  Yes Dorothy Spark, MD  glucose monitoring kit (FREESTYLE) monitoring kit 1 each by Does not apply route as needed for other. 06/04/14  Yes Modena Jansky, MD  insulin aspart protamine- aspart (NOVOLOG MIX 70/30) (70-30) 100 UNIT/ML injection Inject 0.15 mLs (15 Units total) into the skin 2 (two) times daily with a meal. May use Relion brand from [place]. 01/17/15  Yes Darlyne Russian, MD  insulin NPH-regular Human (NOVOLIN 70/30 RELION) (70-30) 100 UNIT/ML injection INJECT 0.15 MLS (15 UNITS TOTAL) INTO THE SKIN 2 TIMES DAILY WITH A MEAL PATIENT NEEDS OFFICE VISIT FOR MORE REFILLS" 2ND 02/23/15  Yes Mancel Bale, PA-C  Insulin Syringe-Needle U-100 (SAFETY-GLIDE 0.3CC SYR 29GX1/2) 29G X 1/2" 0.3 ML MISC Use as directed. 06/04/14  Yes Modena Jansky, MD  losartan (COZAAR) 25 MG tablet Take 1 tablet (25 mg total) by mouth daily. 12/01/14  Yes Dorothy Spark, MD  metFORMIN (GLUCOPHAGE) 1000 MG tablet Take 1 tablet (1,000 mg total) by mouth 2 (two) times daily with a meal. NO MORE REFILLS WITHOUT OFFICE VISIT - 2ND NOTICE 03/28/15  Yes Leandrew Koyanagi, MD   History   Social History  . Marital Status: Married    Spouse Name: N/A  . Number of Children: N/A  . Years of Education: N/A   Occupational History  . Not on file.   Social History Main Topics  . Smoking status: Never Smoker   . Smokeless tobacco: Not on file  . Alcohol Use: No  . Drug Use: No  . Sexual Activity: Not on file   Other Topics Concern  . Not on file   Social History Narrative    Review of Systems  Constitutional: Negative for fatigue and unexpected weight change.  Respiratory: Negative for chest tightness and shortness of breath.   Cardiovascular: Negative for chest pain, palpitations  and leg swelling.  Gastrointestinal: Negative for abdominal pain and blood in stool.  Neurological: Positive for numbness. Negative for dizziness, syncope, light-headedness and headaches.       Objective:   Physical Exam  Constitutional: She is oriented to person, place, and time. She appears well-developed and well-nourished. No distress.  HENT:  Head: Normocephalic and atraumatic.  Eyes: Conjunctivae and EOM are normal. Pupils are equal, round, and reactive to light.  Neck: Neck supple. Carotid bruit is not present.  Cardiovascular: Normal rate, regular rhythm, normal heart sounds and intact distal pulses.   Pulmonary/Chest: Effort normal and breath sounds normal.  Abdominal: Soft. She exhibits no pulsatile midline mass. There is no tenderness.  Musculoskeletal:  No appreciable lower extremity edema.  Microfilament  testing was normal.     Neurological: She is alert and oriented to person, place, and time. No cranial nerve deficit.  Skin: Skin is warm and dry.  Left lower leg, approximately half to 1 cm mobile soft area consistent with probable lipoma. Nontender, no skin changes  Psychiatric: She has a normal mood and affect. Her behavior is normal.  Nursing note and vitals reviewed.   Filed Vitals:   04/14/15 1131  BP: 124/80  Pulse: 73  Temp: 98.1 F (36.7 C)  TempSrc: Oral  Resp: 14  Height: 5' 2.5" (1.588 m)  Weight: 271 lb (122.925 kg)  SpO2: 98%   Results for orders placed or performed in visit on 04/14/15  POCT glucose (manual entry)  Result Value Ref Range   POC Glucose 82 70 - 99 mg/dl  POCT glycosylated hemoglobin (Hb A1C)  Result Value Ref Range   Hemoglobin A1C 5.9        Assessment & Plan:   Brittany Lopez is a 39 y.o. female Diabetes type 2, controlled - Plan: POCT glucose (manual entry), POCT glycosylated hemoglobin (Hb A1C), Lipid panel  -Improved with very good A1c today. However cautioned on symptoms of hypoglycemia with this prescription  control, although she has not noted to experience these symptoms recently. No changes in medication doses for now, but may need to decrease doses of 70/30 insulin if she has any lows. RTC precautions, recheck A1c in 3 months.  Acute on chronic diastolic heart failure  -Stable by symptoms, continue follow-up with cardiologist.  Anemia, unspecified anemia type  -Recommended calling her hematologist for follow-up.  Probable lipoma on lower leg. Only noted for few days. If increase in size, soreness, or skin changes, return to clinic for repeat evaluation.  Meds ordered this encounter  Medications  . DISCONTD: amLODipine (NORVASC) 10 MG tablet    Sig: Take 1 tablet (10 mg total) by mouth daily.    Dispense:  90 tablet    Refill:  1  . DISCONTD: metFORMIN (GLUCOPHAGE) 1000 MG tablet    Sig: Take 1 tablet (1,000 mg total) by mouth 2 (two) times daily with a meal.    Dispense:  180 tablet    Refill:  1   Patient Instructions  Keep follow-up with cardiologist and schedule appointment with your hematologist as planned. Continue same doses of medications for right now, but watch out for low blood sugar symptoms as your hemoglobin A1c is lower than in the past. See information below on signs or symptoms of hypoglycemia, and carrying some form of sugar or candy with you at all times if this were to happen. If your sugars are staying under 100 at times, would decrease the dosing of your insulin by 2-3 units each time.  Return to the clinic or go to the nearest emergency room if any of your symptoms worsen or new symptoms occur.   Make sure you see your ophthalmologist once per year, and dentist ideally every 6 months. Exercise 150 minutes per week walking initially, any increased exercise would discuss with your cardiologist what they feel is safe for you to participate in.  Portion sizes as we discussed and avoidance of sugary beverages such as Sweet tea, sodas, and avoidance of fast food as much as  possible. Recheck diabetes in next 3 months. Follow-up with Dr. Laney Pastor, or may need to find another provider here accepting new patients so that you are able to schedule appointments 3 months at a time.  They can  assist with this at checkout or our schedulers can also help with this.  Hypoglycemia Hypoglycemia occurs when the glucose in your blood is too low. Glucose is a type of sugar that is your body's main energy source. Hormones, such as insulin and glucagon, control the level of glucose in the blood. Insulin lowers blood glucose and glucagon increases blood glucose. Having too much insulin in your blood stream, or not eating enough food containing sugar, can result in hypoglycemia. Hypoglycemia can happen to people with or without diabetes. It can develop quickly and can be a medical emergency.  CAUSES   Missing or delaying meals.  Not eating enough carbohydrates at meals.  Taking too much diabetes medicine.  Not timing your oral diabetes medicine or insulin doses with meals, snacks, and exercise.  Nausea and vomiting.  Certain medicines.  Severe illnesses, such as hepatitis, kidney disorders, and certain eating disorders.  Increased activity or exercise without eating something extra or adjusting medicines.  Drinking too much alcohol.  A nerve disorder that affects body functions like your heart rate, blood pressure, and digestion (autonomic neuropathy).  A condition where the stomach muscles do not function properly (gastroparesis). Therefore, medicines and food may not absorb properly.  Rarely, a tumor of the pancreas can produce too much insulin. SYMPTOMS   Hunger.  Sweating (diaphoresis).  Change in body temperature.  Shakiness.  Headache.  Anxiety.  Lightheadedness.  Irritability.  Difficulty concentrating.  Dry mouth.  Tingling or numbness in the hands or feet.  Restless sleep or sleep disturbances.  Altered speech and coordination.  Change  in mental status.  Seizures or prolonged convulsions.  Combativeness.  Drowsiness (lethargic).  Weakness.  Increased heart rate or palpitations.  Confusion.  Pale, gray skin color.  Blurred or double vision.  Fainting. DIAGNOSIS  A physical exam and medical history will be performed. Your caregiver may make a diagnosis based on your symptoms. Blood tests and other lab tests may be performed to confirm a diagnosis. Once the diagnosis is made, your caregiver will see if your signs and symptoms go away once your blood glucose is raised.  TREATMENT  Usually, you can easily treat your hypoglycemia when you notice symptoms.  Check your blood glucose. If it is less than 70 mg/dl, take one of the following:   3-4 glucose tablets.    cup juice.    cup regular soda.   1 cup skim milk.   -1 tube of glucose gel.   5-6 hard candies.   Avoid high-fat drinks or food that may delay a rise in blood glucose levels.  Do not take more than the recommended amount of sugary foods, drinks, gel, or tablets. Doing so will cause your blood glucose to go too high.   Wait 10-15 minutes and recheck your blood glucose. If it is still less than 70 mg/dl or below your target range, repeat treatment.   Eat a snack if it is more than 1 hour until your next meal.  There may be a time when your blood glucose may go so low that you are unable to treat yourself at home when you start to notice symptoms. You may need someone to help you. You may even faint or be unable to swallow. If you cannot treat yourself, someone will need to bring you to the hospital.  [place]  If you have diabetes, follow your diabetes management plan by:  Taking your medicines as directed.  Following your exercise plan.  Following  your meal plan. Do not skip meals. Eat on time.  Testing your blood glucose regularly. Check your blood glucose before and after exercise. If you exercise longer or  different than usual, be sure to check blood glucose more frequently.  Wearing your medical alert jewelry that says you have diabetes.  Identify the cause of your hypoglycemia. Then, develop ways to prevent the recurrence of hypoglycemia.  Do not take a hot bath or shower right after an insulin shot.  Always carry treatment with you. Glucose tablets are the easiest to carry.  If you are going to drink alcohol, drink it only with meals.  Tell friends or family members ways to keep you safe during a seizure. This may include removing hard or sharp objects from the area or turning you on your side.  Maintain a healthy weight. SEEK MEDICAL CARE IF:   You are having problems keeping your blood glucose in your target range.  You are having frequent episodes of hypoglycemia.  You feel you might be having side effects from your medicines.  You are not sure why your blood glucose is dropping so low.  You notice a change in vision or a new problem with your vision. SEEK IMMEDIATE MEDICAL CARE IF:   Confusion develops.  A change in mental status occurs.  The inability to swallow develops.  Fainting occurs. Document Released: 08/27/2005 Document Revised: 09/01/2013 Document Reviewed: 12/24/2011 [facility] Patient Information 2015 [place], [place]. This information is not intended to replace advice given to you by your health care provider. Make sure you discuss any questions you have with your health care provider.       I personally performed the services described in this documentation, which was scribed in my presence. The recorded information has been reviewed and considered, and addended by me as needed.

## 2015-04-14 NOTE — Patient Instructions (Addendum)
Keep follow-up with cardiologist and schedule appointment with your hematologist as planned. Continue same doses of medications for right now, but watch out for low blood sugar symptoms as your hemoglobin A1c is lower than in the past. See information below on signs or symptoms of hypoglycemia, and carrying some form of sugar or candy with you at all times if this were to happen. If your sugars are staying under 100 at times, would decrease the dosing of your insulin by 2-3 units each time.  Return to the clinic or go to the nearest emergency room if any of your symptoms worsen or new symptoms occur.   Make sure you see your ophthalmologist once per year, and dentist ideally every 6 months. Exercise 150 minutes per week walking initially, any increased exercise would discuss with your cardiologist what they feel is safe for you to participate in.  Portion sizes as we discussed and avoidance of sugary beverages such as Sweet tea, sodas, and avoidance of fast food as much as possible. Recheck diabetes in next 3 months. Follow-up with Dr. Merla Riches, or may need to find another provider here accepting new patients so that you are able to schedule appointments 3 months at a time.  They can assist with this at checkout or our schedulers can also help with this.  Hypoglycemia Hypoglycemia occurs when the glucose in your blood is too low. Glucose is a type of sugar that is your body's main energy source. Hormones, such as insulin and glucagon, control the level of glucose in the blood. Insulin lowers blood glucose and glucagon increases blood glucose. Having too much insulin in your blood stream, or not eating enough food containing sugar, can result in hypoglycemia. Hypoglycemia can happen to people with or without diabetes. It can develop quickly and can be a medical emergency.  CAUSES   Missing or delaying meals.  Not eating enough carbohydrates at meals.  Taking too much diabetes medicine.  Not timing  your oral diabetes medicine or insulin doses with meals, snacks, and exercise.  Nausea and vomiting.  Certain medicines.  Severe illnesses, such as hepatitis, kidney disorders, and certain eating disorders.  Increased activity or exercise without eating something extra or adjusting medicines.  Drinking too much alcohol.  A nerve disorder that affects body functions like your heart rate, blood pressure, and digestion (autonomic neuropathy).  A condition where the stomach muscles do not function properly (gastroparesis). Therefore, medicines and food may not absorb properly.  Rarely, a tumor of the pancreas can produce too much insulin. SYMPTOMS   Hunger.  Sweating (diaphoresis).  Change in body temperature.  Shakiness.  Headache.  Anxiety.  Lightheadedness.  Irritability.  Difficulty concentrating.  Dry mouth.  Tingling or numbness in the hands or feet.  Restless sleep or sleep disturbances.  Altered speech and coordination.  Change in mental status.  Seizures or prolonged convulsions.  Combativeness.  Drowsiness (lethargic).  Weakness.  Increased heart rate or palpitations.  Confusion.  Pale, gray skin color.  Blurred or double vision.  Fainting. DIAGNOSIS  A physical exam and medical history will be performed. Your caregiver may make a diagnosis based on your symptoms. Blood tests and other lab tests may be performed to confirm a diagnosis. Once the diagnosis is made, your caregiver will see if your signs and symptoms go away once your blood glucose is raised.  TREATMENT  Usually, you can easily treat your hypoglycemia when you notice symptoms.  Check your blood glucose. If it is less than  70 mg/dl, take one of the following:   3-4 glucose tablets.    cup juice.    cup regular soda.   1 cup skim milk.   -1 tube of glucose gel.   5-6 hard candies.   Avoid high-fat drinks or food that may delay a rise in blood glucose  levels.  Do not take more than the recommended amount of sugary foods, drinks, gel, or tablets. Doing so will cause your blood glucose to go too high.   Wait 10-15 minutes and recheck your blood glucose. If it is still less than 70 mg/dl or below your target range, repeat treatment.   Eat a snack if it is more than 1 hour until your next meal.  There may be a time when your blood glucose may go so low that you are unable to treat yourself at home when you start to notice symptoms. You may need someone to help you. You may even faint or be unable to swallow. If you cannot treat yourself, someone will need to bring you to the hospital.  HOME CARE INSTRUCTIONS  If you have diabetes, follow your diabetes management plan by:  Taking your medicines as directed.  Following your exercise plan.  Following your meal plan. Do not skip meals. Eat on time.  Testing your blood glucose regularly. Check your blood glucose before and after exercise. If you exercise longer or different than usual, be sure to check blood glucose more frequently.  Wearing your medical alert jewelry that says you have diabetes.  Identify the cause of your hypoglycemia. Then, develop ways to prevent the recurrence of hypoglycemia.  Do not take a hot bath or shower right after an insulin shot.  Always carry treatment with you. Glucose tablets are the easiest to carry.  If you are going to drink alcohol, drink it only with meals.  Tell friends or family members ways to keep you safe during a seizure. This may include removing hard or sharp objects from the area or turning you on your side.  Maintain a healthy weight. SEEK MEDICAL CARE IF:   You are having problems keeping your blood glucose in your target range.  You are having frequent episodes of hypoglycemia.  You feel you might be having side effects from your medicines.  You are not sure why your blood glucose is dropping so low.  You notice a change in  vision or a new problem with your vision. SEEK IMMEDIATE MEDICAL CARE IF:   Confusion develops.  A change in mental status occurs.  The inability to swallow develops.  Fainting occurs. Document Released: 08/27/2005 Document Revised: 09/01/2013 Document Reviewed: 12/24/2011 [facility] Patient Information 2015 [place], [place]. This information is not intended to replace advice given to you by your health care provider. Make sure you discuss any questions you have with your health care provider.

## 2015-04-15 MED ORDER — METFORMIN HCL 1000 MG PO TABS: 1000.0000 mg | ORAL_TABLET | Freq: Two times a day (BID) | ORAL | Status: DC

## 2015-04-15 MED ORDER — AMLODIPINE BESYLATE 10 MG PO TABS
10.0000 mg | ORAL_TABLET | Freq: Every day | ORAL | Status: DC
Start: 2015-04-15 — End: 2015-11-01

## 2015-04-15 NOTE — Telephone Encounter (Signed)
Rx sent. Left message letting pt know. 

## 2015-06-29 ENCOUNTER — Other Ambulatory Visit: Payer: Self-pay | Admitting: Cardiology

## 2015-07-28 ENCOUNTER — Other Ambulatory Visit: Payer: Self-pay | Admitting: Family Medicine

## 2015-08-08 ENCOUNTER — Encounter: Payer: Self-pay | Admitting: Internal Medicine

## 2015-09-11 ENCOUNTER — Encounter (HOSPITAL_COMMUNITY): Payer: Self-pay | Admitting: *Deleted

## 2015-09-11 ENCOUNTER — Emergency Department (HOSPITAL_COMMUNITY)
Admission: EM | Admit: 2015-09-11 | Discharge: 2015-09-11 | Disposition: A | Discharge status: Home / Self Care | Payer: BLUE CROSS/BLUE SHIELD | Attending: Emergency Medicine | Admitting: Emergency Medicine

## 2015-09-11 DIAGNOSIS — J3489 Other specified disorders of nose and nasal sinuses: Secondary | ICD-10-CM | POA: Insufficient documentation

## 2015-09-11 DIAGNOSIS — Z794 Long term (current) use of insulin: Secondary | ICD-10-CM | POA: Insufficient documentation

## 2015-09-11 DIAGNOSIS — Z79899 Other long term (current) drug therapy: Secondary | ICD-10-CM | POA: Insufficient documentation

## 2015-09-11 DIAGNOSIS — Z3202 Encounter for pregnancy test, result negative: Secondary | ICD-10-CM | POA: Insufficient documentation

## 2015-09-11 DIAGNOSIS — E669 Obesity, unspecified: Secondary | ICD-10-CM | POA: Insufficient documentation

## 2015-09-11 DIAGNOSIS — R42 Dizziness and giddiness: Secondary | ICD-10-CM | POA: Insufficient documentation

## 2015-09-11 DIAGNOSIS — R7989 Other specified abnormal findings of blood chemistry: Secondary | ICD-10-CM | POA: Insufficient documentation

## 2015-09-11 DIAGNOSIS — I10 Essential (primary) hypertension: Secondary | ICD-10-CM | POA: Insufficient documentation

## 2015-09-11 DIAGNOSIS — E119 Type 2 diabetes mellitus without complications: Secondary | ICD-10-CM | POA: Insufficient documentation

## 2015-09-11 LAB — URINALYSIS, ROUTINE W REFLEX MICROSCOPIC
BILIRUBIN URINE: NEGATIVE
GLUCOSE, UA: NEGATIVE mg/dL
KETONES UR: NEGATIVE mg/dL
Leukocytes, UA: NEGATIVE
Nitrite: NEGATIVE
PROTEIN: 100 mg/dL — AB
Specific Gravity, Urine: 1.014 (ref 1.005–1.030)
pH: 5 (ref 5.0–8.0)

## 2015-09-11 LAB — CBC WITH DIFFERENTIAL/PLATELET
Basophils Absolute: 0 10*3/uL (ref 0.0–0.1)
Basophils Relative: 1 %
EOS ABS: 0.1 10*3/uL (ref 0.0–0.7)
EOS PCT: 1 %
HCT: 33.1 % — ABNORMAL LOW (ref 36.0–46.0)
Hemoglobin: 10.9 g/dL — ABNORMAL LOW (ref 12.0–15.0)
LYMPHS ABS: 3.2 10*3/uL (ref 0.7–4.0)
Lymphocytes Relative: 41 %
MCH: 23.6 pg — AB (ref 26.0–34.0)
MCHC: 32.9 g/dL (ref 30.0–36.0)
MCV: 71.6 fL — AB (ref 78.0–100.0)
MONO ABS: 0.6 10*3/uL (ref 0.1–1.0)
MONOS PCT: 8 %
Neutro Abs: 3.8 10*3/uL (ref 1.7–7.7)
Neutrophils Relative %: 49 %
PLATELETS: 160 10*3/uL (ref 150–400)
RBC: 4.62 MIL/uL (ref 3.87–5.11)
RDW: 16.1 % — ABNORMAL HIGH (ref 11.5–15.5)
WBC: 7.7 10*3/uL (ref 4.0–10.5)

## 2015-09-11 LAB — URINE MICROSCOPIC-ADD ON
Bacteria, UA: NONE SEEN
RBC / HPF: NONE SEEN RBC/hpf (ref 0–5)
WBC UA: NONE SEEN WBC/hpf (ref 0–5)

## 2015-09-11 LAB — CBG MONITORING, ED: Glucose-Capillary: 158 mg/dL — ABNORMAL HIGH (ref 65–99)

## 2015-09-11 LAB — BASIC METABOLIC PANEL
Anion gap: 10 (ref 5–15)
BUN: 25 mg/dL — AB (ref 6–20)
CHLORIDE: 108 mmol/L (ref 101–111)
CO2: 22 mmol/L (ref 22–32)
CREATININE: 1.12 mg/dL — AB (ref 0.44–1.00)
Calcium: 9.2 mg/dL (ref 8.9–10.3)
GFR calc Af Amer: >60 mL/min (ref >60)
GFR calc non Af Amer: >60 mL/min (ref >60)
GLUCOSE: 178 mg/dL — AB (ref 65–99)
Potassium: 4.1 mmol/L (ref 3.5–5.1)
SODIUM: 140 mmol/L (ref 135–145)

## 2015-09-11 LAB — PREGNANCY, URINE: PREG TEST UR: NEGATIVE

## 2015-09-11 MED ORDER — SODIUM CHLORIDE 0.9 % IV BOLUS (SEPSIS)
1000.0000 mL | Freq: Once | INTRAVENOUS | Status: AC
  Administered 2015-09-11: 1000 mL via INTRAVENOUS

## 2015-09-11 NOTE — ED Notes (Signed)
RN Tim starting an IV and obtaining labs

## 2015-09-11 NOTE — ED Notes (Addendum)
Patient states she has had a cold for a couple of days and has noticed that in the morning after she takes her Metformin, Carvidilol, Amlodipine and NPH, she starts to feel light headed.  Patient has also noticed that her BP has been "low" for her - 130/80's.  Patient's high CBG has been in 140's and lows in 120's.  Patient has had some vomiting after aggressive coughing, but otherwise denies N/V/D and ongoing fever.  She had a temp of >100 for several hours on first day of cold.  Patient denies abdominal pain, chest pain, wheezing and shortness of breath.  Patient has been using Theraflu and diabetic Robitussin for cough with some relief.  Patient is not on sliding scale insulin and takes 15 units of NPH no matter her CBG.  Patient's last A1C was 5.8.

## 2015-09-11 NOTE — ED Notes (Signed)
BLOOD DRAW UNSUCCESSFUL 

## 2015-09-11 NOTE — ED Notes (Signed)
PT DISCHARGED. INSTRUCTIONS GIVEN. AAOX3. PT IN NO APPARENT DISTRESS OR PAIN. THE OPPORTUNITY TO ASK QUESTIONS WAS PROVIDED. 

## 2015-09-11 NOTE — ED Notes (Signed)
She tells me she feels as if this (lightheadedness) may be related to her known anemia.  She states she underwent several doses of I.V. Fe+ about a year ago.  She is in no distress.

## 2015-09-11 NOTE — Discharge Instructions (Signed)

## 2015-09-11 NOTE — ED Provider Notes (Signed)
CSN: [id number]     Arrival date & time 09/11/15  1157 History   First MD Initiated Contact with Patient 09/11/15 1223     Chief Complaint  Patient presents with  . Dizziness     (Consider location/radiation/quality/duration/timing/severity/associated sxs/prior Treatment) HPI Comments: Patient presents with dizziness. She states her last 2 days she's had a feeling of being lightheaded. No vertiginous-type symptoms. No headache. This happens mostly in the morning after she's taking her medications. She hasn't made any recent changes to her medications. She states she's been eating well. She denies any chest pain or shortness of breath. She denies any pain. No nausea vomiting or diarrhea. She has had some recent URI symptoms which she says has improved over the last few days. She denies any chest congestion or worsening cough. There is no known fevers. No urinary symptoms.  Patient is a 40 y.o. female presenting with dizziness.  Dizziness Associated symptoms: no blood in stool, no chest pain, no diarrhea, no headaches, no nausea, no shortness of breath, no vomiting and no weakness     Past Medical History  Diagnosis Date  . Diabetes mellitus without complication ([place])   . Hypertension   . Obesity    Past Surgical History  Procedure Laterality Date  . Cesarean section     Family History  Problem Relation Age of Onset  . Diabetes Mother   . Heart disease Mother   . Hypertension Mother   . Stroke Mother   . Sjogren's syndrome Mother    Social History  Substance Use Topics  . Smoking status: Never Smoker   . Smokeless tobacco: Never Used  . Alcohol Use: No   OB History    No data available     Review of Systems  Constitutional: Negative for fever, chills, diaphoresis and fatigue.  HENT: Positive for congestion, postnasal drip and rhinorrhea. Negative for sneezing.   Eyes: Negative.   Respiratory: Negative for cough, chest tightness and shortness of breath.   Cardiovascular:  Negative for chest pain and leg swelling.  Gastrointestinal: Negative for nausea, vomiting, abdominal pain, diarrhea and blood in stool.  Genitourinary: Negative for frequency, hematuria, flank pain and difficulty urinating.  Musculoskeletal: Negative for back pain and arthralgias.  Skin: Negative for rash.  Neurological: Positive for dizziness and light-headedness. Negative for speech difficulty, weakness, numbness and headaches.      Allergies  Review of patient's allergies indicates no known allergies.  Home Medications   Prior to Admission medications   Medication Sig Start Date End Date Taking? Authorizing Provider  amLODipine (NORVASC) 10 MG tablet Take 1 tablet (10 mg total) by mouth daily. 04/15/15  Yes Leandrew Koyanagi, MD  carvedilol (COREG) 25 MG tablet TAKE ONE TABLET BY MOUTH TWICE DAILY WITH  MEALS 06/29/15  Yes Dorothy Spark, MD  folic acid (FOLVITE) 1 MG tablet Take 1 tablet (1 mg total) by mouth daily. 11/24/14  Yes Eliezer Bottom, NP  furosemide (LASIX) 40 MG tablet TAKE ONE TABLET BY MOUTH ONCE DAILY 06/29/15  Yes Dorothy Spark, MD  glucose monitoring kit (FREESTYLE) monitoring kit 1 each by Does not apply route as needed for other. 06/04/14  Yes Modena Jansky, MD  insulin NPH-regular Human (NOVOLIN 70/30 RELION) (70-30) 100 UNIT/ML injection INJECT 0.15 MLS (15 UNITS TOTAL) INTO THE SKIN 2 TIMES DAILY WITH A MEAL PATIENT NEEDS OFFICE VISIT FOR MORE REFILLS" 2ND 02/23/15  Yes Mancel Bale, PA-C  Insulin Syringe-Needle U-100 (SAFETY-GLIDE 0.3CC SYR 29GX1/2)  29G X 1/2" 0.3 ML MISC Use as directed. 06/04/14  Yes Modena Jansky, MD  losartan (COZAAR) 25 MG tablet Take 1 tablet (25 mg total) by mouth daily. 12/01/14  Yes Dorothy Spark, MD  metFORMIN (GLUCOPHAGE) 1000 MG tablet Take 1 tablet (1,000 mg total) by mouth 2 (two) times daily with a meal. 04/15/15  Yes Leandrew Koyanagi, MD   BP 172/86 mmHg  Pulse 86  Temp(Src) 98.3 F (36.8 C) (Oral)  Resp 20   SpO2 97%  LMP 09/06/2015 Physical Exam  Constitutional: She is oriented to person, place, and time. She appears well-developed and well-nourished.  HENT:  Head: Normocephalic and atraumatic.  Right Ear: External ear normal.  Left Ear: External ear normal.  Mouth/Throat: Oropharynx is clear and moist.  Eyes: Conjunctivae and EOM are normal. Pupils are equal, round, and reactive to light.  No nystagmus  Neck: Normal range of motion. Neck supple.  Cardiovascular: Normal rate, regular rhythm and normal heart sounds.   Pulmonary/Chest: Effort normal and breath sounds normal. No respiratory distress. She has no wheezes. She has no rales. She exhibits no tenderness.  Abdominal: Soft. Bowel sounds are normal. There is no tenderness. There is no rebound and no guarding.  Musculoskeletal: Normal range of motion. She exhibits no edema.  Lymphadenopathy:    She has no cervical adenopathy.  Neurological: She is alert and oriented to person, place, and time.  Skin: Skin is warm and dry. No rash noted.  Psychiatric: She has a normal mood and affect.    ED Course  Procedures (including critical care time) Labs Review Labs Reviewed  BASIC METABOLIC PANEL - Abnormal; Notable for the following:    Glucose, Bld 178 (*)    BUN 25 (*)    Creatinine, Ser 1.12 (*)    All other components within normal limits  CBC WITH DIFFERENTIAL/PLATELET - Abnormal; Notable for the following:    Hemoglobin 10.9 (*)    HCT 33.1 (*)    MCV 71.6 (*)    MCH 23.6 (*)    RDW 16.1 (*)    All other components within normal limits  URINALYSIS, ROUTINE W REFLEX MICROSCOPIC (NOT AT [facility]) - Abnormal; Notable for the following:    Hgb urine dipstick LARGE (*)    Protein, ur 100 (*)    All other components within normal limits  URINE MICROSCOPIC-ADD ON - Abnormal; Notable for the following:    Squamous Epithelial / LPF 0-5 (*)    All other components within normal limits  CBG MONITORING, ED - Abnormal; Notable for the  following:    Glucose-Capillary 158 (*)    All other components within normal limits  PREGNANCY, URINE    Imaging Review No results found. I have personally reviewed and evaluated these images and lab results as part of my medical decision-making.   EKG Interpretation   Date/Time:  Sunday September 11 2015 12:04:25 EST Ventricular Rate:  69 PR Interval:  158 QRS Duration: 98 QT Interval:  392 QTC Calculation: 420 R Axis:   22 Text Interpretation:  Sinus rhythm Baseline wander in lead(s) V3 Confirmed  by Roxann Vierra  MD, Hurman Ketelsen ([id number]) on 09/11/2015 1:36:32 PM      MDM   Final diagnoses:  Dizziness  Elevated serum creatinine    PT presents with lightheadedness.  Feeling better after fluids.  Ambulating without symptoms.  May be related to mild dehydration.  Says she hasn't been drinking well since she had her cold.  Lungs clear  without signs of pneumonia.  No fever.  No symptoms consistent with CAD.  hgb at baseline.  Creatinine mildly elevated.  Discussed this with pt that she needs f/u with her PCP regarding this.    Malvin Johns, MD 09/11/15 [phone]

## 2015-10-10 ENCOUNTER — Ambulatory Visit (INDEPENDENT_AMBULATORY_CARE_PROVIDER_SITE_OTHER): Payer: BLUE CROSS/BLUE SHIELD | Admitting: Internal Medicine

## 2015-10-10 VITALS — BP 148/98 | HR 85 | Temp 98.9°F | Resp 16 | Ht 63.0 in | Wt 275.0 lb

## 2015-10-10 DIAGNOSIS — M7581 Other shoulder lesions, right shoulder: Secondary | ICD-10-CM

## 2015-10-10 DIAGNOSIS — I10 Essential (primary) hypertension: Secondary | ICD-10-CM | POA: Diagnosis not present

## 2015-10-10 DIAGNOSIS — Z23 Encounter for immunization: Secondary | ICD-10-CM

## 2015-10-10 DIAGNOSIS — D509 Iron deficiency anemia, unspecified: Secondary | ICD-10-CM | POA: Diagnosis not present

## 2015-10-10 DIAGNOSIS — R809 Proteinuria, unspecified: Secondary | ICD-10-CM | POA: Diagnosis not present

## 2015-10-10 DIAGNOSIS — E1165 Type 2 diabetes mellitus with hyperglycemia: Secondary | ICD-10-CM | POA: Diagnosis not present

## 2015-10-10 DIAGNOSIS — I5031 Acute diastolic (congestive) heart failure: Secondary | ICD-10-CM | POA: Diagnosis not present

## 2015-10-10 MED ORDER — METFORMIN HCL 1000 MG PO TABS: 1000.0000 mg | ORAL_TABLET | Freq: Two times a day (BID) | ORAL | Status: DC

## 2015-10-10 MED ORDER — LOSARTAN POTASSIUM 100 MG PO TABS: 100.0000 mg | ORAL_TABLET | Freq: Every day | ORAL | Status: DC

## 2015-10-10 NOTE — Progress Notes (Signed)
Subjective:  By signing my name below, I, Rawaa Al Lopez, attest that this documentation has been prepared under the direction and in the presence of Tami Lin, MD.  Brittany Lopez, Medical Scribe. 10/10/2015.  3:26 PM.   Patient ID: Brittany Lopez, female    DOB: 05-03-1976, 40 y.o.   MRN: [id number]  Chief Complaint  Patient presents with  . Annual Exam    complete physical     HPI HPI Comments: Brittany Lopez is a 40 y.o. female with a history of DM, HTN, CHF, and sickle cell trait who presents to [facility] for fu probs.  Pt reports intermittent right arm/shoulder pain with raising the arm only, and she notes that the pain is only located in one spot. She indicates that when she sleeps she tucks her arm under her body as she sleeps in a stomach position, and notes that she had the area massaged by her husband which was painful, however she did find relief with it. 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 Pt denies difficulty with brushing her teeth.   Pt is also requesting medications refills.   DM: Pt reports that her diabetes is controlled, and consistently gets blood sugar readings in the 80's. She reports her highest reading recently was 113.   HTN: Pt states that her blood pressure has been elevated.   Sickle Cell Trait: Pt does not recall whether her anemia at her previous blood work is related to her sickle cell trait.   Pt states that she will be travelling by car to [place] for a funeral, and is requesting advise for the prolonged inactivity periods.     She has ophthalmology/optometry follow-up scheduled for later this year and was last seen 2 years ago. She declines Pneumovax. She agrees to tetanus update  Patient Active Problem List   Diagnosis Date Noted  . Iron deficiency anemia 11/24/2014  . Sickle cell trait ([place]) 11/08/2014  . Microalbuminuria 11/08/2014  . Chronic diastolic CHF (congestive heart failure), NYHA  class 2 ([place]) 08/23/2014  . Morbid obesity ([place]) 08/23/2014  . Acute diastolic CHF (congestive heart failure), NYHA class 3 ([place]) 06/01/2014  . Hypoxia 05/31/2014  . Acute respiratory failure with hypoxia ([place]) 05/31/2014  . Uncontrolled hypertension 05/31/2014  . Type 2 diabetes mellitus with hyperglycemia ([place]) 03/30/2014  . Essential hypertension 03/30/2014   Past Medical History  Diagnosis Date  . Diabetes mellitus without complication ([place])   . Hypertension   . Obesity    Past Surgical History  Procedure Laterality Date  . Cesarean section     No Known Allergies Prior to Admission medications   Medication Sig Start Date End Date Taking? Authorizing Provider  amLODipine (NORVASC) 10 MG tablet Take 1 tablet (10 mg total) by mouth daily. 04/15/15  Yes Leandrew Koyanagi, MD  carvedilol (COREG) 25 MG tablet TAKE ONE TABLET BY MOUTH TWICE DAILY WITH  MEALS 06/29/15  Yes Dorothy Spark, MD  folic acid (FOLVITE) 1 MG tablet Take 1 tablet (1 mg total) by mouth daily. 11/24/14  Yes Eliezer Bottom, NP  furosemide (LASIX) 40 MG tablet TAKE ONE TABLET BY MOUTH ONCE DAILY 06/29/15  Yes Dorothy Spark, MD  glucose monitoring kit (FREESTYLE) monitoring kit 1 each by Does not apply route as needed for other. 06/04/14  Yes Modena Jansky, MD  insulin NPH-regular Human (NOVOLIN 70/30 RELION) (70-30) 100 UNIT/ML injection INJECT 0.15 MLS (15 UNITS  TOTAL) INTO THE SKIN 2 TIMES DAILY WITH A MEAL PATIENT NEEDS OFFICE VISIT FOR MORE REFILLS" 2ND 02/23/15  Yes Mancel Bale, PA-C  Insulin Syringe-Needle U-100 (SAFETY-GLIDE 0.3CC SYR 29GX1/2) 29G X 1/2" 0.3 ML MISC Use as directed. 06/04/14  Yes Modena Jansky, MD  losartan (COZAAR) 25 MG tablet Take 1 tablet (25 mg total) by mouth daily. 12/01/14  Yes Dorothy Spark, MD  metFORMIN (GLUCOPHAGE) 1000 MG tablet Take 1 tablet (1,000 mg total) by mouth 2 (two) times daily with a meal. 04/15/15  Yes Leandrew Koyanagi, MD   Social History   Social  History  . Marital Status: Married    Spouse Name: N/A  . Number of Children: N/A  . Years of Education: N/A   Occupational History  . Not on file.   Social History Main Topics  . Smoking status: Never Smoker   . Smokeless tobacco: Never Used  . Alcohol Use: No  . Drug Use: No  . Sexual Activity: Not on file   Other Topics Concern  . Not on file   Social History Narrative    Review of Systems  Constitutional: Negative for activity change, appetite change, fatigue and unexpected weight change.  HENT: Negative for trouble swallowing.   Eyes: Negative for visual disturbance.  Respiratory: Negative for cough, chest tightness and shortness of breath.   Cardiovascular: Negative for chest pain, palpitations and leg swelling.  Gastrointestinal: Negative for abdominal pain.  Genitourinary: Negative for difficulty urinating.  Musculoskeletal: Positive for myalgias and arthralgias.  Neurological: Negative for headaches.  Psychiatric/Behavioral: Negative for sleep disturbance, decreased concentration and agitation.      Objective:   Physical Exam  Constitutional: She is oriented to person, place, and time. She appears well-developed and well-nourished. No distress.  HENT:  Head: Normocephalic and atraumatic.  Mouth/Throat: Oropharynx is clear and moist.  Eyes: Conjunctivae and EOM are normal. Pupils are equal, round, and reactive to light.  Neck: Neck supple. No thyromegaly present.  Cardiovascular: Normal rate, regular rhythm, normal heart sounds and intact distal pulses.   No murmur heard. Pulmonary/Chest: Effort normal and breath sounds normal. No respiratory distress.  Musculoskeletal: She exhibits no edema.  Right shoulder is tender to palpation over the upper deltoid at shoulder and has a painful arc 25-75. Abduction against resistance painful. Adduction and full external rotation full. No crepitus.  Lymphadenopathy:    She has no cervical adenopathy.  Neurological: She is  alert and oriented to person, place, and time. She has normal reflexes. No cranial nerve deficit.  No loss of sensation on the plantar aspect of both feet  Skin: Skin is warm and dry.  Psychiatric: She has a normal mood and affect. Her behavior is normal.  Nursing note and vitals reviewed.   BP 148/98 mmHg  Pulse 85  Temp(Src) 98.9 F (37.2 C) (Oral)  Resp 16  Ht _0  (1.6 m)  Wt 275 lb (124.739 kg)  BMI 48.73 kg/m2  SpO2 97%  LMP 10/06/2015 (Approximate)    Her last A1c was below 7 and does not be to be repeated today/ her other labs also been stable and will be repeated follow-up  Assessment & Plan:  Essential hypertension uncontrolled- w/ hx of Acute diastolic CHF (congestive heart failure), NYHA class 3 (HCC)--now asympto  Plan: losartan (COZAAR) increased from 25 to 100 MG tablet  Type 2 diabetes mellitus with hyperglycemia, unspecified long term insulin use status (HCC)-stable for now in fact her blood sugars range  between 74 and 100 most of the time and she is given the option of decreasing her metformin slightly as long as her blood sugars remained below 110 in the morning.  Morbid obesity, unspecified obesity type (HCC)--focus on weight loss through whole30 diet  Microalbuminuria  Iron deficiency anemia--continue daily iron  Rotator cuff tendinitis, right--exercises one month and if fails we'll do physical therapy Okay for meloxicam//ice when painful  Tdap  Meds ordered this encounter  Medications  . losartan (COZAAR) 100 MG tablet    Sig: Take 1 tablet (100 mg total) by mouth daily.    Dispense:  90 tablet    Refill:  3  . metFORMIN (GLUCOPHAGE) 1000 MG tablet    Sig: Take 1 tablet (1,000 mg total) by mouth 2 (two) times daily with a meal.    Dispense:  180 tablet    Refill:  1   continue Coreg and Norvasc as prescribed Follow-up 6 months  I have completed the patient encounter in its entirety as documented by the scribe, with editing by me where  necessary. Bryony Kaman P. Laney Pastor, M.D.

## 2015-10-10 NOTE — Patient Instructions (Signed)
May try 1/2 tab of met formin morning and evening if your sugar is too low You could look at whole 30 diet

## 2015-10-12 ENCOUNTER — Telehealth: Payer: Self-pay

## 2015-10-12 NOTE — Telephone Encounter (Signed)
Pt states she have questions regarding the medication given by Dr Merla Riches. Please call [phone]

## 2015-10-19 NOTE — Telephone Encounter (Signed)
Spoke to pt and was able to answer her question.  Pt had a question regarding Cozaar. Wanted to make sure she had the dose right.

## 2015-10-31 ENCOUNTER — Telehealth: Payer: Self-pay

## 2015-10-31 NOTE — Telephone Encounter (Signed)
Pt states the pharmacy have been trying for a week now to get her NORVASC 10 MG refilled but was told I never saw a request from the pharmacy Please call [phone]    [facility] ON [address] IN [place]

## 2015-11-01 ENCOUNTER — Other Ambulatory Visit: Payer: Self-pay | Admitting: *Deleted

## 2015-11-01 DIAGNOSIS — I10 Essential (primary) hypertension: Secondary | ICD-10-CM

## 2015-11-01 MED ORDER — AMLODIPINE BESYLATE 10 MG PO TABS: 10.0000 mg | ORAL_TABLET | Freq: Every day | ORAL | Status: DC

## 2015-11-21 ENCOUNTER — Other Ambulatory Visit: Payer: Self-pay | Admitting: Family

## 2015-12-21 ENCOUNTER — Other Ambulatory Visit: Payer: Self-pay | Admitting: Family

## 2016-01-19 ENCOUNTER — Other Ambulatory Visit: Payer: Self-pay | Admitting: Cardiology

## 2016-01-19 ENCOUNTER — Other Ambulatory Visit: Payer: Self-pay | Admitting: Family

## 2016-02-19 ENCOUNTER — Other Ambulatory Visit: Payer: Self-pay | Admitting: Cardiology

## 2016-02-19 ENCOUNTER — Other Ambulatory Visit: Payer: Self-pay | Admitting: Family

## 2016-03-09 ENCOUNTER — Ambulatory Visit (INDEPENDENT_AMBULATORY_CARE_PROVIDER_SITE_OTHER): Payer: BLUE CROSS/BLUE SHIELD | Admitting: Physician Assistant

## 2016-03-09 VITALS — BP 155/88 | HR 91 | Temp 98.2°F | Resp 16 | Ht 63.0 in | Wt 260.0 lb

## 2016-03-09 DIAGNOSIS — Z114 Encounter for screening for human immunodeficiency virus [HIV]: Secondary | ICD-10-CM

## 2016-03-09 DIAGNOSIS — R319 Hematuria, unspecified: Secondary | ICD-10-CM | POA: Diagnosis not present

## 2016-03-09 DIAGNOSIS — M25559 Pain in unspecified hip: Secondary | ICD-10-CM | POA: Diagnosis not present

## 2016-03-09 DIAGNOSIS — E1165 Type 2 diabetes mellitus with hyperglycemia: Secondary | ICD-10-CM | POA: Diagnosis not present

## 2016-03-09 DIAGNOSIS — I10 Essential (primary) hypertension: Secondary | ICD-10-CM | POA: Diagnosis not present

## 2016-03-09 LAB — POC MICROSCOPIC URINALYSIS (UMFC): MUCUS RE: ABSENT

## 2016-03-09 LAB — POCT URINALYSIS DIP (MANUAL ENTRY)
Bilirubin, UA: NEGATIVE
GLUCOSE UA: NEGATIVE
Ketones, POC UA: NEGATIVE
Leukocytes, UA: NEGATIVE
NITRITE UA: NEGATIVE
Protein Ur, POC: >=300 — AB
Spec Grav, UA: 1.01
UROBILINOGEN UA: 0.2
pH, UA: 5

## 2016-03-09 MED ORDER — AMLODIPINE BESYLATE 10 MG PO TABS: 10.0000 mg | ORAL_TABLET | Freq: Every day | ORAL | Status: DC

## 2016-03-09 MED ORDER — FUROSEMIDE 40 MG PO TABS: 40.0000 mg | ORAL_TABLET | Freq: Every day | ORAL | Status: DC

## 2016-03-09 MED ORDER — CARVEDILOL 25 MG PO TABS: 25.0000 mg | ORAL_TABLET | Freq: Two times a day (BID) | ORAL | Status: DC

## 2016-03-09 MED ORDER — METFORMIN HCL 1000 MG PO TABS: 1000.0000 mg | ORAL_TABLET | Freq: Two times a day (BID) | ORAL | Status: DC

## 2016-03-09 NOTE — Progress Notes (Signed)
Patient ID: Brittany Lopez, female    DOB: June 08, 1976, 40 y.o.   MRN: [id number]  PCP: Leandrew Koyanagi, MD  Subjective:   Chief Complaint  Patient presents with  . Follow-up    diabetes, BP    HPI Presents for evaluation of diabetes, HTN, and bilateral hip pain.  Reports taking her medications as prescribed, without adverse effects.  Hip pain occurs with and following exercise. She goes to a gym, often with her husband, and does a variety of cardio and strength training. Sometimes the pain is pulsing. She doesn't like to take medications, so only occasionally takes ibuprofen for it, but when she does, it helps. Also, hydrotherapy after her workout helps.  She notes that she collects life-like dolls, made to look and feel like a real infant. She often carries a doll with her, in a car seat, and recently saw that most people carry the infant car carrier seat incorrectly, putting considerable strain on their back. She has started trying to carry it properly, and notices a difference.  BP home monitoring: 130's-170/85-100 Glucose home monitoring: 75-140.  At her last visit, Dr. Laney Pastor advised she reduce the metformin dose, but she saw a rise in the glucose and resumed the 1000 mg BID dosing. She is working on [employer] and exercises about 3 x/week.  She recalls that she was previously on a higher dose of furosemide, but it was reduced "because I got too dry.".    Review of Systems Denies chest pain, shortness of breath, HA, dizziness, vision change, nausea, vomiting, diarrhea, constipation, melena, hematochezia, dysuria, increased urinary urgency or frequency, increased hunger or thirst, unintentional weight change, other unexplained myalgias or arthralgias, rash.     Patient Active Problem List   Diagnosis Date Noted  . Iron deficiency anemia 11/24/2014  . Sickle cell trait ([place]) 11/08/2014  . Microalbuminuria 11/08/2014  . Chronic diastolic CHF (congestive  heart failure), NYHA class 2 ([place]) 08/23/2014  . Morbid obesity ([place]) 08/23/2014  . Acute diastolic CHF (congestive heart failure), NYHA class 3 ([place]) 06/01/2014  . Hypoxia 05/31/2014  . Acute respiratory failure with hypoxia ([place]) 05/31/2014  . Uncontrolled hypertension 05/31/2014  . Type 2 diabetes mellitus with hyperglycemia ([place]) 03/30/2014  . Essential hypertension 03/30/2014     Prior to Admission medications   Medication Sig Start Date End Date Taking? Authorizing Provider  amLODipine (NORVASC) 10 MG tablet Take 1 tablet (10 mg total) by mouth daily. 11/01/15  Yes Mancel Bale, PA-C  carvedilol (COREG) 25 MG tablet Take 1 tablet (25 mg total) by mouth 2 (two) times daily with a meal. Pt is overdue for an appt. Please call to schedule for further refills 02/21/16  Yes Dorothy Spark, MD  folic acid (FOLVITE) 1 MG tablet TAKE ONE TABLET BY MOUTH DAILY 02/20/16  Yes Eliezer Bottom, NP  furosemide (LASIX) 40 MG tablet Take 1 tablet (40 mg total) by mouth daily. Pt is overdue for an appointment. Please call and schedule for further refills 02/21/16  Yes Dorothy Spark, MD  glucose monitoring kit (FREESTYLE) monitoring kit 1 each by Does not apply route as needed for other. 06/04/14  Yes Modena Jansky, MD  insulin NPH-regular Human (NOVOLIN 70/30 RELION) (70-30) 100 UNIT/ML injection INJECT 0.15 MLS (15 UNITS TOTAL) INTO THE SKIN 2 TIMES DAILY WITH A MEAL PATIENT NEEDS OFFICE VISIT FOR MORE REFILLS" 2ND 02/23/15  Yes Mancel Bale, PA-C  Insulin Syringe-Needle U-100 (SAFETY-GLIDE 0.3CC SYR 29GX1/2) 29G  X 1/2" 0.3 ML MISC Use as directed. 06/04/14  Yes Modena Jansky, MD  losartan (COZAAR) 100 MG tablet Take 1 tablet (100 mg total) by mouth daily. 10/10/15  Yes Leandrew Koyanagi, MD  metFORMIN (GLUCOPHAGE) 1000 MG tablet Take 1 tablet (1,000 mg total) by mouth 2 (two) times daily with a meal. 10/10/15  Yes Leandrew Koyanagi, MD     No Known Allergies     Objective:  Physical  Exam  Constitutional: She is oriented to person, place, and time. She appears well-developed and well-nourished. No distress.  BP 155/88 mmHg  Pulse 91  Temp(Src) 98.2 F (36.8 C)  Resp 16  Ht '5\' 3"'  (1.6 m)  Wt 260 lb (117.935 kg)  BMI 46.07 kg/m2  SpO2 99%  LMP 03/09/2016   Eyes: Conjunctivae are normal. No scleral icterus.  Neck: No thyromegaly present.  Cardiovascular: Normal rate, regular rhythm, normal heart sounds and intact distal pulses.   Pulmonary/Chest: Effort normal and breath sounds normal.  Musculoskeletal:       Right hip: She exhibits tenderness. She exhibits normal range of motion, normal strength and no bony tenderness.       Left hip: Normal.       Lumbar back: Normal.  Lymphadenopathy:    She has no cervical adenopathy.  Neurological: She is alert and oriented to person, place, and time.  Skin: Skin is warm and dry.  Psychiatric: She has a normal mood and affect. Her speech is normal and behavior is normal.      Results for orders placed or performed in visit on 03/09/16  POCT urinalysis dipstick  Result Value Ref Range   Color, UA yellow yellow   Clarity, UA clear clear   Glucose, UA negative negative   Bilirubin, UA negative negative   Ketones, POC UA negative negative   Spec Grav, UA 1.010    Blood, UA large (A) negative   pH, UA 5.0    Protein Ur, POC >=300 (A) negative   Urobilinogen, UA 0.2    Nitrite, UA Negative Negative   Leukocytes, UA Negative Negative  POCT Microscopic Urinalysis ([facility])  Result Value Ref Range   WBC,UR,HPF,POC None None WBC/hpf   RBC,UR,HPF,POC Moderate (A) None RBC/hpf   Bacteria None None, Too numerous to count   Mucus Absent Absent   Epithelial Cells, UR Per Microscopy Few (A) None, Too numerous to count cells/hpf        Assessment & Plan:   1. Uncontrolled hypertension Await lab results. She is over due for follow-up with cardiology, and I wonder if she isn't out of the amlodipine, based on fill dates.  That would certainly contribute to the elevated readings we are seeing. She'll follow-up there and if pressure remains elevated, would appreciate recommendations for improving control. - POCT Microscopic Urinalysis ([facility]) - CBC with Differential/Platelet - Comprehensive metabolic panel - furosemide (LASIX) 40 MG tablet; Take 1 tablet (40 mg total) by mouth daily.  Dispense: 30 tablet; Refill: 0 - carvedilol (COREG) 25 MG tablet; Take 1 tablet (25 mg total) by mouth 2 (two) times daily with a meal.  Dispense: 60 tablet; Refill: 0 - amLODipine (NORVASC) 10 MG tablet; Take 1 tablet (10 mg total) by mouth daily.  Dispense: 30 tablet; Refill: 0  2. Type 2 diabetes mellitus with hyperglycemia, unspecified long term insulin use status (HCC) Await A1C. Adjust insulin if indicated. - Hemoglobin A1c - POCT urinalysis dipstick - POCT Microscopic Urinalysis ([facility]) - Microalbumin, urine - metFORMIN (  GLUCOPHAGE) 1000 MG tablet; Take 1 tablet (1,000 mg total) by mouth 2 (two) times daily with a meal.  Dispense: 180 tablet; Refill: 1  3. Screening for HIV (human immunodeficiency virus) - HIV antibody  4. Hip pain, unspecified laterality Recommend that she meet with a trainer at her gym to work on core strengthening and stability. If persists, would image her lumbar spine and hips.  5. Hematuria and proteinuria She is currently menstruating, which is the most likely cause for this. Await UCx and recheck in 2 weeks. - Urine culture   Fara Chute, PA-C Physician Assistant-Certified [facility]

## 2016-03-09 NOTE — Patient Instructions (Addendum)
Please schedule a visit with your eye specialist.  Please also schedule a visit with cardiology ASAP. I've provided a 1 month supply of those medications to get you through to a visit there.  Connect with a Systems analystpersonal trainer at [employer].  Let them know your provider believes you may have a weak core and could use help identifying the exercises to help strengthen your core and help you develop a work out regimen that wont put excess strain on your body.  Continue to use hydrotherapy for sore muscle relief.   IF you received an x-ray today, you will receive an invoice from [facility]. Please contact [facility] at [phone] with questions or concerns regarding your invoice.   IF you received labwork today, you will receive an invoice from [facility]. Please contact [facility] at [phone] with questions or concerns regarding your invoice.   Our billing staff will not be able to assist you with questions regarding bills from these companies.  You will be contacted with the lab results as soon as they are available. The fastest way to get your results is to activate your My Chart account. Instructions are located on the last page of this paperwork. If you have not heard from us regarding the results in 2 weeks, please contact this office.

## 2016-03-09 NOTE — Progress Notes (Signed)
Subjective:    Patient ID: Brittany Lopez, female    DOB: [birth year], 40 y.o.   MRN: [id number]  Chief Complaint  Patient presents with  . Follow-up    diabetes, BP    HPI  Patient is a 40yo female who presents today for management of type 2 DM, well controlled with insulin and metformin and HTN currently uncontrolled with amlopidine and losartan.  Compliant with medications.  Complains of hip pain associated with cardio and circuit exercises, feels like "pulsing heartbeat"  Will take ibuprofen occasionally for pain, which does help relieve pain, but tries to avoid taking more medication.  Hydrotherapy following exercising, helps relieve pain.  Attributes increased pain on the right side to carrying around baby dolls in a car seat carrier.  Exercising at least 3x weeks Diet - eliminated pork, cut down portions, carbs, sugars, low fat, reading labels, avoids salt, drinks water  Deep itch in her feet,  Deneis HA, Chest pain, SOB, change in urination, change in BM, polydipsia  Checks BP daily 135/85 - 170/100.   Blood sugars -  Range from 75 - 140 - Dr. Laney Pastor had decreased Metformin to a half pill last visit, patient blood sugar began to get up to 170s, she became nervous and started taking a whole pill again.  10/10/2015 - Presented HTN uncontrolled and with Hx Acute diastolic CHF, not asymptomatic, increased losartan from 25 to 152m daily.  Stable Type 2 DM with hyperglycemia, and long term insulin use, was given option of reducing metformin dose if her blood sugars remained below 110 in the AM.  Review of Systems All others negative except those listed in HPI.  Patient Active Problem List   Diagnosis Date Noted  . Iron deficiency anemia 11/24/2014  . Sickle cell trait ([facility] 11/08/2014  . Microalbuminuria 11/08/2014  . Chronic diastolic CHF (congestive heart failure), NYHA class 2 ([place] 08/23/2014  . Morbid obesity ([place] 08/23/2014  . Acute diastolic CHF (congestive  heart failure), NYHA class 3 ([place] 06/01/2014  . Hypoxia 05/31/2014  . Acute respiratory failure with hypoxia ([place] 05/31/2014  . Uncontrolled hypertension 05/31/2014  . Type 2 diabetes mellitus with hyperglycemia ([place] 03/30/2014  . Essential hypertension 03/30/2014   Current Outpatient Prescriptions on File Prior to Visit  Medication Sig Dispense Refill  . amLODipine (NORVASC) 10 MG tablet Take 1 tablet (10 mg total) by mouth daily. 90 tablet 0  . carvedilol (COREG) 25 MG tablet Take 1 tablet (25 mg total) by mouth 2 (two) times daily with a meal. Pt is overdue for an appt. Please call to schedule for further refills 30 tablet 0  . folic acid (FOLVITE) 1 MG tablet TAKE ONE TABLET BY MOUTH DAILY 30 tablet 0  . furosemide (LASIX) 40 MG tablet Take 1 tablet (40 mg total) by mouth daily. Pt is overdue for an appointment. Please call and schedule for further refills 15 tablet 0  . glucose monitoring kit (FREESTYLE) monitoring kit 1 each by Does not apply route as needed for other. 1 each 0  . insulin NPH-regular Human (NOVOLIN 70/30 RELION) (70-30) 100 UNIT/ML injection INJECT 0.15 MLS (15 UNITS TOTAL) INTO THE SKIN 2 TIMES DAILY WITH A MEAL PATIENT NEEDS OFFICE VISIT FOR MORE REFILLS" 2ND 10 mL 0  . Insulin Syringe-Needle U-100 (SAFETY-GLIDE 0.3CC SYR 29GX1/2) 29G X 1/2" 0.3 ML MISC Use as directed. 200 each 0  . losartan (COZAAR) 100 MG tablet Take 1 tablet (100 mg total) by mouth daily. 90 tablet 3  .  metFORMIN (GLUCOPHAGE) 1000 MG tablet Take 1 tablet (1,000 mg total) by mouth 2 (two) times daily with a meal. 180 tablet 1   No current facility-administered medications on file prior to visit.    No Known Allergies    Objective:   Physical Exam  Constitutional: She is oriented to person, place, and time. She appears well-developed and well-nourished.  Eyes: Conjunctivae are normal. Pupils are equal, round, and reactive to light.  Cardiovascular: Normal rate, regular rhythm, normal heart  sounds and intact distal pulses.   Pulmonary/Chest: Effort normal and breath sounds normal. No respiratory distress. She has no wheezes. She exhibits no tenderness.  Musculoskeletal:       Right hip: She exhibits tenderness. She exhibits normal range of motion, normal strength, no bony tenderness, no swelling and no crepitus.       Left hip: She exhibits normal range of motion, normal strength, no tenderness, no bony tenderness, no swelling and no crepitus.  Neurological: She is alert and oriented to person, place, and time.  Reflex Scores:      Patellar reflexes are 1+ on the right side and 1+ on the left side. Skin: Skin is warm and dry.  Psychiatric: She has a normal mood and affect. Her behavior is normal. Judgment and thought content normal.    Diabetic Foot Exam - Simple   Simple Foot Form  Diabetic Foot exam was performed with the following findings:  Yes 03/09/2016  6:48 PM  Visual Inspection  No deformities, no ulcerations, no other skin breakdown bilaterally:  Yes  Sensation Testing  Intact to touch and monofilament testing bilaterally:  Yes  Pulse Check  Posterior Tibialis and Dorsalis pulse intact bilaterally:  Yes  Comments         Assessment & Plan:  1. Uncontrolled hypertension Labs drawn, results pending.  Refilled one month supply of prescriptions.  Instructed patient to schedule an appointment with her cardiologist ASAP for additional evaluation and guidance in management of her hypertension. - POCT Microscopic Urinalysis ([facility]) - CBC with Differential/Platelet - Comprehensive metabolic panel - furosemide (LASIX) 40 MG tablet; Take 1 tablet (40 mg total) by mouth daily.  Dispense: 30 tablet; Refill: 0 - carvedilol (COREG) 25 MG tablet; Take 1 tablet (25 mg total) by mouth 2 (two) times daily with a meal.  Dispense: 60 tablet; Refill: 0 - amLODipine (NORVASC) 10 MG tablet; Take 1 tablet (10 mg total) by mouth daily.  Dispense: 30 tablet; Refill: 0  2. Type 2  diabetes mellitus with hyperglycemia, unspecified long term insulin use status (HCC) Stable. Labs pending.  Refilled metformin.  Will adjust management as necessary. - Hemoglobin A1c - POCT urinalysis dipstick - POCT Microscopic Urinalysis ([facility]) - Microalbumin, urine - metFORMIN (GLUCOPHAGE) 1000 MG tablet; Take 1 tablet (1,000 mg total) by mouth 2 (two) times daily with a meal.  Dispense: 180 tablet; Refill: 1  3. Screening for HIV (human immunodeficiency virus) Labs drawn, results pending. - HIV antibody  4. Hip pain, unspecified laterality Recommended patient connect with a personal trainer at her gym to help her with core strengthening and to develop an appropriate work out regimen that will decrease strain on her joints.  5. Hematuria Culture ordered and pending. If culture reveals no etiology then will refer patient to urology for evaluation. Patient told to return in 2 week for another urinalysis to identify if RBC are still present in urine.   - Urine culture  Patient to follow up in 2 weeks for re-evaluation  of hematuria.  Instructed if any questions or concerns arise in please call or return to clinic.  Delta Deshmukh P. Aniruddh Ciavarella, PA-S

## 2016-03-10 LAB — COMPREHENSIVE METABOLIC PANEL
ALT: 5 U/L — ABNORMAL LOW (ref 6–29)
AST: 8 U/L — AB (ref 10–30)
Albumin: 4.1 g/dL (ref 3.6–5.1)
Alkaline Phosphatase: 60 U/L (ref 33–115)
BILIRUBIN TOTAL: 0.4 mg/dL (ref 0.2–1.2)
BUN: 12 mg/dL (ref 7–25)
CALCIUM: 9 mg/dL (ref 8.6–10.2)
CHLORIDE: 105 mmol/L (ref 98–110)
CO2: 23 mmol/L (ref 20–31)
Creat: 0.96 mg/dL (ref 0.50–1.10)
GLUCOSE: 69 mg/dL (ref 65–99)
Potassium: 4 mmol/L (ref 3.5–5.3)
SODIUM: 140 mmol/L (ref 135–146)
Total Protein: 7.4 g/dL (ref 6.1–8.1)

## 2016-03-10 LAB — CBC WITH DIFFERENTIAL/PLATELET
BASOS PCT: 0 %
Basophils Absolute: 0 cells/uL (ref 0–200)
EOS PCT: 2 %
Eosinophils Absolute: 218 cells/uL (ref 15–500)
HEMATOCRIT: 33.6 % — AB (ref 35.0–45.0)
Hemoglobin: 11 g/dL — ABNORMAL LOW (ref 11.7–15.5)
LYMPHS PCT: 36 %
Lymphs Abs: 3924 cells/uL — ABNORMAL HIGH (ref 850–3900)
MCH: 22.2 pg — ABNORMAL LOW (ref 27.0–33.0)
MCHC: 32.7 g/dL (ref 32.0–36.0)
MCV: 67.9 fL — ABNORMAL LOW (ref 80.0–100.0)
Monocytes Absolute: 763 cells/uL (ref 200–950)
Monocytes Relative: 7 %
NEUTROS PCT: 55 %
Neutro Abs: 5995 cells/uL (ref 1500–7800)
Platelets: 234 10*3/uL (ref 140–400)
RBC: 4.95 MIL/uL (ref 3.80–5.10)
RDW: 19.1 % — AB (ref 11.0–15.0)
WBC: 10.9 10*3/uL — ABNORMAL HIGH (ref 3.8–10.8)

## 2016-03-10 LAB — HIV ANTIBODY (ROUTINE TESTING W REFLEX): HIV 1&2 Ab, 4th Generation: NONREACTIVE

## 2016-03-10 LAB — MICROALBUMIN, URINE: Microalb, Ur: 126.5 mg/dL

## 2016-03-11 ENCOUNTER — Other Ambulatory Visit: Payer: Self-pay | Admitting: Cardiology

## 2016-03-11 LAB — HEMOGLOBIN A1C
Hgb A1c MFr Bld: 5.4 % (ref <5.7)
Mean Plasma Glucose: 108 mg/dL

## 2016-03-11 LAB — URINE CULTURE
COLONY COUNT: NO GROWTH
Organism ID, Bacteria: NO GROWTH

## 2016-03-19 ENCOUNTER — Other Ambulatory Visit: Payer: Self-pay | Admitting: Family

## 2016-04-07 ENCOUNTER — Other Ambulatory Visit: Payer: Self-pay | Admitting: Physician Assistant

## 2016-04-07 DIAGNOSIS — I10 Essential (primary) hypertension: Secondary | ICD-10-CM

## 2016-04-09 ENCOUNTER — Telehealth: Payer: Self-pay

## 2016-04-09 NOTE — Telephone Encounter (Signed)
Patient stated she is completely out of her medication. Patient request for a refill of Furosemide 40 MG and Carvedilol 25 MG. [facility]. [phone].

## 2016-04-11 NOTE — Telephone Encounter (Signed)
The medications were sent in on 7/31

## 2016-04-24 ENCOUNTER — Other Ambulatory Visit: Payer: Self-pay

## 2016-04-24 ENCOUNTER — Telehealth: Payer: Self-pay | Admitting: Cardiology

## 2016-04-24 DIAGNOSIS — I10 Essential (primary) hypertension: Secondary | ICD-10-CM

## 2016-04-24 DIAGNOSIS — I5031 Acute diastolic (congestive) heart failure: Secondary | ICD-10-CM

## 2016-04-24 MED ORDER — CARVEDILOL 25 MG PO TABS: ORAL_TABLET | ORAL | 0 refills | Status: DC

## 2016-04-24 MED ORDER — FUROSEMIDE 40 MG PO TABS: ORAL_TABLET | ORAL | 0 refills | Status: DC

## 2016-04-24 MED ORDER — LOSARTAN POTASSIUM 100 MG PO TABS: 100.0000 mg | ORAL_TABLET | Freq: Every day | ORAL | 0 refills | Status: DC

## 2016-04-24 MED ORDER — FOLIC ACID 1 MG PO TABS: 1.0000 mg | ORAL_TABLET | Freq: Every day | ORAL | 0 refills | Status: DC

## 2016-04-24 NOTE — Telephone Encounter (Signed)
New message        *STAT* If patient is at the pharmacy, call can be transferred to refill team.   1. Which medications need to be refilled? (please list name of each medication and dose if known) carvedilol 25mg , folic acid 1mg , losartan 100mg , amlodipine 10mg , furosemide 40mg  2. Which pharmacy/location (including street and city if local pharmacy) is medication to be sent to? [facility] [facility] [place]. Do they need a 30 day or 90 day supply?  Until pts appt in oct

## 2016-05-02 ENCOUNTER — Other Ambulatory Visit: Payer: Self-pay

## 2016-05-02 DIAGNOSIS — I10 Essential (primary) hypertension: Secondary | ICD-10-CM

## 2016-05-02 MED ORDER — AMLODIPINE BESYLATE 10 MG PO TABS: 10.0000 mg | ORAL_TABLET | Freq: Every day | ORAL | 1 refills | Status: DC

## 2016-05-02 NOTE — Telephone Encounter (Signed)
Will give RFs of amlodipine as req'd until pt's cardio appt in Oct.

## 2016-06-18 ENCOUNTER — Encounter: Payer: Self-pay | Admitting: Cardiology

## 2016-06-25 ENCOUNTER — Other Ambulatory Visit: Payer: Self-pay | Admitting: Cardiology

## 2016-06-25 NOTE — Telephone Encounter (Signed)
Dr Delton SeeNelson to advise on pts folic acid refill request.  Pt to see Dr Delton SeeNelson in the clinic on 10/19 at 1130, for routine follow-up and to refill cardiac meds prescribed.

## 2016-06-28 ENCOUNTER — Ambulatory Visit (INDEPENDENT_AMBULATORY_CARE_PROVIDER_SITE_OTHER): Payer: BLUE CROSS/BLUE SHIELD | Admitting: Cardiology

## 2016-06-28 ENCOUNTER — Encounter: Payer: Self-pay | Admitting: Cardiology

## 2016-06-28 DIAGNOSIS — I10 Essential (primary) hypertension: Secondary | ICD-10-CM

## 2016-06-28 DIAGNOSIS — I5033 Acute on chronic diastolic (congestive) heart failure: Secondary | ICD-10-CM | POA: Diagnosis not present

## 2016-06-28 DIAGNOSIS — I110 Hypertensive heart disease with heart failure: Secondary | ICD-10-CM | POA: Diagnosis not present

## 2016-06-28 MED ORDER — FUROSEMIDE 40 MG PO TABS: ORAL_TABLET | ORAL | 3 refills | Status: DC

## 2016-06-28 MED ORDER — AMLODIPINE BESYLATE 10 MG PO TABS: 10.0000 mg | ORAL_TABLET | Freq: Every day | ORAL | 3 refills | Status: DC

## 2016-06-28 MED ORDER — CARVEDILOL 25 MG PO TABS: ORAL_TABLET | ORAL | 3 refills | Status: DC

## 2016-06-28 MED ORDER — LOSARTAN POTASSIUM 100 MG PO TABS: 100.0000 mg | ORAL_TABLET | Freq: Every day | ORAL | 3 refills | Status: DC

## 2016-06-28 NOTE — Patient Instructions (Signed)

## 2016-06-28 NOTE — Progress Notes (Signed)
Patient ID: Brittany Lopez, female   DOB: Oct 09, 1975, 40 y.o.   MRN: [id number]    Patient Name: Brittany Lopez Date of Encounter: 06/28/2016  Primary Care Provider:  Leandrew Koyanagi, MD Primary Cardiologist:  Ena Dawley  Problem List   Past Medical History:  Diagnosis Date  . Diabetes mellitus without complication ([place])   . Hypertension   . Obesity    Past Surgical History:  Procedure Laterality Date  . CESAREAN SECTION     Allergies  No Known Allergies  HPI  40 year old African American female with past medical history significant for hypertension, diabetes and morbid obesity admitted on 06/04/14 with diarrhea x 3 days and malignant hypertension. While in the ED, she began to have dyspnea and required nonrebreather. She was treated for acute on chronic diastolic HF in the setting of uncontrolled HTN and 2 L IV fluid resuscitation with diarrhea. Continued Lasix 40 mg po BID on discharge, Crea improving 1 -->1.2--> 1.4 --> 1.3, need aggressive BP control, start physical therapy.  06/17/14 - 1.post hospitalization visit, she is feeling whole lot better, no SOB, no orthopnea, PND, no chest pain. She is complaint with her meds. She has mild residual LE edema.   08/23/2014 -  The patient is coming after 2 months. She feels significantly better and feels that she can perform much more  Exercise , she goes to gym twice a week and is able to exercise on treadmill for half an hour. In between she is doing Zumba at home.  She is very compliant with her medication. She doesn't feel like she has any side effects from them. Recently her insulin dose was decreased as she was getting hypoglycemic episodes and confusion. She denies any shortness of breath at rest no orthopnea or proximal nocturnal dyspnea. She also denies any palpitations or syncope. She is compliant with her medicines and started to eat Mediterranean style diet.  12/01/2014 - patient is coming after 3 months, in the  meantime she felt very well she denies any chest pain or shortness of breath. She goes to the gym on a regular basis and lost 18 pounds since the last visit. She denies any orthopnea, paroxysmal nocturnal dyspnea, lower extremity edema. She carries a sickle cell trait and her most recent iron studies showed a very low iron and ferritin levels and she was initiated on IV iron supplements. This is being followed by her primary care physician. She has noticed that since her anemia worsen her blood pressure has been elevated. She denies any headaches or syncope.  12/21/2014 - patient is coming after 1 months, she feels better, she continues going to gym every other day and denies any significant dyspnea and no chest pain. She was started on losartan at the last visit for hypertension, she states that her blood pressure fluctuates as lows as 100-150 mmHg. She denies any dizziness or syncope with that. She has been followed by hematologist as she has sickle cell trait and was 3 mL living IV iron. The patient identified mass in her rest and also in her vagina and is going to follow with gynecology for further workup.  06/28/2016, the patient is coming after 18 months, she has been feeling well, she denies any lower extremity edema and orthopnea paroxysmal nocturnal dyspnea. She continues to take furosemide 40 mg daily. She is compliant with her medicines and doesn't have any side effects. She denies any palpitations or syncope. She is accompanied by her husband. Her blood pressure  has been controlled.   Home Medications  Prior to Admission medications   Medication Sig Start Date End Date Taking? Authorizing Provider  amLODipine (NORVASC) 10 MG tablet Take 1 tablet (10 mg total) by mouth daily. 06/04/14  Yes Modena Jansky, MD  carvedilol (COREG) 25 MG tablet Take 1 tablet (25 mg total) by mouth 2 (two) times daily with a meal. 06/04/14  Yes Modena Jansky, MD  furosemide (LASIX) 40 MG tablet Take 40 mg by  mouth daily. 06/04/14  Yes Modena Jansky, MD  insulin aspart protamine- aspart (NOVOLOG MIX 70/30) (70-30) 100 UNIT/ML injection Inject 15 Units into the skin 2 (two) times daily with a meal. May use Relion brand from [facility]. 06/04/14  Yes Modena Jansky, MD  metFORMIN (GLUCOPHAGE) 1000 MG tablet Take 1 tablet (1,000 mg total) by mouth 2 (two) times daily with a meal. 03/29/14  Yes Elby Beck, FNP  glucose monitoring kit (FREESTYLE) monitoring kit 1 each by Does not apply route as needed for other. 06/04/14   Modena Jansky, MD  Insulin Syringe-Needle U-100 (SAFETY-GLIDE 0.3CC SYR 29GX1/2) 29G X 1/2" 0.3 ML MISC Use as directed. 06/04/14   Modena Jansky, MD    Family History  Family History  Problem Relation Age of Onset  . Diabetes Mother   . Heart disease Mother   . Hypertension Mother   . Stroke Mother   . Sjogren's syndrome Mother     Social History  Social History   Social History  . Marital status: Married    Spouse name: N/A  . Number of children: N/A  . Years of education: N/A   Occupational History  . Not on file.   Social History Main Topics  . Smoking status: Never Smoker  . Smokeless tobacco: Never Used  . Alcohol use Yes     Comment: socially  . Drug use: No  . Sexual activity: Not on file   Other Topics Concern  . Not on file   Social History Narrative  . No narrative on file     Review of Systems, as per HPI, otherwise negative General:  No chills, fever, night sweats or weight changes.  Cardiovascular:  No chest pain, dyspnea on exertion, edema, orthopnea, palpitations, paroxysmal nocturnal dyspnea. Dermatological: No rash, lesions/masses Respiratory: No cough, dyspnea Urologic: No hematuria, dysuria Abdominal:   No nausea, vomiting, diarrhea, bright red blood per rectum, melena, or hematemesis Neurologic:  No visual changes, wkns, changes in mental status. All other systems reviewed and are otherwise negative except as noted  above.  Physical Exam  Blood pressure 128/90, pulse 84, height '5\' 3"'  (1.6 m), weight 251 lb (113.9 kg).  General: Pleasant, NAD Psych: Normal affect. Neuro: Alert and oriented X 3. Moves all extremities spontaneously. HEENT: Normal  Neck: Supple without bruits or JVD. Lungs:  Resp regular and unlabored, CTA. Heart: RRR no s3, s4, or murmurs. Abdomen: Soft, non-tender, non-distended, BS + x 4.  Extremities: No clubbing, cyanosis or edema. DP/PT/Radials 2+ and equal bilaterally.  Labs:  No results for input(s): CKTOTAL, CKMB, TROPONINI in the last 72 hours. Lab Results  Component Value Date   WBC 10.9 (H) 03/09/2016   HGB 11.0 (L) 03/09/2016   HCT 33.6 (L) 03/09/2016   MCV 67.9 (L) 03/09/2016   PLT 234 03/09/2016    Lab Results  Component Value Date   DDIMER 1.27 (H) 05/30/2014   Invalid input(s): POCBNP    Component Value Date/Time   NA  140 03/09/2016 1856   K 4.0 03/09/2016 1856   CL 105 03/09/2016 1856   CO2 23 03/09/2016 1856   GLUCOSE 69 03/09/2016 1856   BUN 12 03/09/2016 1856   CREATININE 0.96 03/09/2016 1856   CALCIUM 9.0 03/09/2016 1856   PROT 7.4 03/09/2016 1856   ALBUMIN 4.1 03/09/2016 1856   AST 8 (L) 03/09/2016 1856   ALT 5 (L) 03/09/2016 1856   ALKPHOS 60 03/09/2016 1856   BILITOT 0.4 03/09/2016 1856   GFRNONAA >60 09/11/2015 1333   GFRNONAA 69 11/06/2014 1636   GFRAA >60 09/11/2015 1333   GFRAA 80 11/06/2014 1636   Lab Results  Component Value Date   CHOL  05/24/2010    123        ATP III CLASSIFICATION:  <200     mg/dL   Desirable  200-239  mg/dL   Borderline High  >=240    mg/dL   High          HDL 32 (L) 05/24/2010   LDLCALC  05/24/2010    73        Total Cholesterol/HDL:CHD Risk Coronary Heart Disease Risk Table                     Men   Women  1/2 Average Risk   3.4   3.3  Average Risk       5.0   4.4  2 X Average Risk   9.6   7.1  3 X Average Risk  23.4   11.0        Use the calculated Patient Ratio above and the CHD Risk  Table to determine the patient's CHD Risk.        ATP III CLASSIFICATION (LDL):  <100     mg/dL   Optimal  100-129  mg/dL   Near or Above                    Optimal  130-159  mg/dL   Borderline  160-189  mg/dL   High  >190     mg/dL   Very High   TRIG 91 05/24/2010    Accessory Clinical Findings  echocardiogram   ECHO 05/31/2014 Left ventricle: The cavity size was normal. Wall thickness was increased in a pattern of mild LVH. Systolic function was normal. The estimated ejection fraction was in the range of 50% to 55%. Cannot exclude hypokinesis of the basalinferior myocardium. Doppler parameters are consistent with abnormal left ventricular relaxation (grade 1 diastolic dysfunction). - Aortic valve: Probably trileaflet. Appears to be an area of moderate calcification involving the noncoronary or left coronary cusp and adjacent annulus. Not well seen, cannot completely exclude a calcified vegetation or mass. This could be further visualized by TEE, if felt to be clinically appropriate. Cusp separation was normal. There was no significant regurgitation. - Left atrium: The atrium was at the upper limits of normal in size. - Right atrium: Central venous pressure (est): 3 mm Hg. - Tricuspid valve: There was trivial regurgitation. - Pulmonary arteries: Systolic pressure could not be accurately estimated. - Pericardium, extracardiac: There was no pericardial effusion.  Impressions:  - Limited images overall. Mild LVH with LVEF approximately 50-55%, cannot exclude inferior hypokinesis, grade 1 diastolic dysfunction. Appears to be an area of moderate calcification involving the noncoronary or left coronary cusp and adjacent annulus. Not well seen, cannot completely exclude a calcified vegetation or mass. This could be further visualized by TEE, if felt  to be clinically appropriate. Trivial tricuspid regurgitation, unable to assess PASP.  EKG performed today 06/28/2016 shows  normal sinus rhythm with nonspecific ST-T wave abnormalities and is unchanged from prior.    ASSESSMENT AND PLAN  40 year old [place] American female with past medical history significant for hypertension, diabetes and morbid obesity hospitalized on 06/04/14 for acute on chronic CHF secondary to malignant hypertension.   1. Acute on chronic diastolic HF  - in the setting of uncontrolled HTN and 2 L IV fluid resuscitation with diarrhea - now euvolemic, Crea improved, completely normal in June 2017, continue Lasix 40 mg PO daily.   2. Hypertensive heart disease with CHF - Blood pressure is now controlled, will continue the same regimen.  3. ?AV calcification  - Reviewed the echo image, does not believe she need TEE at this time. A-febrile, low suspicion for endocarditis. Calcified lesion make endocarditis unlikely too   4. Diabetes - improve control, HbA1c 10.3%-->8.1%, continues to loose weight  5. Morbid obesity - great weight loss, encourage to continue exercising, she has lost overall 35 pounds. She is encouraged to continue to do so.   6. SS trait - chronic microcytic anemia - on iv iron, followed by PCP, was recent hemoglobin 11.0.  Follow up in 1 year.  Signed, Dorothy Spark MD, [facility] 06/16/2014

## 2016-08-08 ENCOUNTER — Other Ambulatory Visit: Payer: Self-pay | Admitting: Physician Assistant

## 2016-08-08 DIAGNOSIS — E1165 Type 2 diabetes mellitus with hyperglycemia: Secondary | ICD-10-CM

## 2016-08-26 ENCOUNTER — Other Ambulatory Visit: Payer: Self-pay | Admitting: Cardiology

## 2016-09-25 ENCOUNTER — Encounter (HOSPITAL_COMMUNITY): Payer: Self-pay | Admitting: Emergency Medicine

## 2016-09-25 DIAGNOSIS — I10 Essential (primary) hypertension: Secondary | ICD-10-CM | POA: Diagnosis present

## 2016-09-25 DIAGNOSIS — Z5321 Procedure and treatment not carried out due to patient leaving prior to being seen by health care provider: Secondary | ICD-10-CM | POA: Diagnosis not present

## 2016-09-25 NOTE — ED Triage Notes (Addendum)
Pt reports HTN that she has not been able to regulate at home; pt has been on amolodipine, lasix, and lasartin for a year; feels lightheaded when she lays down; ambulatory

## 2016-09-26 ENCOUNTER — Emergency Department (HOSPITAL_COMMUNITY)
Admission: EM | Admit: 2016-09-26 | Discharge: 2016-09-26 | Disposition: A | Discharge status: Home / Self Care | Payer: BLUE CROSS/BLUE SHIELD | Attending: Emergency Medicine | Admitting: Emergency Medicine

## 2016-12-22 ENCOUNTER — Ambulatory Visit (INDEPENDENT_AMBULATORY_CARE_PROVIDER_SITE_OTHER): Payer: BLUE CROSS/BLUE SHIELD | Admitting: Physician Assistant

## 2016-12-22 VITALS — BP 160/80 | HR 80 | Temp 98.4°F | Resp 18 | Ht 63.0 in | Wt 264.6 lb

## 2016-12-22 DIAGNOSIS — I10 Essential (primary) hypertension: Secondary | ICD-10-CM

## 2016-12-22 DIAGNOSIS — E119 Type 2 diabetes mellitus without complications: Secondary | ICD-10-CM

## 2016-12-22 DIAGNOSIS — Z794 Long term (current) use of insulin: Secondary | ICD-10-CM | POA: Diagnosis not present

## 2016-12-22 DIAGNOSIS — I5032 Chronic diastolic (congestive) heart failure: Secondary | ICD-10-CM | POA: Diagnosis not present

## 2016-12-22 DIAGNOSIS — E1165 Type 2 diabetes mellitus with hyperglycemia: Secondary | ICD-10-CM | POA: Diagnosis not present

## 2016-12-22 MED ORDER — METFORMIN HCL 1000 MG PO TABS: ORAL_TABLET | ORAL | 0 refills | Status: DC

## 2016-12-22 NOTE — Progress Notes (Signed)
Brittany Lopez  MRN: [id number] DOB: [birth year]  PCP: No primary care provider on file.  Chief Complaint  Patient presents with  . Follow-up    DM and BP   . Medication Refill    all meds     Subjective:  Pt presents to clinic for medication refill and rechecks.  Diabetes -  Glucose at home - 80-90 fastings and after eating low 100s - highest 176 which is really high for her - uses novolin 70/30 15U bid that she gets from [facility] - she has never been on any other DM medications other than her metformin since the insulin initiation at an ER visit years ago with elevated glucose.  She has had good control of his A1C and she has been trying to make healthy lifestyle changes for weight loss and health.  Hypertension- see saw her cardiologist in 06/2016 BP checks at home - 160s/90s - average but also a big fluctuation in her home numbers - she has not had her monitor compared with in office readings - no symptoms of vision changes, chest pain and dizzy  During the fall - she had been not as active and under more stress with trying to finish her degree with some weight gain but in the last few months she has increased her exercise for health benefits.  She sees a gyn for her pap smears - thinks the last was about 2 years ago.  Declines her pneumo vaccines.  Review of Systems  Constitutional: Negative for chills and fever.  Respiratory: Negative for shortness of breath.   Cardiovascular: Negative for chest pain, palpitations and leg swelling.  Skin: Negative for wound.  Neurological: Negative for numbness and headaches.    Patient Active Problem List   Diagnosis Date Noted  . Iron deficiency anemia 11/24/2014  . Sickle cell trait ([place]) 11/08/2014  . Microalbuminuria 11/08/2014  . Chronic diastolic CHF (congestive heart failure), NYHA class 2 ([place]) 08/23/2014  . Morbid obesity ([place]) 08/23/2014  . Uncontrolled hypertension 05/31/2014  . Insulin dependent type 2 diabetes  mellitus, controlled ([place]) 03/30/2014  . Essential hypertension 03/30/2014    Current Outpatient Prescriptions on File Prior to Visit  Medication Sig Dispense Refill  . amLODipine (NORVASC) 10 MG tablet Take 1 tablet (10 mg total) by mouth daily. 90 tablet 3  . carvedilol (COREG) 25 MG tablet TAKE 1 TABLET(25 MG) BY MOUTH TWICE DAILY WITH A MEAL - 578 tablet 3  . folic acid (FOLVITE) 1 MG tablet Take 1 tablet (1 mg total) by mouth daily. 30 tablet 11  . furosemide (LASIX) 40 MG tablet TAKE 1 TABLET(40 MG) BY MOUTH DAILY - 90 tablet 3  . glucose monitoring kit (FREESTYLE) monitoring kit 1 each by Does not apply route as needed for other. 1 each 0  . insulin NPH-regular Human (NOVOLIN 70/30 RELION) (70-30) 100 UNIT/ML injection INJECT 0.15 MLS (15 UNITS TOTAL) INTO THE SKIN 2 TIMES DAILY WITH A MEAL PATIENT NEEDS OFFICE VISIT FOR MORE REFILLS" 2ND 10 mL 0  . Insulin Syringe-Needle U-100 (SAFETY-GLIDE 0.3CC SYR 29GX1/2) 29G X 1/2" 0.3 ML MISC Use as directed. 200 each 0  . losartan (COZAAR) 100 MG tablet Take 1 tablet (100 mg total) by mouth daily. 90 tablet 3   No current facility-administered medications on file prior to visit.     No Known Allergies  Pt patients past, family and social history were reviewed and updated.   Objective:  BP (!) 160/80   Pulse 80  Temp 98.4 F (36.9 C) (Oral)   Resp 18   Ht 5' 3" (1.6 m)   Wt 264 lb 9.6 oz (120 kg)   LMP 12/06/2016   SpO2 99%   BMI 46.87 kg/m   Physical Exam  Constitutional: She is oriented to person, place, and time and well-developed, well-nourished, and in no distress.  HENT:  Head: Normocephalic and atraumatic.  Right Ear: Hearing and external ear normal.  Left Ear: Hearing and external ear normal.  Eyes: Conjunctivae are normal.  Neck: Normal range of motion.  Cardiovascular: Normal rate, regular rhythm and normal heart sounds.   No murmur heard. Pulmonary/Chest: Effort normal and breath sounds normal.  Musculoskeletal:        Right lower leg: She exhibits no edema.       Left lower leg: She exhibits no edema.  Neurological: She is alert and oriented to person, place, and time. Gait normal.  Skin: Skin is warm and dry.  Psychiatric: Mood, memory, affect and judgment normal.  Vitals reviewed.   Assessment and Plan :   Problem List Items Addressed This Visit      Cardiovascular and Mediastinum   Uncontrolled hypertension   Essential hypertension - Primary    Uncontrolled.  Pt is having fluctuations at home.  She had a difficult time regulating with medications.  At her last cards appt her BP was controlled.  We discussed medications changes - she will f/u with cardiology in the next 2-4 weeks for repeat evaluation and possible change in medications.  She will continue to monitor her BP at home and seek immediate medical care if her BP is high with symptoms.      Chronic diastolic CHF (congestive heart failure), NYHA class 2 ([place])    F/u with cards as planned.        Endocrine   Insulin dependent type 2 diabetes mellitus, controlled ([place])    Pt is likely controlled with her home glucose readings.  Consider stop of insulin and addition of another agent to help with weight loss like trulicity.  Pt will research this.  Added a c peptide test to look for endogenous insulin production        Relevant Medications   metFORMIN (GLUCOPHAGE) 1000 MG tablet     Other   Morbid obesity ([place])    Continue health lifestyle choices.      Relevant Medications   metFORMIN (GLUCOPHAGE) 1000 MG tablet    Other Visit Diagnoses    Diabetes mellitus, type II, insulin dependent ([place])       Relevant Medications   metFORMIN (GLUCOPHAGE) 1000 MG tablet   Other Relevant Orders   C-peptide       Windell Hummingbird PA-C  [facility] 12/23/2016 9:20 AM

## 2016-12-22 NOTE — Patient Instructions (Signed)
     IF you received an x-ray today, you will receive an invoice from [facility]. Please contact [facility] at [phone] with questions or concerns regarding your invoice.   IF you received labwork today, you will receive an invoice from [facility]. Please contact [facility] at [phone] with questions or concerns regarding your invoice.   Our billing staff will not be able to assist you with questions regarding bills from these companies.  You will be contacted with the lab results as soon as they are available. The fastest way to get your results is to activate your My Chart account. Instructions are located on the last page of this paperwork. If you have not heard from us regarding the results in 2 weeks, please contact this office.     

## 2016-12-23 LAB — CMP14+EGFR
ALBUMIN: 4.2 g/dL (ref 3.5–5.5)
ALT: 7 IU/L (ref 0–32)
AST: 10 IU/L (ref 0–40)
Albumin/Globulin Ratio: 1.2 (ref 1.2–2.2)
Alkaline Phosphatase: 68 IU/L (ref 39–117)
BUN / CREAT RATIO: 15 (ref 9–23)
BUN: 17 mg/dL (ref 6–24)
Bilirubin Total: 0.2 mg/dL (ref 0.0–1.2)
CALCIUM: 9.3 mg/dL (ref 8.7–10.2)
CO2: 21 mmol/L (ref 18–29)
CREATININE: 1.14 mg/dL — AB (ref 0.57–1.00)
Chloride: 102 mmol/L (ref 96–106)
GFR, EST AFRICAN AMERICAN: 69 mL/min/{1.73_m2} (ref >59)
GFR, EST NON AFRICAN AMERICAN: 60 mL/min/{1.73_m2} (ref >59)
GLUCOSE: 91 mg/dL (ref 65–99)
Globulin, Total: 3.4 g/dL (ref 1.5–4.5)
Potassium: 4.2 mmol/L (ref 3.5–5.2)
Sodium: 140 mmol/L (ref 134–144)
TOTAL PROTEIN: 7.6 g/dL (ref 6.0–8.5)

## 2016-12-23 LAB — LIPID PANEL
CHOL/HDL RATIO: 3.7 ratio (ref 0.0–4.4)
Cholesterol, Total: 197 mg/dL (ref 100–199)
HDL: 53 mg/dL (ref >39)
LDL CALC: 126 mg/dL — AB (ref 0–99)
TRIGLYCERIDES: 89 mg/dL (ref 0–149)
VLDL CHOLESTEROL CAL: 18 mg/dL (ref 5–40)

## 2016-12-23 LAB — HEMOGLOBIN A1C
Est. average glucose Bld gHb Est-mCnc: 103 mg/dL
HEMOGLOBIN A1C: 5.2 % (ref 4.8–5.6)

## 2016-12-23 LAB — MICROALBUMIN, URINE: Microalbumin, Urine: 1217.7 ug/mL

## 2016-12-23 NOTE — Assessment & Plan Note (Signed)
Pt is likely controlled with her home glucose readings.  Consider stop of insulin and addition of another agent to help with weight loss like trulicity.  Pt will research this.  Added a c peptide test to look for endogenous insulin production

## 2016-12-23 NOTE — Assessment & Plan Note (Signed)
Uncontrolled.  Pt is having fluctuations at home.  She had a difficult time regulating with medications.  At her last cards appt her BP was controlled.  We discussed medications changes - she will f/u with cardiology in the next 2-4 weeks for repeat evaluation and possible change in medications.  She will continue to monitor her BP at home and seek immediate medical care if her BP is high with symptoms.

## 2016-12-23 NOTE — Assessment & Plan Note (Signed)
Continue health lifestyle choices.

## 2016-12-23 NOTE — Assessment & Plan Note (Signed)
F/u with cards as planned.

## 2016-12-26 LAB — C-PEPTIDE: C-Peptide: 3 ng/mL (ref 1.1–4.4)

## 2017-01-01 ENCOUNTER — Other Ambulatory Visit: Payer: Self-pay | Admitting: Cardiology

## 2017-01-01 ENCOUNTER — Other Ambulatory Visit: Payer: Self-pay | Admitting: Physician Assistant

## 2017-01-01 DIAGNOSIS — I10 Essential (primary) hypertension: Secondary | ICD-10-CM

## 2017-01-03 ENCOUNTER — Other Ambulatory Visit: Payer: Self-pay

## 2017-01-03 DIAGNOSIS — I10 Essential (primary) hypertension: Secondary | ICD-10-CM

## 2017-01-03 MED ORDER — LOSARTAN POTASSIUM 100 MG PO TABS: 100.0000 mg | ORAL_TABLET | Freq: Every day | ORAL | 0 refills | Status: DC

## 2017-01-03 NOTE — Progress Notes (Unsigned)
Fax req [facility] Losartan   To follow up with cardiologist 2-4 weeks from visit 12/22/2016 per note with Benny Lennert Sent #[phone] with -0- refills - previously rx by cardiologist

## 2017-01-05 ENCOUNTER — Encounter (HOSPITAL_COMMUNITY): Payer: Self-pay

## 2017-01-05 ENCOUNTER — Emergency Department (HOSPITAL_COMMUNITY)
Admission: EM | Admit: 2017-01-05 | Discharge: 2017-01-05 | Disposition: A | Discharge status: Home / Self Care | Payer: BLUE CROSS/BLUE SHIELD | Attending: Emergency Medicine | Admitting: Emergency Medicine

## 2017-01-05 DIAGNOSIS — E119 Type 2 diabetes mellitus without complications: Secondary | ICD-10-CM | POA: Diagnosis not present

## 2017-01-05 DIAGNOSIS — Z79899 Other long term (current) drug therapy: Secondary | ICD-10-CM | POA: Diagnosis not present

## 2017-01-05 DIAGNOSIS — I5032 Chronic diastolic (congestive) heart failure: Secondary | ICD-10-CM | POA: Insufficient documentation

## 2017-01-05 DIAGNOSIS — I10 Essential (primary) hypertension: Secondary | ICD-10-CM

## 2017-01-05 DIAGNOSIS — Z794 Long term (current) use of insulin: Secondary | ICD-10-CM | POA: Insufficient documentation

## 2017-01-05 DIAGNOSIS — I110 Hypertensive heart disease with heart failure: Secondary | ICD-10-CM | POA: Insufficient documentation

## 2017-01-05 LAB — I-STAT CHEM 8, ED
BUN: 12 mg/dL (ref 6–20)
Calcium, Ion: 1.18 mmol/L (ref 1.15–1.40)
Chloride: 104 mmol/L (ref 101–111)
Creatinine, Ser: 1.2 mg/dL — ABNORMAL HIGH (ref 0.44–1.00)
GLUCOSE: 99 mg/dL (ref 65–99)
HEMATOCRIT: 27 % — AB (ref 36.0–46.0)
HEMOGLOBIN: 9.2 g/dL — AB (ref 12.0–15.0)
POTASSIUM: 3.3 mmol/L — AB (ref 3.5–5.1)
Sodium: 140 mmol/L (ref 135–145)
TCO2: 25 mmol/L (ref 0–100)

## 2017-01-05 NOTE — ED Provider Notes (Signed)
[facility] DEPT Provider Note   CSN: [id number] Arrival date & time: 01/05/17  1649     History   Chief Complaint Chief Complaint  Patient presents with  . Hypertension    HPI Brittany Lopez is a 41 y.o. female.  Patient complains her blood pressure is elevated. She is on 3 blood pressure medicines but she feels fine.   The history is provided by the patient. No language interpreter was used.  Hypertension  This is a chronic problem. The current episode started more than 2 days ago. The problem occurs constantly. The problem has not changed since onset.Pertinent negatives include no chest pain, no abdominal pain and no headaches. Nothing aggravates the symptoms. Nothing relieves the symptoms.    Past Medical History:  Diagnosis Date  . Diabetes mellitus without complication ([place])   . Hypertension   . Obesity     Patient Active Problem List   Diagnosis Date Noted  . Iron deficiency anemia 11/24/2014  . Sickle cell trait ([place]) 11/08/2014  . Microalbuminuria 11/08/2014  . Chronic diastolic CHF (congestive heart failure), NYHA class 2 ([place]) 08/23/2014  . Morbid obesity ([place]) 08/23/2014  . Uncontrolled hypertension 05/31/2014  . Insulin dependent type 2 diabetes mellitus, controlled ([place]) 03/30/2014  . Essential hypertension 03/30/2014    Past Surgical History:  Procedure Laterality Date  . CESAREAN SECTION      OB History    No data available       Home Medications    Prior to Admission medications   Medication Sig Start Date End Date Taking? Authorizing Provider  amLODipine (NORVASC) 10 MG tablet Take 1 tablet (10 mg total) by mouth daily. 06/28/16  Yes Dorothy Spark, MD  carvedilol (COREG) 25 MG tablet TAKE 1 TABLET(25 MG) BY MOUTH TWICE DAILY WITH A MEAL - 06/28/16  Yes Dorothy Spark, MD  folic acid (FOLVITE) 1 MG tablet Take 1 tablet (1 mg total) by mouth daily. 08/28/16  Yes Dorothy Spark, MD  furosemide (LASIX) 40 MG tablet TAKE 1  TABLET(40 MG) BY MOUTH DAILY - 06/28/16  Yes Dorothy Spark, MD  glucose monitoring kit (FREESTYLE) monitoring kit 1 each by Does not apply route as needed for other. 06/04/14  Yes Modena Jansky, MD  insulin NPH-regular Human (NOVOLIN 70/30 RELION) (70-30) 100 UNIT/ML injection INJECT 0.15 MLS (15 UNITS TOTAL) INTO THE SKIN 2 TIMES DAILY WITH A MEAL PATIENT NEEDS OFFICE VISIT FOR MORE REFILLS" 2ND 02/23/15  Yes Mancel Bale, PA-C  Insulin Syringe-Needle U-100 (SAFETY-GLIDE 0.3CC SYR 29GX1/2) 29G X 1/2" 0.3 ML MISC Use as directed. 06/04/14  Yes Modena Jansky, MD  losartan (COZAAR) 100 MG tablet Take 1 tablet (100 mg total) by mouth daily. Pt to follow up with cardiologist by May 2018 for possible med changes 01/03/17  Yes Sarah Alleen Borne, PA-C  metFORMIN (GLUCOPHAGE) 1000 MG tablet TAKE 1 TABLET(1000 MG) BY MOUTH TWICE DAILY WITH A MEAL 12/22/16  Yes Mancel Bale, PA-C    Family History Family History  Problem Relation Age of Onset  . Diabetes Mother   . Heart disease Mother   . Hypertension Mother   . Stroke Mother   . Sjogren's syndrome Mother     Social History Social History  Substance Use Topics  . Smoking status: Never Smoker  . Smokeless tobacco: Never Used  . Alcohol use Yes     Comment: socially     Allergies   Patient has no known allergies.  Review of Systems Review of Systems  Constitutional: Negative for appetite change and fatigue.  HENT: Negative for congestion, ear discharge and sinus pressure.   Eyes: Negative for discharge.  Respiratory: Negative for cough.   Cardiovascular: Negative for chest pain.  Gastrointestinal: Negative for abdominal pain and diarrhea.  Genitourinary: Negative for frequency and hematuria.  Musculoskeletal: Negative for back pain.  Skin: Negative for rash.  Neurological: Negative for seizures and headaches.  Psychiatric/Behavioral: Negative for hallucinations.     Physical Exam Updated Vital Signs BP (!) 186/92 (BP  Location: Right Arm)   Pulse 77   Temp 98.1 F (36.7 C) (Oral)   Resp 18   LMP 12/06/2016 (Exact Date)   SpO2 99%   Physical Exam  Constitutional: She is oriented to person, place, and time. She appears well-developed.  HENT:  Head: Normocephalic.  Eyes: Conjunctivae and EOM are normal. No scleral icterus.  Neck: Neck supple. No thyromegaly present.  Cardiovascular: Normal rate and regular rhythm.  Exam reveals no gallop and no friction rub.   No murmur heard. Pulmonary/Chest: No stridor. She has no wheezes. She has no rales. She exhibits no tenderness.  Abdominal: She exhibits no distension. There is no tenderness. There is no rebound.  Musculoskeletal: Normal range of motion. She exhibits no edema.  Lymphadenopathy:    She has no cervical adenopathy.  Neurological: She is oriented to person, place, and time. She exhibits normal muscle tone. Coordination normal.  Skin: No rash noted. No erythema.  Psychiatric: She has a normal mood and affect. Her behavior is normal.     ED Treatments / Results  Labs (all labs ordered are listed, but only abnormal results are displayed) Labs Reviewed  I-STAT CHEM 8, ED - Abnormal; Notable for the following:       Result Value   Potassium 3.3 (*)    Creatinine, Ser 1.20 (*)    Hemoglobin 9.2 (*)    HCT 27.0 (*)    All other components within normal limits    EKG  EKG Interpretation None       Radiology No results found.  Procedures Procedures (including critical care time)  Medications Ordered in ED Medications - No data to display   Initial Impression / Assessment and Plan / ED Course  I have reviewed the triage vital signs and the nursing notes.  Pertinent labs & imaging results that were available during my care of the patient were reviewed by me and considered in my medical decision making (see chart for details).     Hypertension poorly controlled and anemia. Patient is on 3 blood pressure medicines. She will  follow-up with her PCP for further care  Final Clinical Impressions(s) / ED Diagnoses   Final diagnoses:  Essential hypertension    New Prescriptions New Prescriptions   No medications on file     Milton Ferguson, MD 01/05/17 2146

## 2017-01-05 NOTE — ED Triage Notes (Signed)
She is here d/t her b/p being persistently high. She states she feels well and that her diabetes is in good control with recent hbg A1c of 5.2.

## 2017-01-05 NOTE — Discharge Instructions (Signed)
Follow-up with your doctor this week for blood pressure check. Also for check on anemia

## 2017-01-08 ENCOUNTER — Ambulatory Visit (INDEPENDENT_AMBULATORY_CARE_PROVIDER_SITE_OTHER): Payer: BLUE CROSS/BLUE SHIELD | Admitting: Physician Assistant

## 2017-01-08 ENCOUNTER — Encounter: Payer: Self-pay | Admitting: Physician Assistant

## 2017-01-08 VITALS — BP 180/96 | HR 79 | Ht 63.0 in | Wt 263.8 lb

## 2017-01-08 DIAGNOSIS — I110 Hypertensive heart disease with heart failure: Secondary | ICD-10-CM | POA: Diagnosis not present

## 2017-01-08 DIAGNOSIS — E785 Hyperlipidemia, unspecified: Secondary | ICD-10-CM

## 2017-01-08 DIAGNOSIS — I10 Essential (primary) hypertension: Secondary | ICD-10-CM | POA: Diagnosis not present

## 2017-01-08 DIAGNOSIS — I5032 Chronic diastolic (congestive) heart failure: Secondary | ICD-10-CM | POA: Diagnosis not present

## 2017-01-08 DIAGNOSIS — N182 Chronic kidney disease, stage 2 (mild): Secondary | ICD-10-CM | POA: Diagnosis not present

## 2017-01-08 HISTORY — DX: Hypertensive heart disease with heart failure: I11.0

## 2017-01-08 MED ORDER — SPIRONOLACTONE 25 MG PO TABS: 25.0000 mg | ORAL_TABLET | Freq: Every day | ORAL | 1 refills | Status: DC

## 2017-01-08 NOTE — Patient Instructions (Addendum)
Medication Instructions:  Your physician has recommended you make the following change in your medication: Start SPIRONLACTONE 25 MG  Labwork: Your physician recommends that you return for lab work today BMET, URINALYSIS, TSH  Testing/Procedures: Your physician has recommended that you have a sleep study. This test records several body functions during sleep, including: brain activity, eye movement, oxygen and carbon dioxide blood levels, heart rate and rhythm, breathing rate and rhythm, the flow of air through your mouth and nose, snoring, body muscle movements, and chest and belly movement.    Follow-Up: Your physician recommends that you schedule a follow-up appointment in: 1 week with pharmacist for blood pressure visit.   Any Other Special Instructions Will Be Listed Below (If Applicable).  Limit fluid intake to 64 ounces daily.   If you need a refill on your cardiac medications before your next appointment, please call your pharmacy.

## 2017-01-08 NOTE — Progress Notes (Signed)
Cardiology Office Note    Date:  01/08/2017  ID:  Brittany Lopez, DOB [birth year], MRN [id number] PCP:  Pcp Not In System  Cardiologist:  Dr. Meda Coffee   Chief Complaint: f/u BP  History of Present Illness:  Brittany Lopez is a 41 y.o. female with history of morbid obesity, HTN, diabetes, chronic diastolic CHF, hypertensive heart disease, CKD, sickle cell trait, chronic microcytic anemia (IV iron) who presents to f/u BP. In 2015 she was admitted with diarrhea and malignant HTN as well as respiratory distress requiring NRB. She was treated for acute on chronic diastolic HF in the setting of uncontrolled HTN and 2 L IV fluid resuscitation with diarrhea. 2D Echo 2015 showed mild LVH, EF 50-55%, cannot exclude HK of basal inferior myocardium, grade 1 DD, aortic valve calcification (Dr. Meda Coffee did not feel TEE was indicated as no signs of endocarditis). Most recent labs showed K 3.3 and Cr 1.20 (4/28), A1C 5.2, LDL 126 (12/2016), Hgb 11 (02/2016).   She presents for follow-up today with her daughter. She recently saw Windell Hummingbird PA-C 12/22/16 at [facility] at which time BP was 160/80. She was asked to follow BP closely and f/u with cardiology. In the interim she was in the ED 4/28 with persistently high blood pressure. She is here today to further discuss. BP today is 180/96 with recheck BP 182/90 in opposite arm. She is discouraged because she has been working hard at diet, exercise, losing weight and taking all of her medications faithfully. She reports a history of severe snoring when she was heavier but states this has since improved. Despite her elevated blood pressure she is totally asymptomatic without any chest pain, SOB, edema, syncope. She drinks 2 to 3 forty-eight ounce jugs of water a day as she was previously instructed to increase fluid intake for her mildly elevated creatinine.   Past Medical History:  Diagnosis Date  . Chronic diastolic CHF (congestive heart failure) ([place])   .  CKD (chronic kidney disease), stage II   . Diabetes mellitus without complication ([place])   . Hyperlipidemia   . Hypertension   . Hypertensive heart disease with CHF (congestive heart failure) ([place]) 01/08/2017  . Microcytic anemia   . Morbid obesity ([place])   . Sickle cell trait [facility])     Past Surgical History:  Procedure Laterality Date  . CESAREAN SECTION      Current Medications: Current Outpatient Prescriptions  Medication Sig Dispense Refill  . amLODipine (NORVASC) 10 MG tablet Take 1 tablet (10 mg total) by mouth daily. 90 tablet 3  . carvedilol (COREG) 25 MG tablet TAKE 1 TABLET(25 MG) BY MOUTH TWICE DAILY WITH A MEAL - 809 tablet 3  . folic acid (FOLVITE) 1 MG tablet Take 1 tablet (1 mg total) by mouth daily. 30 tablet 11  . furosemide (LASIX) 40 MG tablet TAKE 1 TABLET(40 MG) BY MOUTH DAILY - 90 tablet 3  . glucose monitoring kit (FREESTYLE) monitoring kit 1 each by Does not apply route as needed for other. 1 each 0  . insulin NPH-regular Human (NOVOLIN 70/30 RELION) (70-30) 100 UNIT/ML injection INJECT 0.15 MLS (15 UNITS TOTAL) INTO THE SKIN 2 TIMES DAILY WITH A MEAL PATIENT NEEDS OFFICE VISIT FOR MORE REFILLS" 2ND 10 mL 0  . Insulin Syringe-Needle U-100 (SAFETY-GLIDE 0.3CC SYR 29GX1/2) 29G X 1/2" 0.3 ML MISC Use as directed. 200 each 0  . losartan (COZAAR) 100 MG tablet Take 1 tablet (100 mg total) by mouth daily.  Pt to follow up with cardiologist by May 2018 for possible med changes 30 tablet 0  . metFORMIN (GLUCOPHAGE) 1000 MG tablet TAKE 1 TABLET(1000 MG) BY MOUTH TWICE DAILY WITH A MEAL 180 tablet 0   No current facility-administered medications for this visit.      Allergies:   Patient has no known allergies.   Social History   Social History  . Marital status: Married    Spouse name: N/A  . Number of children: N/A  . Years of education: N/A   Social History Main Topics  . Smoking status: Never Smoker  . Smokeless tobacco: Never Used  . Alcohol use Yes      Comment: socially  . Drug use: No  . Sexual activity: Not Asked   Other Topics Concern  . None   Social History Narrative  . None     Family History:  Family History  Problem Relation Age of Onset  . Diabetes Mother   . Heart disease Mother   . Hypertension Mother   . Stroke Mother   . Sjogren's syndrome Mother      ROS:   Please see the history of present illness.  All other systems are reviewed and otherwise negative.    PHYSICAL EXAM:   VS:  BP (!) 180/96   Pulse 79   Ht '5\' 3"'  (1.6 m)   Wt 263 lb 12 oz (119.6 kg)   SpO2 97%   BMI 46.72 kg/m   BMI: Body mass index is 46.72 kg/m. GEN: Well nourished, well developed morbidly obese AAF, in no acute distress  HEENT: normocephalic, atraumatic Neck: no JVD, carotid bruits, or masses Cardiac: RRR; no murmurs, rubs, or gallops, no edema  Respiratory:  clear to auscultation bilaterally, normal work of breathing GI: soft, nontender, nondistended, + BS MS: no deformity or atrophy  Skin: warm and dry, no rash Neuro:  Alert and Oriented x 3, Strength and sensation are intact, follows commands Psych: euthymic mood, full affect  Wt Readings from Last 3 Encounters:  01/08/17 263 lb 12 oz (119.6 kg)  12/22/16 264 lb 9.6 oz (120 kg)  09/25/16 271 lb (122.9 kg)      Studies/Labs Reviewed:   EKG:  EKG was not ordered today  Recent Labs: 03/09/2016: Platelets 234 12/22/2016: ALT 7 01/05/2017: BUN 12; Creatinine, Ser 1.20; Hemoglobin 9.2; Potassium 3.3; Sodium 140   Lipid Panel    Component Value Date/Time   CHOL 197 12/22/2016 1307   TRIG 89 12/22/2016 1307   HDL 53 12/22/2016 1307   CHOLHDL 3.7 12/22/2016 1307   CHOLHDL 3.8 05/24/2010 0440   VLDL 18 05/24/2010 0440   LDLCALC 126 (H) 12/22/2016 1307    Additional studies/ records that were reviewed today include: Summarized above    ASSESSMENT & PLAN:   1. Uncontrolled HTN - blood pressure remains persistently elevated despite max doses of amlodipine,  carvedilol, losartan, and a maintenance dose of Lasix. This is suspicious for secondary etiology. Given her DM, would recommend to check UA today and if significant proteinuria persists, would recommend 24-hour urine protein collection to evaluate for nephrotic syndrome. Her body habitus and family history of OSA (aunt) also puts her at risk for sleep apnea being a culprit, so will obtain sleep study. Recent ED blood work showed a K of 3.3 but with regards to workup for hyperaldosteronism, her chronic ARB use would skew renin/aldosterone results if drawn. Will recheck BMET today along with screening TSH. Will empirically start spironolactone 33m  daily and have her follow up in 1 week with pharmD for BP recheck. If above workup is unremarkable could consider renal artery duplex. Of note, she does report drinking 96-144oz of fluid per day as she was previously instructed to increase fluid intake due to her kidney disease. Given that she is on diuretic therapy for her CHF, would recommend she scale this back to no more than 64oz per day. 2. Chronic diastolic CHF - see above. She reports compliance with sodium restriction. We discussed fluid restriction. Appears euvolemic, but BP control will be mainstay of keeping her there. 3. Morbid obesity - encouraged her to remain active and not get discouraged. She's doing a great job with lifestyle modifications. 4. CKD stage II - f/u BMET today. 5. Hyperlipidemia - recent LDL was elevated - this is followed by primary care. Given her diabetes, would consider at least moderate intensity statin initiation for primary prevention. Primary care has is giving her 3 months of continued lifestyle modifications.  Disposition: F/u with HTN clinic in 1 week.  Medication Adjustments/Labs and Tests Ordered: Current medicines are reviewed at length with the patient today.  Concerns regarding medicines are outlined above. Medication changes, Labs and Tests ordered today are  summarized above and listed in the Patient Instructions accessible in Encounters.   Raechel Ache PA-C  01/08/2017 2:41 PM    [address], [place], [place]  [id number] Phone: [phone]; Fax: [phone]

## 2017-01-09 LAB — BASIC METABOLIC PANEL
BUN / CREAT RATIO: 14 (ref 9–23)
BUN: 16 mg/dL (ref 6–24)
CO2: 23 mmol/L (ref 18–29)
Calcium: 9.3 mg/dL (ref 8.7–10.2)
Chloride: 102 mmol/L (ref 96–106)
Creatinine, Ser: 1.11 mg/dL — ABNORMAL HIGH (ref 0.57–1.00)
GFR calc Af Amer: 71 mL/min/{1.73_m2} (ref >59)
GFR, EST NON AFRICAN AMERICAN: 62 mL/min/{1.73_m2} (ref >59)
GLUCOSE: 77 mg/dL (ref 65–99)
Potassium: 4.2 mmol/L (ref 3.5–5.2)
Sodium: 142 mmol/L (ref 134–144)

## 2017-01-09 LAB — URINALYSIS
Bilirubin, UA: NEGATIVE
Glucose, UA: NEGATIVE
KETONES UA: NEGATIVE
Leukocytes, UA: NEGATIVE
NITRITE UA: NEGATIVE
RBC, UA: NEGATIVE
SPEC GRAV UA: 1.007 (ref 1.005–1.030)
UUROB: 0.2 mg/dL (ref 0.2–1.0)
pH, UA: 5 (ref 5.0–7.5)

## 2017-01-09 LAB — TSH: TSH: 3.09 u[IU]/mL (ref 0.450–4.500)

## 2017-01-11 ENCOUNTER — Telehealth: Payer: Self-pay | Admitting: *Deleted

## 2017-01-11 NOTE — Telephone Encounter (Addendum)
Patient notified of sleep study scheduled for Sunday March 17 2017. Patient understands she will receive a letter in the mail in a week or so. Patient understand to call if her letter does not reach her in a timely manner. Patient agrees with treatment plan and thanked me.

## 2017-01-15 ENCOUNTER — Encounter: Payer: Self-pay | Admitting: Pharmacist

## 2017-01-15 ENCOUNTER — Ambulatory Visit (INDEPENDENT_AMBULATORY_CARE_PROVIDER_SITE_OTHER): Payer: BLUE CROSS/BLUE SHIELD | Admitting: Pharmacist

## 2017-01-15 VITALS — BP 164/90 | HR 75

## 2017-01-15 DIAGNOSIS — I10 Essential (primary) hypertension: Secondary | ICD-10-CM

## 2017-01-15 MED ORDER — SPIRONOLACTONE 50 MG PO TABS: 50.0000 mg | ORAL_TABLET | Freq: Every day | ORAL | 1 refills | Status: DC

## 2017-01-15 NOTE — Patient Instructions (Signed)
Return for a follow up appointment in 2 weeks  Check your blood pressure at home daily (if able) and keep record of the readings.  Take your BP meds as follows: CONTINUE Losartan 100mg  daily, Furosemide 40mg  daily, Carvedilol 25mg  twice daily, Amlodipine 10mg  daily   INCREASE spironolactone to 50mg  daily (you may take 2 tablets of your current supply until you run out then pick up higher strength from pharmacy and take 1 tablet daily)  Bring all of your meds, your BP cuff and your record of home blood pressures to your next appointment.  Exercise as you're able, try to walk approximately 30 minutes per day.  Keep salt intake to a minimum, especially watch canned and prepared boxed foods.  Eat more fresh fruits and vegetables and fewer canned items.  Avoid eating in fast food restaurants.    HOW TO TAKE YOUR BLOOD PRESSURE: . Rest 5 minutes before taking your blood pressure. .  Don't smoke or drink caffeinated beverages for at least 30 minutes before. . Take your blood pressure before (not after) you eat. . Sit comfortably with your back supported and both feet on the floor (don't cross your legs). . Elevate your arm to heart level on a table or a desk. . Use the proper sized cuff. It should fit smoothly and snugly around your bare upper arm. There should be enough room to slip a fingertip under the cuff. The bottom edge of the cuff should be 1 inch above the crease of the elbow. . Ideally, take 3 measurements at one sitting and record the average.

## 2017-01-15 NOTE — Progress Notes (Signed)
Patient ID: Brittany Lopez                 DOB: 05/26/1976                      MRN: [id number]     HPI: Brittany Lopez is a 41 y.o. female patient of Dr. Meda Coffee, referred by Melina Copa, PA who presents today for hypertension evaluation. PMH includes morbid obesity, HTN, diabetes, chronic diastolic CHF, hypertensive heart disease, CKD, sickle cell trait, chronic microcytic anemia (IV iron). She is pending sleep study. BMET and TSH returned WNL. She has a history of difficult to control pressures. At her most recent OV she was started on spironolactone 41m daily.   She presents today stating she has been doing well with spironolactone. She denies any dizziness with medication. She states that her pressures were initially lower 130s/80s the first day or so after starting medication and then have trended back up in the last day or two. She has been taking spiro for only 1 week now. She endorses checking her pressure multiple times a day and her husband has told her she needs to stop checking so frequently as she is stressing herself out.   She also reports today that the veins in/near her knee tend to swell and bulge when her pressure is higher. She notices this more after work outs but wonders if this is related to her pressure being high. She googled it and found that it could be a clot. The area is not warm to the touch or red. She does have some baseline swelling after work outs due to a previous injury, but this swelling has not increased at all.   Current HTN meds:  Losartan 107mdaily in the morning Furosemide 4028maily Carvedilol 43m3mD  Amlodipine 10mg50mly in the morning Spironolactone 43mg 57my in the evening  BP goal: <130/80  Family History: Mother with heart disease, HTN, stroke, DM.   Social History: Denies tobacco products. Social alcohol use.   Diet: Most meals prepared from home. Avoids salt uses substitutes like Mrs.DASH. No coffee, rarely drinks tea. Usually  drinks seltzer water.   Exercise: Goes to gym for 1 hour doing cardio (treadmill and circuit room).   Home BP readings:  Arm cuff - 130/80 - 140/90 now trending 170s/90s.    Wt Readings from Last 3 Encounters:  01/08/17 263 lb 12 oz (119.6 kg)  12/22/16 264 lb 9.6 oz (120 kg)  09/25/16 271 lb (122.9 kg)   BP Readings from Last 3 Encounters:  01/15/17 (!) 164/90  01/08/17 (!) 180/96  01/05/17 (!) 186/92   Pulse Readings from Last 3 Encounters:  01/15/17 75  01/08/17 79  01/05/17 77    Renal function: Estimated Creatinine Clearance: 83.5 mL/min (A) (by C-G formula based on SCr of 1.11 mg/dL (H)).  Past Medical History:  Diagnosis Date  . Chronic diastolic CHF (congestive heart failure) (HCC)  [place] (chronic kidney disease), stage II   . Diabetes mellitus without complication (HCC)  [place]   . Hypertension   . Hypertensive heart disease with CHF (congestive heart failure) (HCC) 5[place]2018  . Microcytic anemia   . Morbid obesity (HCC)  [place]ickle cell trait (HCC) [facility] Prescriptions on [facility] Prior to Visit  Medication Sig Dispense Refill  . amLODipine (NORVASC) 10 MG tablet Take 1 tablet (10 mg total) by mouth daily. 90 tablet 3  .  carvedilol (COREG) 25 MG tablet TAKE 1 TABLET(25 MG) BY MOUTH TWICE DAILY WITH A MEAL - 532 tablet 3  . folic acid (FOLVITE) 1 MG tablet Take 1 tablet (1 mg total) by mouth daily. 30 tablet 11  . furosemide (LASIX) 40 MG tablet TAKE 1 TABLET(40 MG) BY MOUTH DAILY - 90 tablet 3  . glucose monitoring kit (FREESTYLE) monitoring kit 1 each by Does not apply route as needed for other. 1 each 0  . insulin NPH-regular Human (NOVOLIN 70/30 RELION) (70-30) 100 UNIT/ML injection INJECT 0.15 MLS (15 UNITS TOTAL) INTO THE SKIN 2 TIMES DAILY WITH A MEAL PATIENT NEEDS OFFICE VISIT FOR MORE REFILLS" 2ND 10 mL 0  . Insulin Syringe-Needle U-100 (SAFETY-GLIDE 0.3CC SYR 29GX1/2) 29G X 1/2" 0.3 ML MISC Use as directed. 200 each 0  .  losartan (COZAAR) 100 MG tablet Take 1 tablet (100 mg total) by mouth daily. Pt to follow up with cardiologist by May 2018 for possible med changes 30 tablet 0  . metFORMIN (GLUCOPHAGE) 1000 MG tablet TAKE 1 TABLET(1000 MG) BY MOUTH TWICE DAILY WITH A MEAL 180 tablet 0   No current facility-administered medications on file prior to visit.     No Known Allergies  Blood pressure (!) 164/90, pulse 75.   Assessment/Plan: Hypertension: BMET today with start of spironolactone. BP remains elevated today. Pending BMET results will increase spironolactone to 15m daily. Advised to monitor pressure only twice daily about 2 hours after medications. Continue all other medications as prescribed. If additional pressure control required at follow up could consider change to more potent ARB. Encouraged continued exercise and weight loss. Follow up BMET and HTN visit in 2 weeks.    Thank you, KLelan Pons APatterson Hammersmith PCol[facility]idge[facility]  01/15/2017 4:45 PM   BMET returned WNL - proceed with above plan.

## 2017-01-16 LAB — BASIC METABOLIC PANEL
BUN/Creatinine Ratio: 11 (ref 9–23)
BUN: 13 mg/dL (ref 6–24)
CO2: 22 mmol/L (ref 18–29)
Calcium: 9.5 mg/dL (ref 8.7–10.2)
Chloride: 103 mmol/L (ref 96–106)
Creatinine, Ser: 1.2 mg/dL — ABNORMAL HIGH (ref 0.57–1.00)
GFR calc Af Amer: 65 mL/min/{1.73_m2} (ref >59)
GFR calc non Af Amer: 56 mL/min/{1.73_m2} — ABNORMAL LOW (ref >59)
Glucose: 81 mg/dL (ref 65–99)
Potassium: 4.3 mmol/L (ref 3.5–5.2)
Sodium: 141 mmol/L (ref 134–144)

## 2017-01-29 ENCOUNTER — Ambulatory Visit (INDEPENDENT_AMBULATORY_CARE_PROVIDER_SITE_OTHER): Payer: BLUE CROSS/BLUE SHIELD | Admitting: Pharmacist

## 2017-01-29 ENCOUNTER — Encounter: Payer: Self-pay | Admitting: Pharmacist

## 2017-01-29 VITALS — BP 146/80 | HR 79

## 2017-01-29 DIAGNOSIS — I10 Essential (primary) hypertension: Secondary | ICD-10-CM

## 2017-01-29 NOTE — Patient Instructions (Addendum)
It was great to see you today!  Your blood pressure is coming down great, and thank you for bringing in your home cuff.  We saw about a 15 point difference in the top number on your home cuff compared to our cuff in clinic.  If you want to get a new cuff then see if you can check on the size recommendations.  Please start taking the spironolactone 50mg  daily.  We will see you back next Wednesday at 3:30 PM for follow up lab work and blood pressure check.

## 2017-01-29 NOTE — Progress Notes (Signed)
Patient ID: Brittany Lopez                 DOB: 1976/07/06                      MRN: [id number]     HPI: Brittany Lopez is a 41 y.o. female patient of Dr. Meda Coffee, referred by Melina Copa, PA who presents today for hypertension follow up. PMH includes morbid obesity, HTN, diabetes, chronic diastolic CHF, hypertensive heart disease, CKD, sickle cell trait, chronic microcytic anemia (IV iron). She is pending sleep study. BMET and TSH returned WNL. She has a history of difficult to control blood pressures. At her most recent OV her spironolactone was increased to 75m daily.   Unfortunately pt did not increase from 276mdaily to 5022maily because pt said no one called her after her lab work to confirm that this was okay. Overall she is doing well, and adherent to all other medications. Her only complaint was a "foggy feeling" that she notices with her lower readings (SBPs mid 130s). Pt checked BP multiple times a day (5-10 times) and her husband has told her she needs to stop checking so frequently as she is stressing herself out. Pt was counseled during last visit on how exactly to check her BP (check only twice daily, ~2 hours after medications) which she has since begun to do. Pt presented with cuff at appointment today. Home cuff readings were compared to clinic readings and a difference of ~60m27mwas observed between the two, with home readings being higher. Pt stated that cuff was "too old to remember" and it was also significantly smaller than the clinic cuff we used. Pt is making huge efforts regarding lifestyle interventions (will cook a separate meal for herself when she cooks for her family.)  Current HTN meds:  Losartan 100mg72mly in the morning Furosemide 40mg 24my Carvedilol 25mg B42mAmlodipine 10mg da43min the morning Spironolactone 25mg dai88mn the evening  BP goal: <130/80 mmHg  Family History: Mother with heart disease, HTN, stroke, DM.   Social History: Denies tobacco  products. Social alcohol use.   Diet: Most meals prepared from home. Avoids salt uses substitutes like Mrs.DASH. No coffee, rarely drinks tea. Usually drinks seltzer water. Has cut out pork and most red meats. Alters how she fixes her food even when cooking for family, so will cook two meals sometimes.  Exercise: Goes to gym for 1 hour doing cardio (treadmill and circuit room). She got to 2 miles on the treadmill before her appointment today, which was the first time since she started exercising!  Home BP readings:  Arm cuff: checking twice a day - readings have between mid 130s-160s, with high being 170/88 which was in the morning before all medications.  Home cuff 161/95 mmHg - left arm, clinic cuff 144/82 mmHg Home cuff 163/97 mmHg - right arm, clinic cuff 146/80 mmHg  Wt Readings from Last 3 Encounters:  01/08/17 263 lb 12 oz (119.6 kg)  12/22/16 264 lb 9.6 oz (120 kg)  09/25/16 271 lb (122.9 kg)   BP Readings from Last 3 Encounters:  01/15/17 (!) 164/90  01/08/17 (!) 180/96  01/05/17 (!) 186/92   Pulse Readings from Last 3 Encounters:  01/15/17 75  01/08/17 79  01/05/17 77    Renal function: CrCl cannot be calculated (Unknown ideal weight.).  Past Medical History:  Diagnosis Date  . Chronic diastolic CHF (congestive heart failure) (HCC)   .[place]  CKD (chronic kidney disease), stage II   . Diabetes mellitus without complication ([place])   . Hyperlipidemia   . Hypertension   . Hypertensive heart disease with CHF (congestive heart failure) ([place]) 01/08/2017  . Microcytic anemia   . Morbid obesity ([place])   . Sickle cell trait [facility])     Current Outpatient Prescriptions on File Prior to Visit  Medication Sig Dispense Refill  . amLODipine (NORVASC) 10 MG tablet Take 1 tablet (10 mg total) by mouth daily. 90 tablet 3  . carvedilol (COREG) 25 MG tablet TAKE 1 TABLET(25 MG) BY MOUTH TWICE DAILY WITH A MEAL - 034 tablet 3  . folic acid (FOLVITE) 1 MG tablet Take 1 tablet (1 mg total)  by mouth daily. 30 tablet 11  . furosemide (LASIX) 40 MG tablet TAKE 1 TABLET(40 MG) BY MOUTH DAILY - 90 tablet 3  . glucose monitoring kit (FREESTYLE) monitoring kit 1 each by Does not apply route as needed for other. 1 each 0  . insulin NPH-regular Human (NOVOLIN 70/30 RELION) (70-30) 100 UNIT/ML injection INJECT 0.15 MLS (15 UNITS TOTAL) INTO THE SKIN 2 TIMES DAILY WITH A MEAL PATIENT NEEDS OFFICE VISIT FOR MORE REFILLS" 2ND 10 mL 0  . Insulin Syringe-Needle U-100 (SAFETY-GLIDE 0.3CC SYR 29GX1/2) 29G X 1/2" 0.3 ML MISC Use as directed. 200 each 0  . losartan (COZAAR) 100 MG tablet Take 1 tablet (100 mg total) by mouth daily. Pt to follow up with cardiologist by May 2018 for possible med changes 30 tablet 0  . metFORMIN (GLUCOPHAGE) 1000 MG tablet TAKE 1 TABLET(1000 MG) BY MOUTH TWICE DAILY WITH A MEAL 180 tablet 0  . spironolactone (ALDACTONE) 50 MG tablet Take 1 tablet (50 mg total) by mouth daily. 30 tablet 1   No current facility-administered medications on file prior to visit.     No Known Allergies   Assessment/Plan:  1. Hypertension - pt still above goal BP of < 130/80 mmHg. Encouraged and congratulated her on all the lifestyle achievements that she has made. Will increase spironolactone to 56m daily as patient is tolerating medication well and had a great initial response to therapy. Home BP cuff is measuring ~~03points systolic higher than clinic reading, likely because home cuff is too small and is also old. Pt will look for new BP cuff. Instructed patient to continue to check readings twice a day as she has, and to bring in new cuff to next appointment. Will follow up in 1 week for BMET and BP check on higher spironolactone dose.  AMelburn Popper PharmD, BEltonResident 01/29/17 8:33 AM   MJinny BlossomE. Supple, PharmD, CPP, [id number]. [address] GGomer Am[place] [address]Phone: [phone] Fax: [phone]

## 2017-02-06 ENCOUNTER — Other Ambulatory Visit: Payer: BLUE CROSS/BLUE SHIELD | Admitting: *Deleted

## 2017-02-06 ENCOUNTER — Ambulatory Visit (INDEPENDENT_AMBULATORY_CARE_PROVIDER_SITE_OTHER): Payer: BLUE CROSS/BLUE SHIELD | Admitting: Pharmacist

## 2017-02-06 VITALS — BP 156/82 | HR 87

## 2017-02-06 DIAGNOSIS — I10 Essential (primary) hypertension: Secondary | ICD-10-CM | POA: Diagnosis not present

## 2017-02-06 NOTE — Progress Notes (Signed)
Patient ID: Brittany Lopez                 DOB: 07-Nov-1975                      MRN: [id number]     HPI: Brittany Bagsby Peoplesis a 41 y.o.femalepatient ofDr. Meda Coffee, referred by Brittany Copa, PA who presents today for hypertension follow up.PMH includes morbid obesity, HTN, diabetes, chronic diastolic CHF, hypertensive heart disease, CKD, sickle cell trait, chronic microcytic anemia (IV iron). She is pending sleep study. She has a history of difficult to control blood pressures and previously checked BP multiple times a day (5-10 times). She was counseled on how to take BP (check only twice daily, ~2 hours after medications) which she has since been doing. Of note, home cuff (~41 yo, and smaller than what is used in clinic) readings were ~29mHg greater than clinic readings when last checked on 01/29/17.  At her most recent OV her spironolactone was increased to 557mdaily.  Pt presents in good spirits with reborn (fake) baby - collecting these are a hobby of hers.  She continues to improve with lifestyle interventions. Pt did not bring home cuff today, but has also not had the chance to get a new one. Pt reports home readings in the 120-130s/70-80s. This did not correlate to clinic readings at all today where SBPs were in the 150-160s, verified x3. Pt stated at last visit that she has issues with higher readings when seen in clinic, especially with the first check then what she sees at home. She does have a history of white coat hypertension.  Current HTN meds:  Losartan 10022maily in the morning Furosemide 24m77mily Carvedilol 25mg27m  Amlodipine 10mg 61my in the morning Spironolactone 50mg d28m in the evening  BP goal: <130/80 mmHg  Family History: Mother with heart disease, HTN, stroke, DM.   Social History: Denies tobacco products. Social alcohol use.   Diet:Most meals prepared from home. Avoids salt uses substitutes like Mrs.DASH. No coffee, rarely drinks tea. Usually drinks  seltzer water. Has cut out pork and most red meats. Alters how she fixes her food even when cooking for family, so will cook two meals sometimes.  Exercise:Goes to gym for 1 hour doing cardio (treadmill and circuit room). She is to 2 miles on the treadmill and bike now at the gym.  Home BP readings:home cuff 120-130/70-80s     Wt Readings from Last 3 Encounters:  01/08/17 263 lb 12 oz (119.6 kg)  12/22/16 264 lb 9.6 oz (120 kg)  09/25/16 271 lb (122.9 kg)      BP Readings from Last 3 Encounters:  01/15/17 (!) 164/90  01/08/17 (!) 180/96  01/05/17 (!) 186/92      Pulse Readings from Last 3 Encounters:  01/15/17 75  01/08/17 79  01/05/17 77    Renal function: CrCl cannot be calculated (Unknown ideal weight.).      Past Medical History:  Diagnosis Date  . Chronic diastolic CHF (congestive heart failure) (HCC)   [place] (chronic kidney disease), stage II   . Diabetes mellitus without complication (HCC)   [place]lipidemia   . Hypertension   . Hypertensive heart disease with CHF (congestive heart failure) (HCC) 5/[place]018  . Microcytic anemia   . Morbid obesity (HCC)   [place]le cell trait (HCC)  [facility]     Current Outpatient Prescriptions on File Prior to Visit  Medication Sig Dispense Refill  . amLODipine (NORVASC) 10 MG tablet Take 1 tablet (10 mg total) by mouth daily. 90 tablet 3  . carvedilol (COREG) 25 MG tablet TAKE 1 TABLET(25 MG) BY MOUTH TWICE DAILY WITH A MEAL - 240 tablet 3  . folic acid (FOLVITE) 1 MG tablet Take 1 tablet (1 mg total) by mouth daily. 30 tablet 11  . furosemide (LASIX) 40 MG tablet TAKE 1 TABLET(40 MG) BY MOUTH DAILY - 90 tablet 3  . glucose monitoring kit (FREESTYLE) monitoring kit 1 each by Does not apply route as needed for other. 1 each 0  . insulin NPH-regular Human (NOVOLIN 70/30 RELION) (70-30) 100 UNIT/ML injection INJECT 0.15 MLS (15 UNITS TOTAL) INTO THE SKIN 2 TIMES DAILY WITH A MEAL PATIENT NEEDS OFFICE VISIT FOR MORE  REFILLS" 2ND 10 mL 0  . Insulin Syringe-Needle U-100 (SAFETY-GLIDE 0.3CC SYR 29GX1/2) 29G X 1/2" 0.3 ML MISC Use as directed. 200 each 0  . losartan (COZAAR) 100 MG tablet Take 1 tablet (100 mg total) by mouth daily. Pt to follow up with cardiologist by May 2018 for possible med changes 30 tablet 0  . metFORMIN (GLUCOPHAGE) 1000 MG tablet TAKE 1 TABLET(1000 MG) BY MOUTH TWICE DAILY WITH A MEAL 180 tablet 0  . spironolactone (ALDACTONE) 50 MG tablet Take 1 tablet (50 mg total) by mouth daily. 30 tablet 1   No current facility-administered medications on file prior to visit.     No Known Allergies   Assessment/Plan:  1. Hypertension - pt still above goal BP of < 130/80 mmHg. Home readings consistently in the 120-130s which contrasts clinic readings of SBP 150s-160. Hesitant to increase any medications without further cuff verification. Will continue spironolactone 83m daily, losartan 1054mdaily, furosemide 4087maily, amlodipine 44m24mily, and carvedilol 25mg62m. Instructed patient to purchase new cuff and continue to check readings twice a day as she has, and to bring in new cuff to next appointment. Will follow up in 2 weeks for BP check.  Brittany PopperrmD, BCPS [facility] Resident   Megan E. Supple, PharmD, CPP, BCACPSimpson [id number][id number]: [phone]: [phone]

## 2017-02-06 NOTE — Patient Instructions (Addendum)
It was great to see you today.  Continue to take your medications as you have been.  We will see you back in 2 weeks.  Go ahead and bring your home cuff back in at that time.

## 2017-02-07 ENCOUNTER — Telehealth: Payer: Self-pay | Admitting: *Deleted

## 2017-02-07 DIAGNOSIS — N182 Chronic kidney disease, stage 2 (mild): Secondary | ICD-10-CM

## 2017-02-07 DIAGNOSIS — I110 Hypertensive heart disease with heart failure: Secondary | ICD-10-CM

## 2017-02-07 DIAGNOSIS — I10 Essential (primary) hypertension: Secondary | ICD-10-CM

## 2017-02-07 DIAGNOSIS — I5032 Chronic diastolic (congestive) heart failure: Secondary | ICD-10-CM

## 2017-02-07 LAB — BASIC METABOLIC PANEL
BUN/Creatinine Ratio: 16 (ref 9–23)
BUN: 23 mg/dL (ref 6–24)
CO2: 19 mmol/L (ref 18–29)
Calcium: 9.4 mg/dL (ref 8.7–10.2)
Chloride: 102 mmol/L (ref 96–106)
Creatinine, Ser: 1.46 mg/dL — ABNORMAL HIGH (ref 0.57–1.00)
GFR calc Af Amer: 51 mL/min/{1.73_m2} — ABNORMAL LOW (ref >59)
GFR calc non Af Amer: 44 mL/min/{1.73_m2} — ABNORMAL LOW (ref >59)
Glucose: 85 mg/dL (ref 65–99)
Potassium: 4.3 mmol/L (ref 3.5–5.2)
Sodium: 138 mmol/L (ref 134–144)

## 2017-02-07 MED ORDER — SPIRONOLACTONE 25 MG PO TABS: 25.0000 mg | ORAL_TABLET | Freq: Every day | ORAL | 3 refills | Status: DC

## 2017-02-07 MED ORDER — FUROSEMIDE 20 MG PO TABS: 20.0000 mg | ORAL_TABLET | Freq: Every day | ORAL | 3 refills | Status: DC

## 2017-02-07 NOTE — Telephone Encounter (Signed)
-----   Message from Lars MassonKatarina H Nelson, MD sent at 02/07/2017  8:14 AM EDT ----- Please advise her to cut down lasix to 20 mg po daily and spironolactone to 25 mg po daily. We will recheck BMP at her appointment on 02/21/17.

## 2017-02-07 NOTE — Telephone Encounter (Signed)
Notified the pt that per Dr Delton SeeNelson, based on her labs, we should cut her lasix down to 20 mg po daily, and decrease her spironolactone to 25 mg po daily, and recheck a bmet at her HTN clinic appt on 02/21/17.  Confirmed the pharmacy of choice with the pt.  Scheduled the pt a lab appt on 02/21/17, same day as she comes in for HTN clinic, to recheck a bmet.   Pt verbalized understanding and agrees with this plan.

## 2017-02-21 ENCOUNTER — Ambulatory Visit (INDEPENDENT_AMBULATORY_CARE_PROVIDER_SITE_OTHER): Payer: BLUE CROSS/BLUE SHIELD | Admitting: Pharmacist

## 2017-02-21 ENCOUNTER — Other Ambulatory Visit: Payer: BLUE CROSS/BLUE SHIELD | Admitting: *Deleted

## 2017-02-21 VITALS — BP 150/92 | HR 81 | Wt 261.4 lb

## 2017-02-21 DIAGNOSIS — N182 Chronic kidney disease, stage 2 (mild): Secondary | ICD-10-CM

## 2017-02-21 DIAGNOSIS — I110 Hypertensive heart disease with heart failure: Secondary | ICD-10-CM

## 2017-02-21 DIAGNOSIS — I10 Essential (primary) hypertension: Secondary | ICD-10-CM

## 2017-02-21 DIAGNOSIS — I5032 Chronic diastolic (congestive) heart failure: Secondary | ICD-10-CM

## 2017-02-21 MED ORDER — HYDRALAZINE HCL 25 MG PO TABS: 25.0000 mg | ORAL_TABLET | Freq: Three times a day (TID) | ORAL | 11 refills | Status: DC

## 2017-02-21 NOTE — Patient Instructions (Signed)
It was great to see you today! Keep up the great work!!!   Please start taking hydralazine 25 mg three times a day. Call us if you have headaches that are too bothersome, dizziness, or multiple BP readings > 150/90 mmHg.   Follow up with us in 4 weeks

## 2017-02-21 NOTE — Progress Notes (Signed)
Patient ID: Brittany Lopez                 DOB: 02/19/1976                      MRN: [id number]     HPI: Brittany Lopez is a 41 y.o. female patient ofDr. Meda Coffee, referred by Melina Copa, PA who presents today for hypertension follow up.PMH includes morbid obesity, HTN, diabetes, chronic diastolic CHF, hypertensive heart disease, CKD, sickle cell trait, chronic microcytic anemia (IV iron). She is pending sleep study.   She has a history of difficult to control blood pressures and previously checked BP multiple times a day (5-10 times). She was counseled on how to take BP (check only twice daily, ~2 hours after medications) which she has since been doing. Of note, home cuff (~41 yo, and smaller than what is used in clinic) readings were ~7mHg greater than clinic readings when last checked on 01/29/17.  At her most recent HTN clinic visit on 02/06/17, patient was hesitant to increase medications without further cuff verification. Medications were continued and patient was encouraged to get a new cuff. However, Scr was noted to be increased to 1.46 (baseline 1.1-1.2) on 02/06/17 BMet, and patient was advised to decrease furosemide to 251mdaily and spironolactone to 25109maily.   Patient presents in good spirits. She continues to improve with lifestyle interventions, increasing physical activity and weight loss. 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 She brought her home cuff to the visit today, the BP reading was 153/96, HR 76 - similar to the manual reading in clinic today. She has not had the chance to get a new one since she had to get new medications (she was told that she couldn't cut the pills in half). She reports home BP readings ~137/82 and HR ~72. Patient seems to have higher readings when seen in clinic than what she sees at home. She does have a history of white coat hypertension. She denies dizziness, headaches, and blurred vision. Bmet was repeated  today.  Current HTN meds:  Amlodipine 60m59mily in the morning Carvedilol 25mg46m  Furosemide 20mg 35my Losartan 100mg d21m in the morning Spironolactone 25mg da32min the evening  BP goal:<130/80 mmHg  Family History: Mother with heart disease, HTN, stroke, DM.   Social History: Denies tobacco products. Social alcohol use.   Diet:Most meals prepared from home. Avoids salt uses substitutes like Mrs.DASH. No coffee, rarely drinks tea. Usually drinks seltzer water. Has cut out pork and most red meats. Alters how she fixes her food even when cooking for family, so will cook two meals sometimes.  Exercise:Goes to gym for 1 hour doing cardio (treadmill and circuit room). She is up to 4.5 miles on the  bike and 2 miles on the treadmill at an incline now at the gym.  Home BP readings: 124/79, 128/85, 136/81, 136/78, 130/76, 138/79, 149/83, 143/87, 149/89, 147/87, 136/83; HR 77, 70, 71, 67, 68, 76, 69, 79, 70, 68, 77  Wt Readings from Last 3 Encounters:  02/21/17 261 lb 6.4 oz (118.6 kg)  01/08/17 263 lb 12 oz (119.6 kg)  12/22/16 264 lb 9.6 oz (120 kg)   BP Readings from Last 3 Encounters:  02/21/17 (!) 150/92  02/06/17 (!) 156/82  01/29/17 (!) 146/80   Pulse Readings from Last 3 Encounters:  02/21/17 81  02/06/17 87  01/29/17 79  Renal function: Estimated Creatinine Clearance: 63.2 mL/min (A) (by C-G formula based on SCr of 1.46 mg/dL (H)).  Past Medical History:  Diagnosis Date  . Chronic diastolic CHF (congestive heart failure) ([place])   . CKD (chronic kidney disease), stage II   . Diabetes mellitus without complication ([place])   . Hyperlipidemia   . Hypertension   . Hypertensive heart disease with CHF (congestive heart failure) ([place]) 01/08/2017  . Microcytic anemia   . Morbid obesity ([place])   . Sickle cell trait [facility])     Current Outpatient Prescriptions on File Prior to Visit  Medication Sig Dispense Refill  . amLODipine (NORVASC) 10 MG tablet Take 1 tablet  (10 mg total) by mouth daily. 90 tablet 3  . carvedilol (COREG) 25 MG tablet TAKE 1 TABLET(25 MG) BY MOUTH TWICE DAILY WITH A MEAL - 696 tablet 3  . folic acid (FOLVITE) 1 MG tablet Take 1 tablet (1 mg total) by mouth daily. 30 tablet 11  . furosemide (LASIX) 20 MG tablet Take 1 tablet (20 mg total) by mouth daily. 90 tablet 3  . glucose monitoring kit (FREESTYLE) monitoring kit 1 each by Does not apply route as needed for other. 1 each 0  . insulin NPH-regular Human (NOVOLIN 70/30 RELION) (70-30) 100 UNIT/ML injection INJECT 0.15 MLS (15 UNITS TOTAL) INTO THE SKIN 2 TIMES DAILY WITH A MEAL PATIENT NEEDS OFFICE VISIT FOR MORE REFILLS" 2ND 10 mL 0  . Insulin Syringe-Needle U-100 (SAFETY-GLIDE 0.3CC SYR 29GX1/2) 29G X 1/2" 0.3 ML MISC Use as directed. 200 each 0  . losartan (COZAAR) 100 MG tablet Take 1 tablet (100 mg total) by mouth daily. Pt to follow up with cardiologist by May 2018 for possible med changes 30 tablet 0  . metFORMIN (GLUCOPHAGE) 1000 MG tablet TAKE 1 TABLET(1000 MG) BY MOUTH TWICE DAILY WITH A MEAL 180 tablet 0  . spironolactone (ALDACTONE) 25 MG tablet Take 1 tablet (25 mg total) by mouth daily. 90 tablet 3   No current facility-administered medications on file prior to visit.     No Known Allergies   Assessment/Plan:  1. Hypertension - BP above goal of < 130/80 mmHg likely due to decrease in dose of spironolactone and furosemide because of lab abnormalities. Will start hydralazine 25 mg tid, continue all other medications. Repeat Bmet today. Encouraged pt to continue increase in physical activity. Follow-up with HTN clinic in 4 weeks.  Belia Heman, PharmD PGY1 Resident 02/21/2017 4:27 PM  Patient seen with: Fuller Canada, PharmD, CPP, [place] [address] [place], [place] [address] Phone: [phone]; Fax: [phone]

## 2017-02-22 LAB — BASIC METABOLIC PANEL
BUN/Creatinine Ratio: 17 (ref 9–23)
BUN: 22 mg/dL (ref 6–24)
CO2: 22 mmol/L (ref 20–29)
Calcium: 9.6 mg/dL (ref 8.7–10.2)
Chloride: 104 mmol/L (ref 96–106)
Creatinine, Ser: 1.26 mg/dL — ABNORMAL HIGH (ref 0.57–1.00)
GFR calc Af Amer: 61 mL/min/{1.73_m2} (ref >59)
GFR calc non Af Amer: 53 mL/min/{1.73_m2} — ABNORMAL LOW (ref >59)
Glucose: 98 mg/dL (ref 65–99)
Potassium: 4.4 mmol/L (ref 3.5–5.2)
Sodium: 142 mmol/L (ref 134–144)

## 2017-03-01 ENCOUNTER — Telehealth: Payer: Self-pay

## 2017-03-01 NOTE — Telephone Encounter (Signed)
Brittany Lopez called this morning to report her recent BP readings after starting hydralazine 25mg  tid. BP readings reported: 118/67, 131/75, 113/66, 133/74 - all at goal of < 130/6380mmHg. She voiced concern that these readings were too low. She denied dizziness and near syncope. Educated that these BP readings are great and at goal. Encouraged patient to continue medications and lifestyle modifications. Patient voiced understanding and excitement at meeting her goal.

## 2017-03-17 ENCOUNTER — Ambulatory Visit (HOSPITAL_BASED_OUTPATIENT_CLINIC_OR_DEPARTMENT_OTHER): Payer: BLUE CROSS/BLUE SHIELD | Attending: Physician Assistant | Admitting: Cardiology

## 2017-03-17 VITALS — Ht 63.0 in | Wt 260.0 lb

## 2017-03-17 DIAGNOSIS — I110 Hypertensive heart disease with heart failure: Secondary | ICD-10-CM | POA: Diagnosis not present

## 2017-03-17 DIAGNOSIS — G4736 Sleep related hypoventilation in conditions classified elsewhere: Secondary | ICD-10-CM | POA: Diagnosis not present

## 2017-03-17 DIAGNOSIS — I509 Heart failure, unspecified: Secondary | ICD-10-CM | POA: Diagnosis not present

## 2017-03-17 DIAGNOSIS — E669 Obesity, unspecified: Secondary | ICD-10-CM | POA: Insufficient documentation

## 2017-03-17 DIAGNOSIS — G4733 Obstructive sleep apnea (adult) (pediatric): Secondary | ICD-10-CM | POA: Diagnosis not present

## 2017-03-18 NOTE — Procedures (Signed)
   Patient Name: Brittany Lopez, Elinda Study Date: 03/17/2017 Gender: Female D.O.B: 09-24-1975 Age (years): [age] Referring Provider: Ronie Spiesayna Dunn Height (inches): 63 Interpreting Physician: Armanda Magicraci Delia Sitar MD, ABSM Weight (lbs): 260 RPSGT: Cherylann ParrDubili, Fred BMI: 46 MRN: [id number] Neck Size: 15.50  CLINICAL INFORMATION Sleep Study Type: NPSG  Indication for sleep study: Congestive Heart Failure, Excessive Daytime Sleepiness, Fatigue, Hypertension, Obesity, Snoring, Witnessed Apneas  Epworth Sleepiness Score: 1  SLEEP STUDY TECHNIQUE As per the AASM Manual for the Scoring of Sleep and Associated Events v2.3 (April 2016) with a hypopnea requiring 4% desaturations.  The channels recorded and monitored were frontal, central and occipital EEG, electrooculogram (EOG), submentalis EMG (chin), nasal and oral airflow, thoracic and abdominal wall motion, anterior tibialis EMG, snore microphone, electrocardiogram, and pulse oximetry.  MEDICATIONS Medications self-administered by patient taken the night of the study : HYDRALAZINE  SLEEP ARCHITECTURE The study was initiated at 10:18:10 PM and ended at 5:29:26 AM.  Sleep onset time was 29.8 minutes and the sleep efficiency was 44.3%. The total sleep time was 190.9 minutes.  Stage REM latency was 359.5 minutes.  The patient spent 18.07% of the night in stage N1 sleep, 64.65% in stage N2 sleep, 0.00% in stage N3 and 17.28% in REM.  Alpha intrusion was absent.  Supine sleep was 24.35%.  RESPIRATORY PARAMETERS The overall apnea/hypopnea index (AHI) was 20.4 per hour. There were 33 total apneas, including 33 obstructive, 0 central and 0 mixed apneas. There were 32 hypopneas and 0 RERAs.  The AHI during Stage REM sleep was 92.7 per hour.  AHI while supine was 2.6 per hour.  The mean oxygen saturation was 92.20%. The minimum SpO2 during sleep was 74.00%.  Moderate snoring was noted during this study.  CARDIAC DATA The 2 lead EKG demonstrated  sinus rhythm. The mean heart rate was 71.04 beats per minute. Other EKG findings include: None.  LEG MOVEMENT DATA The total PLMS were 52 with a resulting PLMS index of 16.34. Associated arousal with leg movement index was 1.3 .  IMPRESSIONS - Moderate obstructive sleep apnea occurred during this study (AHI = 20.4/h). - No significant central sleep apnea occurred during this study (CAI = 0.0/h). - Moderate oxygen desaturation was noted during this study (Min O2 = 74.00%). - The patient snored with Moderate snoring volume. - No cardiac abnormalities were noted during this study. - Mild periodic limb movements of sleep occurred during the study. No significant associated arousals.  DIAGNOSIS - Obstructive Sleep Apnea (327.23 [G47.33 ICD-10]) - Nocturnal Hypoxemia (327.26 [G47.36 ICD-10])  RECOMMENDATIONS - Therapeutic CPAP titration to determine optimal pressure required to alleviate sleep disordered breathing. - Avoid alcohol, sedatives and other CNS depressants that may worsen sleep apnea and disrupt normal sleep architecture. - Sleep hygiene should be reviewed to assess factors that may improve sleep quality. - Weight management and regular exercise should be initiated or continued if appropriate.  Armanda Magicraci Kripa Foskey Diplomate, American Board of Sleep Medicine  ELECTRONICALLY SIGNED ON:  03/18/2017, 8:22 PM [facility] PH: [phone]   FX: [phone]) [phone] ACCREDITED BY THE AMERICAN ACADEMY OF SLEEP MEDICINE

## 2017-03-19 ENCOUNTER — Ambulatory Visit (INDEPENDENT_AMBULATORY_CARE_PROVIDER_SITE_OTHER): Payer: BLUE CROSS/BLUE SHIELD | Admitting: Pharmacist

## 2017-03-19 ENCOUNTER — Other Ambulatory Visit: Payer: Self-pay | Admitting: Physician Assistant

## 2017-03-19 ENCOUNTER — Telehealth: Payer: Self-pay | Admitting: *Deleted

## 2017-03-19 VITALS — BP 132/90 | HR 81 | Ht 63.0 in | Wt 258.0 lb

## 2017-03-19 DIAGNOSIS — I10 Essential (primary) hypertension: Secondary | ICD-10-CM

## 2017-03-19 DIAGNOSIS — E1165 Type 2 diabetes mellitus with hyperglycemia: Secondary | ICD-10-CM

## 2017-03-19 NOTE — Telephone Encounter (Signed)
Please let sleep lab know to do titration with a ResMed nasal pillow mask and chin strap

## 2017-03-19 NOTE — Patient Instructions (Signed)
It was nice to see you today - your blood pressure looks much better!  Continue your current medications and continue to stay active - this will help to lower your bottom (diastolic) blood pressure reading  Follow up with Dr Delton SeeNelson in 3 months for your annual visit  Call the blood pressure clinic before then with any concerns [phone]

## 2017-03-19 NOTE — Telephone Encounter (Signed)
-----   Message from Quintella Reichertraci R Turner, MD sent at 03/18/2017  8:24 PM EDT ----- Please let patient know that they have sleep apnea and recommend CPAP titration. Please set up titration in the sleep lab.

## 2017-03-19 NOTE — Telephone Encounter (Signed)
Informed patient of titration results and patient understanding was verbalized.  Patient had a hard time getting through the test.  Patient didn't she was claustrophobic until the mask was put on her. Patient states she felt like she was being smothered with one mask they tried and the other mask made her feel like she was going to freak out.  Patient said they tried a nasal cannula and that worked better for her.  Patient is willing to test again if she can just wear the nasal cannula again.

## 2017-03-19 NOTE — Progress Notes (Signed)
Patient ID: Brittany Lopez                 DOB: 1976-01-10                      MRN: [id number]     HPI: Brittany Lopez is a 41 y.o. female patient ofDr. Meda Coffee, referred by Melina Copa, PA who presents today for hypertension follow up.PMH includes morbid obesity, HTN, diabetes, chronic diastolic CHF, hypertensive heart disease, CKD, sickle cell trait, chronic microcytic anemia (IV iron). She is pending sleep study. She has a history of difficult to control blood pressures and previously checked BP multiple times a day (5-10 times). She was counseled on how to take BP (check only twice daily, ~2 hours after medications) which she has since been doing. Of note, home cuff (~41 yo, and smaller than what is used in clinic) readings were ~29mHg greater than clinic readings when last checked on 01/29/17. Follow up Scr was noted to be increased to 1.46 (baseline 1.1-1.2) on 02/06/17 BMet, and patient was advised to decrease furosemide to '20mg'$  daily and spironolactone to '25mg'$  daily. At last visit, patient was started on hydralazine TID. She called clinic 1 week later with reports of BP readings 110-130/60-70s and was advised that these readings are excellent. She presents today for follow up.  Pt reports feeling well overall. She denies dizziness, blurred vision, or headaches. She continues to improve with lifestyle interventions, increasing physical activity, and weight loss. 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 She is interested in returning to a pescetarian diet. Her BP readings at home have continued to stay between 110-130/70-80.   Current HTN meds:  Amlodipine '10mg'$  daily (morning) Carvedilol '25mg'$  BID  Furosemide '20mg'$  daily (morning) Losartan '100mg'$  daily (morning) Spironolactone '25mg'$  daily (evening) *SCr bumped on '50mg'$  dosing Hydralazine '25mg'$  TID  BP goal:<130/80 mmHg  Family History: Mother with heart disease, HTN, stroke, DM.   Social History:  Denies tobacco products. Social alcohol use.   Diet:Most meals prepared from home. Avoids salt uses substitutes like Mrs.DASH. No coffee, rarely drinks tea. Usually drinks seltzer water. Has cut out pork and most red meats. Alters how she fixes her food even when cooking for family, so will cook two meals sometimes. Would like to go back to a pescetarian diet.  Exercise:Goes to gym for 1 hour doing cardio (treadmill and circuit room). She is up to 4.5 miles on the  bike and 2 miles on the treadmill at an incline now at the gym.  Wt Readings from Last 3 Encounters:  03/17/17 260 lb (117.9 kg)  02/21/17 261 lb 6.4 oz (118.6 kg)  01/08/17 263 lb 12 oz (119.6 kg)   BP Readings from Last 3 Encounters:  02/21/17 (!) 150/92  02/06/17 (!) 156/82  01/29/17 (!) 146/80   Pulse Readings from Last 3 Encounters:  02/21/17 81  02/06/17 87  01/29/17 79    Renal function: CrCl cannot be calculated (Patient's most recent lab result is older than the maximum 21 days allowed.).  Past Medical History:  Diagnosis Date  . Chronic diastolic CHF (congestive heart failure) ([place]   . CKD (chronic kidney disease), stage II   . Diabetes mellitus without complication ([place]   . Hyperlipidemia   . Hypertension   . Hypertensive heart disease with CHF (congestive heart failure) ([place] 01/08/2017  . Microcytic anemia   . Morbid obesity ([place]   . Sickle  cell trait [facility])     Current Outpatient Prescriptions on File Prior to Visit  Medication Sig Dispense Refill  . amLODipine (NORVASC) 10 MG tablet Take 1 tablet (10 mg total) by mouth daily. 90 tablet 3  . carvedilol (COREG) 25 MG tablet TAKE 1 TABLET(25 MG) BY MOUTH TWICE DAILY WITH A MEAL - 854 tablet 3  . folic acid (FOLVITE) 1 MG tablet Take 1 tablet (1 mg total) by mouth daily. 30 tablet 11  . furosemide (LASIX) 20 MG tablet Take 1 tablet (20 mg total) by mouth daily. 90 tablet 3  . glucose monitoring kit (FREESTYLE) monitoring kit 1 each by Does not  apply route as needed for other. 1 each 0  . hydrALAZINE (APRESOLINE) 25 MG tablet Take 1 tablet (25 mg total) by mouth 3 (three) times daily. 90 tablet 11  . insulin NPH-regular Human (NOVOLIN 70/30 RELION) (70-30) 100 UNIT/ML injection INJECT 0.15 MLS (15 UNITS TOTAL) INTO THE SKIN 2 TIMES DAILY WITH A MEAL PATIENT NEEDS OFFICE VISIT FOR MORE REFILLS" 2ND 10 mL 0  . Insulin Syringe-Needle U-100 (SAFETY-GLIDE 0.3CC SYR 29GX1/2) 29G X 1/2" 0.3 ML MISC Use as directed. 200 each 0  . losartan (COZAAR) 100 MG tablet Take 1 tablet (100 mg total) by mouth daily. Pt to follow up with cardiologist by May 2018 for possible med changes 30 tablet 0  . metFORMIN (GLUCOPHAGE) 1000 MG tablet TAKE 1 TABLET(1000 MG) BY MOUTH TWICE DAILY WITH A MEAL 180 tablet 0  . spironolactone (ALDACTONE) 25 MG tablet Take 1 tablet (25 mg total) by mouth daily. 90 tablet 3   No current facility-administered medications on file prior to visit.     No Known Allergies   Assessment/Plan:  1. Resistant hypertension - BP is much improved to 132/29mHg compared to readings of 180/90s when she first came to HTN clinic. Goal BP is < 130/859mg. Will continue amlodipine 1022maily, carvedilol 64m37mD, furosemide 20mg62mly, losartan 100mg 37my, spironolactone 64mg d85m, and hydralazine 64mg TI44mncouraged pt to continue with healthy eating, regular exercise, and weight loss. Pt has been committed to improving her lifestyle and she has lost 5 lbs in the past few months. Lifestyle improvements will help the most to bring her elevated diastolic reading to goal. Pt has annual follow up with Dr Nelson iMeda Coffeenths, can f/u in HTN clinic as needed.   Megan E. Supple, PharmD, CPP, BCACP [place] [address] [phone] 11:39 AM

## 2017-03-20 NOTE — Telephone Encounter (Signed)
It states below that they tried a nasal cannula mask that she tolerated well and that is the nasal pillow mask - please confirm with patient

## 2017-03-20 NOTE — Telephone Encounter (Signed)
Reached out to patient again and she states that "yes they did try the nasal cannula and the nasal pillow". Patient states "at this point she is declining to do the CPAP titration and she will call our office if she has a problem".

## 2017-03-20 NOTE — Telephone Encounter (Signed)
Informed patient that a Resmed nasal pillow mask and chin strap has been suggested by Dr Mayford Knifeurner as she thinks that would work better for the patient. Patient states the sleep lab tried that with her and it was very uncomfortable to wear also. Sleep lab states those are the only mask they have to use on the patients.

## 2017-03-29 NOTE — Telephone Encounter (Deleted)
-----   Message from Traci R Turner, MD sent at 03/18/2017  8:24 PM EDT ----- Please let patient know that they have sleep apnea and recommend CPAP titration. Please set up titration in the sleep lab. 

## 2017-04-19 ENCOUNTER — Telehealth: Payer: Self-pay | Admitting: Physician Assistant

## 2017-04-19 ENCOUNTER — Ambulatory Visit (INDEPENDENT_AMBULATORY_CARE_PROVIDER_SITE_OTHER): Payer: BLUE CROSS/BLUE SHIELD | Admitting: Physician Assistant

## 2017-04-19 ENCOUNTER — Encounter: Payer: Self-pay | Admitting: Physician Assistant

## 2017-04-19 VITALS — BP 134/76 | HR 79 | Temp 98.5°F | Resp 18 | Ht 63.0 in | Wt 255.6 lb

## 2017-04-19 DIAGNOSIS — E1165 Type 2 diabetes mellitus with hyperglycemia: Secondary | ICD-10-CM

## 2017-04-19 DIAGNOSIS — Z794 Long term (current) use of insulin: Secondary | ICD-10-CM

## 2017-04-19 DIAGNOSIS — I5032 Chronic diastolic (congestive) heart failure: Secondary | ICD-10-CM | POA: Diagnosis not present

## 2017-04-19 DIAGNOSIS — I10 Essential (primary) hypertension: Secondary | ICD-10-CM | POA: Diagnosis not present

## 2017-04-19 MED ORDER — METFORMIN HCL 1000 MG PO TABS: ORAL_TABLET | ORAL | 0 refills | Status: DC

## 2017-04-19 MED ORDER — FOLIC ACID 1 MG PO TABS: 1.0000 mg | ORAL_TABLET | Freq: Every day | ORAL | 4 refills | Status: DC

## 2017-04-19 MED ORDER — DULAGLUTIDE 0.75 MG/0.5ML ~~LOC~~ SOAJ: 0.7500 mg | SUBCUTANEOUS | 0 refills | Status: DC

## 2017-04-19 NOTE — Patient Instructions (Signed)
     IF you received an x-ray today, you will receive an invoice from [facility]. Please contact [facility] at [phone] with questions or concerns regarding your invoice.   IF you received labwork today, you will receive an invoice from [facility]. Please contact [facility] at [phone] with questions or concerns regarding your invoice.   Our billing staff will not be able to assist you with questions regarding bills from these companies.  You will be contacted with the lab results as soon as they are available. The fastest way to get your results is to activate your My Chart account. Instructions are located on the last page of this paperwork. If you have not heard from us regarding the results in 2 weeks, please contact this office.     

## 2017-04-19 NOTE — Telephone Encounter (Signed)
Pt is calling to see if Weber wanted her to start taking the Trulicity now or when her labs come back.  She states that if she wants to wait on the Trulicity then she needs to purchase another box of syringes. Please advise  [phone]

## 2017-04-19 NOTE — Progress Notes (Signed)
Brittany Lopez  MRN: [id number] DOB: 1975-11-29  PCP: Mancel Bale, PA-C  Chief Complaint  Patient presents with  . Diabetes  . Hypertension    Subjective:  Pt presents to clinic for diabetes and hypertension follow up.  Blood pressure -Takes blood pressure daily and reports 130s/80s. Had one reading at 117/60-70, but was not feeling faint or ill. -Decreased sodium intake with no table salt, no salt added to her home cooked meals.  -Exercise -- recently decreased in the past 2 weeks because she is stressed out due to moving. Attempts to walk 1 mile daily, which takes her a little less than 30 minutes. Before move was was going to the gym every other day and started biking and getting up to 4.5 miles.  Diabetes -Has not been checking sugar for the past month because she ran out of test strips. Does not keep a log of her blood sugars, but reports they are not running high after a meal.  -eye exam not scheduled but planning on it this year -patient would like to discuss if insulin is still needed to control her diabetes as at her last visit we talked about non-insulin treatment.  Obesity - pt continues to eat healthy and plans to restart exercise now that school is over and she is looking for a job  Health maintenance -plans PAP smear with OBGYN once her move is over  Review of Systems  Constitutional: Negative for chills and fever.  Eyes: Negative for visual disturbance.  Respiratory: Negative for chest tightness, shortness of breath and wheezing.   Cardiovascular: Positive for leg swelling (Dissapears with elevation). Negative for chest pain and palpitations.  Gastrointestinal: Negative for blood in stool, constipation, diarrhea, nausea and vomiting.  Endocrine: Negative for polyuria.  Musculoskeletal: Negative for myalgias.  Skin: Negative for wound.  Neurological: Positive for numbness (fingers in the am). Negative for dizziness, syncope and light-headedness.     Patient Active Problem List   Diagnosis Date Noted  . CKD (chronic kidney disease), stage II 01/08/2017  . Hypertensive heart disease with CHF (congestive heart failure) ([place]) 01/08/2017  . Iron deficiency anemia 11/24/2014  . Sickle cell trait ([place]) 11/08/2014  . Microalbuminuria 11/08/2014  . Chronic diastolic CHF (congestive heart failure), NYHA class 2 ([place]) 08/23/2014  . Morbid obesity ([place]) 08/23/2014  . Uncontrolled hypertension 05/31/2014  . Insulin dependent type 2 diabetes mellitus, controlled ([place]) 03/30/2014  . Essential hypertension 03/30/2014    Current Outpatient Prescriptions on File Prior to Visit  Medication Sig Dispense Refill  . amLODipine (NORVASC) 10 MG tablet Take 1 tablet (10 mg total) by mouth daily. 90 tablet 3  . carvedilol (COREG) 25 MG tablet TAKE 1 TABLET(25 MG) BY MOUTH TWICE DAILY WITH A MEAL - 180 tablet 3  . furosemide (LASIX) 20 MG tablet Take 1 tablet (20 mg total) by mouth daily. 90 tablet 3  . hydrALAZINE (APRESOLINE) 25 MG tablet Take 1 tablet (25 mg total) by mouth 3 (three) times daily. 90 tablet 11  . insulin NPH-regular Human (NOVOLIN 70/30 RELION) (70-30) 100 UNIT/ML injection INJECT 0.15 MLS (15 UNITS TOTAL) INTO THE SKIN 2 TIMES DAILY WITH A MEAL PATIENT NEEDS OFFICE VISIT FOR MORE REFILLS" 2ND 10 mL 0  . Insulin Syringe-Needle U-100 (SAFETY-GLIDE 0.3CC SYR 29GX1/2) 29G X 1/2" 0.3 ML MISC Use as directed. 200 each 0  . losartan (COZAAR) 100 MG tablet Take 1 tablet (100 mg total) by mouth daily. Pt to follow up with cardiologist by  May 2018 for possible med changes 30 tablet 0  . spironolactone (ALDACTONE) 25 MG tablet Take 1 tablet (25 mg total) by mouth daily. 90 tablet 3   No current facility-administered medications on file prior to visit.     No Known Allergies  Past Medical History:  Diagnosis Date  . Chronic diastolic CHF (congestive heart failure) ([place])   . CKD (chronic kidney disease), stage II   . Diabetes mellitus without  complication ([place])   . Hyperlipidemia   . Hypertension   . Hypertensive heart disease with CHF (congestive heart failure) ([place]) 01/08/2017  . Microcytic anemia   . Morbid obesity ([place])   . 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 Is currently looking for a job.      Lives with husband and daugheter who is 91.      Seat belt 100% of time.   Social History  Substance Use Topics  . Smoking status: Never Smoker  . Smokeless tobacco: Never Used  . Alcohol use Yes     Comment: socially, 1x month   family history includes Diabetes in her mother; Heart disease in her mother; Hypertension in her mother; Sjogren's syndrome in her mother; Stroke in her mother.     Objective:  BP 134/76   Pulse 79   Temp 98.5 F (36.9 C) (Oral)   Resp 18   Ht '5\' 3"'  (1.6 m)   Wt 255 lb 9.6 oz (115.9 kg)   LMP 03/24/2017 (Approximate)   SpO2 97%   BMI 45.28 kg/m  Body mass index is 45.28 kg/m.  Diabetic Foot Exam - Simple   Simple Foot Form Visual Inspection No deformities, no ulcerations, no other skin breakdown bilaterally:  Yes Sensation Testing Intact to touch and monofilament testing bilaterally:  Yes Pulse Check Posterior Tibialis and Dorsalis pulse intact bilaterally:  Yes Comments     Physical Exam  Constitutional: She is oriented to person, place, and time and well-developed, well-nourished, and in no distress.  HENT:  Head: Normocephalic and atraumatic.  Right Ear: External ear normal.  Left Ear: External ear normal.  Eyes: Conjunctivae are normal.  Cardiovascular: Normal rate and regular rhythm.   Murmur heard.  Systolic murmur is present with a grade of 2/6  Pulses:      Radial pulses are 2+ on the right side, and 2+ on the left side.       Dorsalis pedis pulses are 2+ on the right side, and 2+ on the left side.  No edema noted  Pulmonary/Chest:  Effort normal and breath sounds normal.  Neurological: She is alert and oriented to person, place, and time. Gait normal.  Sensory to light touch intact bilaterally.   Skin: Skin is warm and dry.  Psychiatric: Mood, memory, affect and judgment normal.  Vitals reviewed.   Assessment and Plan :  Type 2 diabetes mellitus with hyperglycemia, with long-term current use of insulin (HCC) - Plan: HM Diabetes Foot Exam, metFORMIN (GLUCOPHAGE) 7026 MG tablet, folic acid (FOLVITE) 1 MG tablet, Hemoglobin A1c, Lipid panel, Dulaglutide (TRULICITY) 3.78 HY/8.5OY SOPN -Discontinue insulin NPH as this was started in the hospital and we will try other agents in hopes to help with her weight loss which will ultimately help with her DM control.  Start trulicity injection once a week. Patients last HA1C was 5.2.  After once month of use, please  send me a mychart message to evaluate your change in therapy and we may need to increase your dose based on your glucose readings - she was instructed on how to use the medications  Essential hypertension - Plan: CMP14+EGFR - continue current medications -Exercise: aim for 150 minutes of cardio/wk -Continue making healthy eating choices    Windell Hummingbird PA-C  [facility] at [facility] 04/19/2017 1:38 PM

## 2017-04-20 LAB — CMP14+EGFR
A/G RATIO: 1.3 (ref 1.2–2.2)
ALBUMIN: 4.3 g/dL (ref 3.5–5.5)
ALT: 5 IU/L (ref 0–32)
AST: 10 IU/L (ref 0–40)
Alkaline Phosphatase: 68 IU/L (ref 39–117)
BUN / CREAT RATIO: 14 (ref 9–23)
BUN: 19 mg/dL (ref 6–24)
CALCIUM: 9.6 mg/dL (ref 8.7–10.2)
CHLORIDE: 107 mmol/L — AB (ref 96–106)
CO2: 18 mmol/L — ABNORMAL LOW (ref 20–29)
Creatinine, Ser: 1.32 mg/dL — ABNORMAL HIGH (ref 0.57–1.00)
GFR calc Af Amer: 58 mL/min/{1.73_m2} — ABNORMAL LOW (ref >59)
GFR, EST NON AFRICAN AMERICAN: 50 mL/min/{1.73_m2} — AB (ref >59)
GLOBULIN, TOTAL: 3.3 g/dL (ref 1.5–4.5)
Glucose: 89 mg/dL (ref 65–99)
POTASSIUM: 4.7 mmol/L (ref 3.5–5.2)
SODIUM: 137 mmol/L (ref 134–144)
TOTAL PROTEIN: 7.6 g/dL (ref 6.0–8.5)

## 2017-04-20 LAB — LIPID PANEL
CHOLESTEROL TOTAL: 158 mg/dL (ref 100–199)
Chol/HDL Ratio: 3.4 ratio (ref 0.0–4.4)
HDL: 46 mg/dL (ref >39)
LDL Calculated: 99 mg/dL (ref 0–99)
Triglycerides: 67 mg/dL (ref 0–149)
VLDL Cholesterol Cal: 13 mg/dL (ref 5–40)

## 2017-04-20 LAB — HEMOGLOBIN A1C
Est. average glucose Bld gHb Est-mCnc: 108 mg/dL
HEMOGLOBIN A1C: 5.4 % (ref 4.8–5.6)

## 2017-04-22 ENCOUNTER — Encounter: Payer: Self-pay | Admitting: Physician Assistant

## 2017-04-22 NOTE — Telephone Encounter (Signed)
No go ahead and start - I have sent this to her in a mychart message.

## 2017-05-07 ENCOUNTER — Encounter: Payer: Self-pay | Admitting: Physician Assistant

## 2017-05-08 NOTE — Telephone Encounter (Signed)
Can you please let the trulicity coupon up front for the patient.

## 2017-05-17 ENCOUNTER — Encounter: Payer: Self-pay | Admitting: Physician Assistant

## 2017-05-18 ENCOUNTER — Telehealth: Payer: Self-pay

## 2017-05-18 NOTE — Telephone Encounter (Signed)
Spoke with patient and advised that she can come into the office and pick up samples of Truilicty.

## 2017-05-21 ENCOUNTER — Encounter: Payer: Self-pay | Admitting: Physician Assistant

## 2017-05-26 NOTE — Telephone Encounter (Signed)
If we have any more trulicity pens could we please get for this patient as her insurance got messed up and she needs some to get her through the next month.  If we do not please let me know and I will call the rep and get the patient some samples.  Thanks

## 2017-05-29 ENCOUNTER — Other Ambulatory Visit: Payer: Self-pay | Admitting: Cardiology

## 2017-05-29 DIAGNOSIS — I5031 Acute diastolic (congestive) heart failure: Secondary | ICD-10-CM

## 2017-05-29 DIAGNOSIS — I10 Essential (primary) hypertension: Secondary | ICD-10-CM

## 2017-05-30 ENCOUNTER — Telehealth: Payer: Self-pay | Admitting: Physician Assistant

## 2017-05-30 NOTE — Telephone Encounter (Signed)
SARAH - I had John to review patient's medicine because I wasn't sure which trulicity sample to give the patient's husband.  It was the yellow box, with 2 pens in it.  The patient told her husband on his phone that it was supposed to be 4.  I couldn't find anyone at [facility] that knew anything about it.  Briona had called the patient, but she is not here. I finally spoke to [place] who said the box was in the refrigerator.  John checked the dosage and we gave her the one I mentioned above.  Please call her to clarify on how to proceed. [phone]

## 2017-05-30 NOTE — Telephone Encounter (Signed)
Patient called to inform the practice that the trulicity sample box that had been given to her only had one pen in it instead of the four that she was supposed to receive. She wanted me to inform the practice that the pens were also out of date by more than a year. (Expired 07/17).   The patient was very upset with the fact that she did not receive the quantity of samples she had been informed that she would receive and that the samples were also out of date.   She also requested that she be called by the Benny Lennert or at the very least someone who could directly help her with the issue.  Best Number [phone]

## 2017-05-31 NOTE — Telephone Encounter (Signed)
Please see note.

## 2017-05-31 NOTE — Telephone Encounter (Signed)
There are 2 boxes in the provider fridge as of yesterday for the patient.  Please let her know about this.  I have already contacted and talked with her about the expiration and only 1 pen through  mychart.

## 2017-06-04 NOTE — Telephone Encounter (Signed)
I have called and the drug company rep will bring by samples to help bridge the time from now until her insurance gets straight.

## 2017-06-07 ENCOUNTER — Other Ambulatory Visit: Payer: Self-pay | Admitting: Physician Assistant

## 2017-06-07 MED ORDER — DULAGLUTIDE 0.75 MG/0.5ML ~~LOC~~ SOAJ: 0.7500 mg | SUBCUTANEOUS | 0 refills | Status: DC

## 2017-06-07 MED ORDER — DULAGLUTIDE 0.75 MG/0.5ML ~~LOC~~ SOAJ: 0.7500 mg | SUBCUTANEOUS | 3 refills | Status: DC

## 2017-06-07 NOTE — Progress Notes (Signed)
Pt will come and get coupon for samples.   Spoke with patient.

## 2017-06-07 NOTE — Progress Notes (Signed)
Please call patient - I have trulicity samples are her - 0.75mg  - I also have a Rx for truliticy with a free month to use at the pharmacy - then the patient needs to go to [employer] for patient assistance to help with medication (do not tell them she might get insurance).  I have sent a 1 month supply for the coupon and a 3 months supply for the assistance program.

## 2017-07-01 ENCOUNTER — Other Ambulatory Visit: Payer: Self-pay | Admitting: Cardiology

## 2017-07-01 DIAGNOSIS — I10 Essential (primary) hypertension: Secondary | ICD-10-CM

## 2017-07-09 ENCOUNTER — Other Ambulatory Visit: Payer: Self-pay | Admitting: Physician Assistant

## 2017-07-09 ENCOUNTER — Encounter: Payer: Self-pay | Admitting: Physician Assistant

## 2017-07-09 MED ORDER — DULAGLUTIDE 0.75 MG/0.5ML ~~LOC~~ SOAJ: 0.7500 mg | SUBCUTANEOUS | 3 refills | Status: DC

## 2017-07-09 NOTE — Progress Notes (Signed)
Pt brought by form for [employer] for trulicty.

## 2017-07-14 ENCOUNTER — Other Ambulatory Visit: Payer: Self-pay | Admitting: Physician Assistant

## 2017-07-14 DIAGNOSIS — E1165 Type 2 diabetes mellitus with hyperglycemia: Secondary | ICD-10-CM

## 2017-07-14 DIAGNOSIS — Z794 Long term (current) use of insulin: Principal | ICD-10-CM

## 2017-07-17 ENCOUNTER — Other Ambulatory Visit: Payer: Self-pay | Admitting: Cardiology

## 2017-07-17 DIAGNOSIS — I10 Essential (primary) hypertension: Secondary | ICD-10-CM

## 2017-08-08 ENCOUNTER — Telehealth: Payer: Self-pay | Admitting: Physician Assistant

## 2017-08-08 NOTE — Telephone Encounter (Signed)
Pt came by to pick up Trulicity.

## 2017-08-14 ENCOUNTER — Other Ambulatory Visit: Payer: Self-pay | Admitting: Physician Assistant

## 2017-08-14 DIAGNOSIS — Z794 Long term (current) use of insulin: Principal | ICD-10-CM

## 2017-08-14 DIAGNOSIS — E1165 Type 2 diabetes mellitus with hyperglycemia: Secondary | ICD-10-CM

## 2017-09-17 ENCOUNTER — Other Ambulatory Visit: Payer: Self-pay | Admitting: Physician Assistant

## 2017-09-17 DIAGNOSIS — Z794 Long term (current) use of insulin: Principal | ICD-10-CM

## 2017-09-17 DIAGNOSIS — E1165 Type 2 diabetes mellitus with hyperglycemia: Secondary | ICD-10-CM

## 2017-09-27 ENCOUNTER — Ambulatory Visit: Payer: BLUE CROSS/BLUE SHIELD | Admitting: Physician Assistant

## 2017-09-27 ENCOUNTER — Other Ambulatory Visit: Payer: Self-pay

## 2017-09-27 ENCOUNTER — Encounter: Payer: Self-pay | Admitting: Physician Assistant

## 2017-09-27 VITALS — BP 136/74 | HR 96 | Temp 98.6°F | Resp 18 | Ht 63.0 in | Wt 252.6 lb

## 2017-09-27 DIAGNOSIS — E1165 Type 2 diabetes mellitus with hyperglycemia: Secondary | ICD-10-CM

## 2017-09-27 DIAGNOSIS — I10 Essential (primary) hypertension: Secondary | ICD-10-CM | POA: Diagnosis not present

## 2017-09-27 DIAGNOSIS — N182 Chronic kidney disease, stage 2 (mild): Secondary | ICD-10-CM | POA: Diagnosis not present

## 2017-09-27 DIAGNOSIS — I5031 Acute diastolic (congestive) heart failure: Secondary | ICD-10-CM

## 2017-09-27 DIAGNOSIS — Z794 Long term (current) use of insulin: Secondary | ICD-10-CM | POA: Diagnosis not present

## 2017-09-27 DIAGNOSIS — I5032 Chronic diastolic (congestive) heart failure: Secondary | ICD-10-CM | POA: Diagnosis not present

## 2017-09-27 DIAGNOSIS — L989 Disorder of the skin and subcutaneous tissue, unspecified: Secondary | ICD-10-CM

## 2017-09-27 DIAGNOSIS — I110 Hypertensive heart disease with heart failure: Secondary | ICD-10-CM | POA: Diagnosis not present

## 2017-09-27 MED ORDER — FOLIC ACID 1 MG PO TABS: 1.0000 mg | ORAL_TABLET | Freq: Every day | ORAL | 4 refills | Status: DC

## 2017-09-27 MED ORDER — METFORMIN HCL 1000 MG PO TABS: 1000.0000 mg | ORAL_TABLET | Freq: Two times a day (BID) | ORAL | 0 refills | Status: DC

## 2017-09-27 MED ORDER — CARVEDILOL 25 MG PO TABS: ORAL_TABLET | ORAL | 1 refills | Status: DC

## 2017-09-27 MED ORDER — SPIRONOLACTONE 25 MG PO TABS: 25.0000 mg | ORAL_TABLET | Freq: Every day | ORAL | 3 refills | Status: DC

## 2017-09-27 MED ORDER — HYDRALAZINE HCL 25 MG PO TABS: 25.0000 mg | ORAL_TABLET | Freq: Three times a day (TID) | ORAL | 1 refills | Status: DC

## 2017-09-27 MED ORDER — AMLODIPINE BESYLATE 10 MG PO TABS: ORAL_TABLET | ORAL | 1 refills | Status: DC

## 2017-09-27 MED ORDER — FUROSEMIDE 20 MG PO TABS: 20.0000 mg | ORAL_TABLET | Freq: Every day | ORAL | 3 refills | Status: DC

## 2017-09-27 MED ORDER — LOSARTAN POTASSIUM 100 MG PO TABS: 100.0000 mg | ORAL_TABLET | Freq: Every day | ORAL | 1 refills | Status: DC

## 2017-09-27 NOTE — Patient Instructions (Addendum)
Losartan affected by the recall are 100 milligram/25 milligram tablets with the lot number [id number].   Make an appt with an eye doctor    IF you received an x-ray today, you will receive an invoice from [facility]. Please contact [facility] at [phone] with questions or concerns regarding your invoice.   IF you received labwork today, you will receive an invoice from [facility]. Please contact [facility] at [phone] with questions or concerns regarding your invoice.   Our billing staff will not be able to assist you with questions regarding bills from these companies.  You will be contacted with the lab results as soon as they are available. The fastest way to get your results is to activate your My Chart account. Instructions are located on the last page of this paperwork. If you have not heard from us regarding the results in 2 weeks, please contact this office.

## 2017-09-27 NOTE — Progress Notes (Signed)
Brittany Lopez  MRN: [id number] DOB: [birth year]  PCP: Mancel Bale, PA-C  Chief Complaint  Patient presents with  . Hypertension    follow up  . Diabetes    follow up     Subjective:  Pt presents to clinic for recheck of DM and HTN. She has been doing good.  She has noticed a decrease in her eating since the trulicity.  She got patient assistance which has allowed her to continue this medications.  Checking her glucose at hone - 90-low 100s - feeling good.  She has a non-painful nodule on her right leg right above her knee - it has not changed in size - she has had something similar in the past and had it removed due to nerve pain - this was does not bother her other than she knows it is there.  She can only feel it in certain leg positions.  History is obtained by patient.  Review of Systems  Constitutional: Negative for chills and fever.  Eyes: Negative for visual disturbance.  Respiratory: Negative for shortness of breath.   Cardiovascular: Negative for chest pain, palpitations and leg swelling.  Skin: Negative for wound.  Neurological: Negative for dizziness, light-headedness, numbness and headaches.    Patient Active Problem List   Diagnosis Date Noted  . CKD (chronic kidney disease), stage II 01/08/2017  . Hypertensive heart disease with CHF (congestive heart failure) ([place]) 01/08/2017  . Iron deficiency anemia 11/24/2014  . Sickle cell trait ([place]) 11/08/2014  . Microalbuminuria 11/08/2014  . Chronic diastolic CHF (congestive heart failure), NYHA class 2 ([place]) 08/23/2014  . Morbid obesity ([place]) 08/23/2014  . Insulin dependent type 2 diabetes mellitus, controlled ([place]) 03/30/2014  . Essential hypertension 03/30/2014    Current Outpatient Medications on File Prior to Visit  Medication Sig Dispense Refill  . Dulaglutide (TRULICITY) 2.63 ZC/5.8IF SOPN Inject 0.75 mg into the skin once a week. 12 pen 3   No current facility-administered medications on file prior to  visit.     No Known Allergies  Past Medical History:  Diagnosis Date  . Chronic diastolic CHF (congestive heart failure) ([place])   . CKD (chronic kidney disease), stage II   . Diabetes mellitus without complication ([place])   . Hyperlipidemia   . Hypertension   . Hypertensive heart disease with CHF (congestive heart failure) ([place]) 01/08/2017  . Microcytic anemia   . Morbid obesity ([place])   . 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 Is currently looking for a job.      Lives with husband and daugheter who is 35.      Seat belt 100% of time.   Social History   Tobacco Use  . Smoking status: Never Smoker  . Smokeless tobacco: Never Used  Substance Use Topics  . Alcohol use: Yes    Comment: socially, 1x month  . Drug use: No   family history includes Diabetes in her mother; Heart disease in her mother; Hypertension in her mother; Sjogren's syndrome in her mother; Stroke in her mother.     Objective:  BP 136/74   Pulse 96   Temp 98.6 F (37 C) (Oral)   Resp 18   Ht _0  (1.6 m)   Wt 252 lb 9.6 oz (114.6 kg)   LMP 09/21/2017   SpO2 100%   BMI 44.75 kg/m  Body  mass index is 44.75 kg/m.  Physical Exam  Constitutional: She is oriented to person, place, and time and well-developed, well-nourished, and in no distress.  HENT:  Head: Normocephalic and atraumatic.  Right Ear: Hearing and external ear normal.  Left Ear: Hearing and external ear normal.  Eyes: Conjunctivae are normal.  Neck: Normal range of motion.  Cardiovascular: Normal rate, regular rhythm and normal heart sounds.  No murmur heard. Pulmonary/Chest: Effort normal and breath sounds normal.  Musculoskeletal:       Right lower leg: She exhibits no edema.       Left lower leg: She exhibits no edema.  Neurological: She is alert and oriented to person, place, and time. Gait normal.    Skin: Skin is warm and dry.  1cm non-tender firm nodule - without any skin changes  Psychiatric: Mood, memory, affect and judgment normal.  Vitals reviewed.   Assessment and Plan :  Type 2 diabetes mellitus with hyperglycemia, with long-term current use of insulin (HCC) - Plan: CMP14+EGFR, Hemoglobin A1c, metFORMIN (GLUCOPHAGE) 3832 MG tablet, folic acid (FOLVITE) 1 MG tablet  Essential hypertension - Plan: CMP14+EGFR, carvedilol (COREG) 25 MG tablet, amLODipine (NORVASC) 10 MG tablet, spironolactone (ALDACTONE) 25 MG tablet, furosemide (LASIX) 20 MG tablet, losartan (COZAAR) 100 MG tablet, hydrALAZINE (APRESOLINE) 25 MG tablet - good control of HTN today  Chronic diastolic CHF (congestive heart failure), NYHA class 2 (HCC) - Plan: spironolactone (ALDACTONE) 25 MG tablet, furosemide (LASIX) 20 MG tablet  Morbid obesity (HCC) - some weight loss - expect this to continue with no insulin and decrease eating due to trulicity as she has become aware of not overeating.  Uncontrolled hypertension - Plan: carvedilol (COREG) 25 MG tablet, amLODipine (NORVASC) 10 MG tablet  Hypertensive heart disease with congestive heart failure, unspecified heart failure type ([place]) - Plan: spironolactone (ALDACTONE) 25 MG tablet, furosemide (LASIX) 20 MG tablet  CKD (chronic kidney disease), stage II - Plan: spironolactone (ALDACTONE) 25 MG tablet, furosemide (LASIX) 20 MG tablet  Acute diastolic CHF (congestive heart failure), NYHA class 3 (HCC) - Plan: losartan (COZAAR) 100 MG tablet  Lesion/mass of leg - suspect lipoma as she has had one before and without skin changes to suspect a sebaceous cyst - pt will watch and wait and if it changes she will let me know for further evaluation can be done.  Windell Hummingbird PA-C  Primary Care at [facility] 09/27/2017 12:33 PM

## 2017-09-28 LAB — CMP14+EGFR
ALK PHOS: 72 IU/L (ref 39–117)
ALT: 7 IU/L (ref 0–32)
AST: 10 IU/L (ref 0–40)
Albumin/Globulin Ratio: 1.3 (ref 1.2–2.2)
Albumin: 4.4 g/dL (ref 3.5–5.5)
BILIRUBIN TOTAL: 0.2 mg/dL (ref 0.0–1.2)
BUN/Creatinine Ratio: 15 (ref 9–23)
BUN: 21 mg/dL (ref 6–24)
CHLORIDE: 106 mmol/L (ref 96–106)
CO2: 16 mmol/L — ABNORMAL LOW (ref 20–29)
Calcium: 9.8 mg/dL (ref 8.7–10.2)
Creatinine, Ser: 1.44 mg/dL — ABNORMAL HIGH (ref 0.57–1.00)
GFR calc Af Amer: 52 mL/min/{1.73_m2} — ABNORMAL LOW (ref >59)
GFR calc non Af Amer: 45 mL/min/{1.73_m2} — ABNORMAL LOW (ref >59)
GLUCOSE: 101 mg/dL — AB (ref 65–99)
Globulin, Total: 3.4 g/dL (ref 1.5–4.5)
Potassium: 4.9 mmol/L (ref 3.5–5.2)
Sodium: 140 mmol/L (ref 134–144)
TOTAL PROTEIN: 7.8 g/dL (ref 6.0–8.5)

## 2017-09-28 LAB — HEMOGLOBIN A1C
ESTIMATED AVERAGE GLUCOSE: 108 mg/dL
HEMOGLOBIN A1C: 5.4 % (ref 4.8–5.6)

## 2017-11-09 ENCOUNTER — Encounter: Payer: Self-pay | Admitting: Physician Assistant

## 2017-11-13 ENCOUNTER — Encounter: Payer: Self-pay | Admitting: Physician Assistant

## 2017-12-18 ENCOUNTER — Other Ambulatory Visit: Payer: Self-pay | Admitting: Physician Assistant

## 2017-12-18 DIAGNOSIS — I10 Essential (primary) hypertension: Secondary | ICD-10-CM

## 2017-12-22 ENCOUNTER — Encounter: Payer: Self-pay | Admitting: Physician Assistant

## 2018-01-17 ENCOUNTER — Other Ambulatory Visit: Payer: Self-pay | Admitting: Cardiology

## 2018-01-17 DIAGNOSIS — I10 Essential (primary) hypertension: Secondary | ICD-10-CM

## 2018-01-17 NOTE — Telephone Encounter (Signed)
Order Providers   Prescribing Provider Encounter Provider  Willette Brace, PA-C  Supervision Information   Supervising Provider Type of Supervision  Shade Flood, MD Supervision Required  Outpatient Medication Detail    Disp Refills Start End   amLODipine (NORVASC) 10 MG tablet 90 tablet 1 09/27/2017    Sig: TAKE 1 TABLET(10 MG) BY MOUTH DAILY   Sent to pharmacy as: amLODipine (NORVASC) 10 MG tablet   E-Prescribing Status: Receipt confirmed by pharmacy (09/27/2017 12:14 PM EST)   Associated Diagnoses   Uncontrolled hypertension     Essential hypertension     Pharmacy   [id number] - [place],  - [address] AT [facility] OF [address]

## 2018-02-25 ENCOUNTER — Other Ambulatory Visit: Payer: Self-pay | Admitting: Cardiology

## 2018-02-25 DIAGNOSIS — I10 Essential (primary) hypertension: Secondary | ICD-10-CM

## 2018-02-25 DIAGNOSIS — I110 Hypertensive heart disease with heart failure: Secondary | ICD-10-CM

## 2018-02-25 DIAGNOSIS — N182 Chronic kidney disease, stage 2 (mild): Secondary | ICD-10-CM

## 2018-02-25 DIAGNOSIS — I5032 Chronic diastolic (congestive) heart failure: Secondary | ICD-10-CM

## 2018-03-18 ENCOUNTER — Other Ambulatory Visit: Payer: Self-pay | Admitting: Cardiology

## 2018-03-25 ENCOUNTER — Other Ambulatory Visit: Payer: Self-pay | Admitting: Physician Assistant

## 2018-03-25 DIAGNOSIS — I10 Essential (primary) hypertension: Secondary | ICD-10-CM

## 2018-03-27 ENCOUNTER — Other Ambulatory Visit: Payer: Self-pay | Admitting: Physician Assistant

## 2018-03-27 DIAGNOSIS — I5031 Acute diastolic (congestive) heart failure: Secondary | ICD-10-CM

## 2018-03-27 DIAGNOSIS — I10 Essential (primary) hypertension: Secondary | ICD-10-CM

## 2018-04-14 ENCOUNTER — Other Ambulatory Visit: Payer: Self-pay | Admitting: Cardiology

## 2018-04-26 ENCOUNTER — Other Ambulatory Visit: Payer: Self-pay | Admitting: Physician Assistant

## 2018-04-26 DIAGNOSIS — I5031 Acute diastolic (congestive) heart failure: Secondary | ICD-10-CM

## 2018-04-26 DIAGNOSIS — I10 Essential (primary) hypertension: Secondary | ICD-10-CM

## 2018-04-28 NOTE — Telephone Encounter (Signed)
Interface request for refill.      Losartan refill Last Refill:100mg  # 30 Last OV: 09/27/17 PCP: Benny LennertSarah Weber Pharmacy:[facility] [phone]

## 2018-04-29 ENCOUNTER — Telehealth: Payer: Self-pay | Admitting: Cardiology

## 2018-04-29 ENCOUNTER — Other Ambulatory Visit: Payer: Self-pay

## 2018-04-29 MED ORDER — HYDRALAZINE HCL 25 MG PO TABS: 25.0000 mg | ORAL_TABLET | Freq: Three times a day (TID) | ORAL | 0 refills | Status: DC

## 2018-04-29 NOTE — Telephone Encounter (Signed)
New Message:   *STAT* If patient is at the pharmacy, call can be transferred to refill team.   1. Which medications need to be refilled? (please list name of each medication and dose if known) hydrALAZINE (APRESOLINE) 25 MG tablet  2. Which pharmacy/location (including street and city if local pharmacy) is medication to be sent to?     [id number] - [place], [place] - [address] AT [facility] [address]      3. Do they need a 30 day or 90 day supply? 30

## 2018-05-06 ENCOUNTER — Ambulatory Visit (INDEPENDENT_AMBULATORY_CARE_PROVIDER_SITE_OTHER): Payer: BLUE CROSS/BLUE SHIELD | Admitting: Physician Assistant

## 2018-05-06 ENCOUNTER — Encounter: Payer: Self-pay | Admitting: Physician Assistant

## 2018-05-06 VITALS — BP 160/88 | HR 71 | Ht 63.0 in | Wt 278.8 lb

## 2018-05-06 DIAGNOSIS — I5032 Chronic diastolic (congestive) heart failure: Secondary | ICD-10-CM

## 2018-05-06 DIAGNOSIS — I10 Essential (primary) hypertension: Secondary | ICD-10-CM

## 2018-05-06 MED ORDER — SPIRONOLACTONE 50 MG PO TABS: 50.0000 mg | ORAL_TABLET | Freq: Every day | ORAL | 3 refills | Status: DC

## 2018-05-06 NOTE — Patient Instructions (Addendum)
Medication Instructions:  Your physician has recommended you make the following change in your medication:  1.  INCREASE Spironolactone to 50 mg daily  Labwork: TODAY:  BMET 3-4 WEEKS:  BMET  Testing/Procedures: None ordered  Follow-Up: Your physician wants you to follow-up in:  5 MONTHS WITH DR. Delton SeeNELSON ONLY You will receive a reminder letter in the mail two months in advance. If you don't receive a letter, please call our office to schedule the follow-up appointment.    Any Other Special Instructions Will Be Listed Below (If Applicable).     If you need a refill on your cardiac medications before your next appointment, please call your pharmacy.

## 2018-05-06 NOTE — Progress Notes (Signed)
Cardiology Office Note    Date:  05/06/2018   ID:  Brittany Lopez, DOB 11-Mar-1976, MRN [id number]  PCP:  Mancel Bale, PA-C  Cardiologist: Ena Dawley, MD  Chief Complaint  Patient presents with  . Follow-up    History of Present Illness:  Brittany Lopez is a 42 y.o. female with history of hypertension, DM, morbid obesity, chronic diastolic CHF, CKD, sickle cell trait.  She has had difficult to control hypertension and was last seen in our office by Fuller Canada, her pH.  At that time she was on amlodipine 10 mg daily carvedilol 25 mg twice daily, furosemide 20 mg daily losartan 100 mg daily spironolactone 25 mg daily and hydralazine 25 mg 3 times daily.  She was asked to follow-up in 3 months. 2D echo 2015 LVEF 50 to 55% with mild LVH, grade 1 DD.  Patient comes in for f/u. She says her BP was 128/80 this am. Usually runs 130-140/80.  She is a low-sodium diet.  Has not been exercising because of her recent knee injury.  She denies chest pain, palpitations, dyspnea, dyspnea on exertion, dizziness or presyncope.  Past Medical History:  Diagnosis Date  . Chronic diastolic CHF (congestive heart failure) ([place])   . CKD (chronic kidney disease), stage II   . Diabetes mellitus without complication ([place])   . Hyperlipidemia   . Hypertension   . Hypertensive heart disease with CHF (congestive heart failure) ([place]) 01/08/2017  . Microcytic anemia   . Morbid obesity ([place])   . Sickle cell trait [facility])     Past Surgical History:  Procedure Laterality Date  . CESAREAN SECTION      Current Medications: Current Meds  Medication Sig  . amLODipine (NORVASC) 10 MG tablet TAKE 1 TABLET(10 MG) BY MOUTH DAILY  . carvedilol (COREG) 25 MG tablet TAKE 1 TABLET(25 MG) BY MOUTH TWICE DAILY WITH A MEAL  . folic acid (FOLVITE) 1 MG tablet Take 1 tablet (1 mg total) by mouth daily.  . furosemide (LASIX) 20 MG tablet Take 1 tablet (20 mg total) by mouth daily. Please make overdue appt for Dr.  Meda Coffee before anymore refills. 1st attempt.  . hydrALAZINE (APRESOLINE) 25 MG tablet Take 1 tablet (25 mg total) by mouth 3 (three) times daily. Please keep upcoming appointment for further refills, thanks!  Marland Kitchen losartan (COZAAR) 100 MG tablet TAKE 1 TABLET BY MOUTH DAILY     Allergies:   Patient has no known allergies.   Social History   Socioeconomic History  . Marital status: Married    Spouse name: [place]   . Number of children: Not on file  . Years of education: Not on file  . Highest education level: Not on file  Occupational History  . Not on file  Social Needs  . Financial resource strain: Not on file  . Food insecurity:    Worry: Not on file    Inability: Not on file  . Transportation needs:    Medical: Not on file    Non-medical: Not on file  Tobacco Use  . Smoking status: Never Smoker  . Smokeless tobacco: Never Used  Substance and Sexual Activity  . Alcohol use: Yes    Comment: socially, 1x month  . Drug use: No  . Sexual activity: Yes    Birth control/protection: None  Lifestyle  . Physical activity:    Days per week: Not on file    Minutes per session: Not on file  . Stress: Not  on file  Relationships  . Social connections:    Talks on phone: Not on file    Gets together: Not on file    Attends religious service: Not on file    Active member of club or organization: Not on file    Attends meetings of clubs or organizations: Not on file    Relationship status: Not on file  Other Topics Concern  . 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 Is currently looking for a job.      Lives with husband and daugheter who is 79.      Seat belt 100% of time.     Family History:  The patient's family history includes Diabetes in her mother; Heart disease in her mother; Hypertension in her mother; Sjogren's syndrome in her mother; Stroke in her mother.   ROS:   Please  see the history of present illness.    Review of Systems  Constitution: Negative.  HENT: Negative.   Eyes: Negative.   Cardiovascular: Negative.   Respiratory: Negative.   Hematologic/Lymphatic: Negative.   Musculoskeletal: Negative.  Negative for joint pain.  Gastrointestinal: Negative.   Genitourinary: Negative.   Neurological: Negative.    All other systems reviewed and are negative.   PHYSICAL EXAM:   VS:  BP (!) 160/88   Pulse 71   Ht '5\' 3"'  (1.6 m)   Wt 278 lb 12.8 oz (126.5 kg)   BMI 49.39 kg/m   Physical Exam  GEN: Obese, in no acute distress  Neck: no JVD, carotid bruits, or masses Cardiac:RRR; positive S4 Respiratory:  clear to auscultation bilaterally, normal work of breathing GI: soft, nontender, nondistended, + BS Ext: without cyanosis, clubbing, or edema, Good distal pulses bilaterally Neuro:  Alert and Oriented x 3 Psych: euthymic mood, full affect  Wt Readings from Last 3 Encounters:  05/06/18 278 lb 12.8 oz (126.5 kg)  09/27/17 252 lb 9.6 oz (114.6 kg)  04/19/17 255 lb 9.6 oz (115.9 kg)      Studies/Labs Reviewed:   EKG:  EKG is  ordered today.  The ekg ordered today demonstrates normal sinus rhythm, poor R wave progression anteriorly otherwise normal.  No acute change  Recent Labs: 09/27/2017: ALT 7; BUN 21; Creatinine, Ser 1.44; Potassium 4.9; Sodium 140   Lipid Panel    Component Value Date/Time   CHOL 158 04/19/2017 1049   TRIG 67 04/19/2017 1049   HDL 46 04/19/2017 1049   CHOLHDL 3.4 04/19/2017 1049   CHOLHDL 3.8 05/24/2010 0440   VLDL 18 05/24/2010 0440   LDLCALC 99 04/19/2017 1049    Additional studies/ records that were reviewed today include:   2D echo 2015 Study Conclusions  - Left ventricle: The cavity size was normal. Wall thickness was   increased in a pattern of mild LVH. Systolic function was normal.   The estimated ejection fraction was in the range of 50% to 55%.   Cannot exclude hypokinesis of the basalinferior  myocardium.   Doppler parameters are consistent with abnormal left ventricular   relaxation (grade 1 diastolic dysfunction). - Aortic valve: Probably trileaflet. Appears to be an area of   moderate calcification involving the noncoronary or left coronary   cusp and adjacent annulus. Not well seen, cannot completely   exclude a calcified vegetation or mass. This could be further   visualized by TEE, if felt to be clinically appropriate. Cusp  separation was normal. There was no significant regurgitation. - Left atrium: The atrium was at the upper limits of normal in   size. - Right atrium: Central venous pressure (est): 3 mm Hg. - Tricuspid valve: There was trivial regurgitation. - Pulmonary arteries: Systolic pressure could not be accurately   estimated. - Pericardium, extracardiac: There was no pericardial effusion.  Impressions:  - Limited images overall. Mild LVH with LVEF approximately 50-55%,   cannot exclude inferior hypokinesis, grade 1 diastolic   dysfunction. Appears to be an area of moderate calcification   involving the noncoronary or left coronary cusp and adjacent   annulus. Not well seen, cannot completely exclude a calcified   vegetation or mass. This could be further visualized by TEE, if   felt to be clinically appropriate. Trivial tricuspid   regurgitation, unable to assess PASP.   ASSESSMENT:    1. Essential hypertension   2. Chronic diastolic CHF (congestive heart failure), NYHA class 2 ([place])   3. Morbid obesity ([place])      PLAN:  In order of problems listed above:  Essential hypertension patient's blood pressure is elevated today but overall has been stable per her readings.  Creatinine was up to 1.4 back in January when checked by PCP.  Will increase Spironolactone to 50 mg once daily.  Check be met today and again in 1 month.  We will have to readjust if her creatinine is still up.  Chronic diastolic CHF compensated  Morbid obesity exercise and  weight loss program discussed with patient    Medication Adjustments/Labs and Tests Ordered: Current medicines are reviewed at length with the patient today.  Concerns regarding medicines are outlined above.  Medication changes, Labs and Tests ordered today are listed in the Patient Instructions below. Patient Instructions  Medication Instructions:  Your physician has recommended you make the following change in your medication:  1.  INCREASE Spironolactone to 50 mg daily  Labwork: TODAY:  BMET 3-4 WEEKS:  BMET  Testing/Procedures: None ordered  Follow-Up: Your physician wants you to follow-up in:  [place] will receive a reminder letter in the mail two months in advance. If you don't receive a letter, please call our office to schedule the follow-up appointment.    Any Other Special Instructions Will Be Listed Below (If Applicable).     If you need a refill on your cardiac medications before your next appointment, please call your pharmacy.      Signed, Ermalinda Barrios, PA-C  05/06/2018 1:44 PM    [facility] [place], [place], [place]  [address] Phone: [phone]; Fax: [phone]

## 2018-05-07 LAB — BASIC METABOLIC PANEL
BUN / CREAT RATIO: 14 (ref 9–23)
BUN: 18 mg/dL (ref 6–24)
CALCIUM: 9.4 mg/dL (ref 8.7–10.2)
CHLORIDE: 106 mmol/L (ref 96–106)
CO2: 22 mmol/L (ref 20–29)
Creatinine, Ser: 1.32 mg/dL — ABNORMAL HIGH (ref 0.57–1.00)
GFR, EST AFRICAN AMERICAN: 57 mL/min/{1.73_m2} — AB (ref >59)
GFR, EST NON AFRICAN AMERICAN: 50 mL/min/{1.73_m2} — AB (ref >59)
Glucose: 95 mg/dL (ref 65–99)
POTASSIUM: 4.8 mmol/L (ref 3.5–5.2)
Sodium: 144 mmol/L (ref 134–144)

## 2018-05-09 ENCOUNTER — Other Ambulatory Visit: Payer: Self-pay | Admitting: *Deleted

## 2018-05-09 MED ORDER — HYDRALAZINE HCL 25 MG PO TABS: 25.0000 mg | ORAL_TABLET | Freq: Three times a day (TID) | ORAL | 3 refills | Status: DC

## 2018-05-27 ENCOUNTER — Other Ambulatory Visit: Payer: Self-pay | Admitting: *Deleted

## 2018-05-27 ENCOUNTER — Other Ambulatory Visit: Payer: Self-pay | Admitting: Physician Assistant

## 2018-05-27 DIAGNOSIS — I10 Essential (primary) hypertension: Secondary | ICD-10-CM

## 2018-05-27 DIAGNOSIS — I5031 Acute diastolic (congestive) heart failure: Secondary | ICD-10-CM

## 2018-05-27 MED ORDER — LOSARTAN POTASSIUM 100 MG PO TABS: 100.0000 mg | ORAL_TABLET | Freq: Every day | ORAL | 0 refills | Status: DC

## 2018-05-27 NOTE — Telephone Encounter (Signed)
Pt's last office visit 09/27/17; contacted pt to schedule appointment; she states that cardiology is managing this medication with appointment on 06/03/18, and she needs enough medication to cover her unil her appointment with Cardiology; pt offered and accepted appointment with Dr Koren ShiverIrma Lopez, [place] [address], 06/20/18 at 1140; she verbalizes understanding.

## 2018-06-03 ENCOUNTER — Other Ambulatory Visit: Payer: BLUE CROSS/BLUE SHIELD

## 2018-06-03 DIAGNOSIS — I5032 Chronic diastolic (congestive) heart failure: Secondary | ICD-10-CM

## 2018-06-03 DIAGNOSIS — I10 Essential (primary) hypertension: Secondary | ICD-10-CM

## 2018-06-03 LAB — BASIC METABOLIC PANEL
BUN/Creatinine Ratio: 15 (ref 9–23)
BUN: 21 mg/dL (ref 6–24)
CALCIUM: 9.1 mg/dL (ref 8.7–10.2)
CO2: 19 mmol/L — ABNORMAL LOW (ref 20–29)
CREATININE: 1.38 mg/dL — AB (ref 0.57–1.00)
Chloride: 106 mmol/L (ref 96–106)
GFR, EST AFRICAN AMERICAN: 54 mL/min/{1.73_m2} — AB (ref >59)
GFR, EST NON AFRICAN AMERICAN: 47 mL/min/{1.73_m2} — AB (ref >59)
Glucose: 123 mg/dL — ABNORMAL HIGH (ref 65–99)
POTASSIUM: 4.4 mmol/L (ref 3.5–5.2)
Sodium: 139 mmol/L (ref 134–144)

## 2018-06-04 ENCOUNTER — Telehealth: Payer: Self-pay | Admitting: Physician Assistant

## 2018-06-04 DIAGNOSIS — I10 Essential (primary) hypertension: Secondary | ICD-10-CM

## 2018-06-04 DIAGNOSIS — I5032 Chronic diastolic (congestive) heart failure: Secondary | ICD-10-CM

## 2018-06-04 DIAGNOSIS — N182 Chronic kidney disease, stage 2 (mild): Secondary | ICD-10-CM

## 2018-06-04 NOTE — Telephone Encounter (Signed)
-----   Message from Dyann Kief, PA-C sent at 06/04/2018  7:50 AM EDT ----- Kidney function up a little but similar. No changes today. Repeat in 6 weeks

## 2018-06-04 NOTE — Telephone Encounter (Signed)
New message   Patient is returning your call about labs.

## 2018-06-04 NOTE — Telephone Encounter (Signed)
Called an made patient aware of lab results and recommendations to repeat BMET in 6 weeks. Patient verbalized understanding and thanked me for the call. Lab appointment made for 11/8.

## 2018-06-20 ENCOUNTER — Other Ambulatory Visit: Payer: Self-pay

## 2018-06-20 ENCOUNTER — Encounter: Payer: Self-pay | Admitting: Family Medicine

## 2018-06-20 ENCOUNTER — Ambulatory Visit: Payer: BLUE CROSS/BLUE SHIELD | Admitting: Family Medicine

## 2018-06-20 VITALS — BP 149/86 | HR 79 | Temp 98.0°F | Resp 18 | Ht 63.0 in | Wt 281.2 lb

## 2018-06-20 DIAGNOSIS — N182 Chronic kidney disease, stage 2 (mild): Secondary | ICD-10-CM

## 2018-06-20 DIAGNOSIS — D509 Iron deficiency anemia, unspecified: Secondary | ICD-10-CM | POA: Diagnosis not present

## 2018-06-20 DIAGNOSIS — Z794 Long term (current) use of insulin: Secondary | ICD-10-CM

## 2018-06-20 DIAGNOSIS — I10 Essential (primary) hypertension: Secondary | ICD-10-CM | POA: Diagnosis not present

## 2018-06-20 DIAGNOSIS — E1165 Type 2 diabetes mellitus with hyperglycemia: Secondary | ICD-10-CM

## 2018-06-20 DIAGNOSIS — R946 Abnormal results of thyroid function studies: Secondary | ICD-10-CM

## 2018-06-20 MED ORDER — SITAGLIPTIN PHOSPHATE 100 MG PO TABS: 100.0000 mg | ORAL_TABLET | Freq: Every day | ORAL | 0 refills | Status: DC

## 2018-06-20 NOTE — Patient Instructions (Addendum)
  Please make an appointment with your eye doctor   If you have lab work done today you will be contacted with your lab results within the next 2 weeks.  If you have not heard from [place] then please contact us. The fastest way to get your results is to register for My Chart.   IF you received an x-ray today, you will receive an invoice from [facility]. Please contact [facility] at [phone] with questions or concerns regarding your invoice.   IF you received labwork today, you will receive an invoice from [facility]. Please contact [facility] at [phone] with questions or concerns regarding your invoice.   Our billing staff will not be able to assist you with questions regarding bills from these companies.  You will be contacted with the lab results as soon as they are available. The fastest way to get your results is to activate your My Chart account. Instructions are located on the last page of this paperwork. If you have not heard from [place] regarding the results in 2 weeks, please contact this office.

## 2018-06-20 NOTE — Progress Notes (Signed)
10/11/201911:39 AM  Humberto Leep Brisby 12/30/1975, 42 y.o. female [id number]  Chief Complaint  Patient presents with  . Medication Refill    all medication  . Hypertension    check on her bp; states she takes her meds every day    HPI:   Patient is a 42 y.o. female with past medical history significant for HTN with CHF, DM2, CKD2, iron def anemia who presents today for routine followup  Previous PCP Benny Lennert, PA-C Last cards appt in Aug, spironolactone increased to 50mg  Used to be on trulicity but caused horrible constipation and very expensive Has started back on insulin novolin 70/30 23 units BID Checks cbgs occasionally, highest 120, lowest 80 Does not get symptoms when in the 80s Has noticed weight gain since 70/30  She was stopped metformin for unclear reasons Last crt 1.38, GFR 54  Checks bp at home, earlier in the day, 120-130/80s When she checks later in the day it is around 150/80s Takes amlodipine, losartan, spironolactone, furosemide, hydralazine, carvedilol in AM Then takes another carvedilol and hydralazine in the evening  Hydralazine before bed Follows low salt diet Has been evaluated for sleep apnea, feels that she did not have a good study  She goes to eye care center at [facility], she will make an appt She needs to schedule her gyn exam, pap  Lab Results  Component Value Date   HGBA1C 5.4 09/27/2017   HGBA1C 5.4 04/19/2017   HGBA1C 5.2 12/22/2016   Lab Results  Component Value Date   MICROALBUR 126.5 03/09/2016   LDLCALC 99 04/19/2017   CREATININE 1.38 (H) 06/03/2018    Patient Care Team: Patient, No Pcp Per as PCP - General (General Practice) Lars Masson, MD as PCP - Cardiology (Cardiology) Lars Masson, MD as Consulting Physician (Cardiology)   Fall Risk  06/20/2018 09/27/2017 04/19/2017 12/22/2016 03/09/2016  Falls in the past year? No No No No No     Depression screen [facility] 2/9 06/20/2018 09/27/2017 04/19/2017  Decreased  Interest 0 0 0  Down, Depressed, Hopeless 0 0 0  PHQ - 2 Score 0 0 0    No Known Allergies  Prior to Admission medications   Medication Sig Start Date End Date Taking? Authorizing Provider  amLODipine (NORVASC) 10 MG tablet TAKE 1 TABLET(10 MG) BY MOUTH DAILY 09/27/17  Yes Weber, Sarah L, PA-C  carvedilol (COREG) 25 MG tablet TAKE 1 TABLET(25 MG) BY MOUTH TWICE DAILY WITH A MEAL 03/25/18  Yes Weber, Sarah L, PA-C  folic acid (FOLVITE) 1 MG tablet Take 1 tablet (1 mg total) by mouth daily. 09/27/17  Yes Weber, Sarah L, PA-C  furosemide (LASIX) 20 MG tablet Take 1 tablet (20 mg total) by mouth daily. Please make overdue appt for Dr. Delton See before anymore refills. 1st attempt. 02/25/18  Yes Lars Masson, MD  hydrALAZINE (APRESOLINE) 25 MG tablet Take 1 tablet (25 mg total) by mouth 3 (three) times daily. 05/09/18  Yes Lars Masson, MD  losartan (COZAAR) 100 MG tablet Take 1 tablet (100 mg total) by mouth daily. 05/27/18  Yes Myles Lipps, MD  spironolactone (ALDACTONE) 50 MG tablet Take 1 tablet (50 mg total) by mouth daily. 05/06/18  Yes Dyann Kief, PA-C  furosemide (LASIX) 20 MG tablet Take 1 tablet (20 mg total) by mouth daily. 09/27/17 12/26/17  Morrell Riddle, PA-C    Past Medical History:  Diagnosis Date  . Chronic diastolic CHF (congestive heart failure) ([facility])   .  CKD (chronic kidney disease), stage II   . Diabetes mellitus without complication ([facility])   . Hyperlipidemia   . Hypertension   . Hypertensive heart disease with CHF (congestive heart failure) ([facility]) 01/08/2017  . Microcytic anemia   . Morbid obesity ([facility])   . Sickle cell trait [facility])     Past Surgical History:  Procedure Laterality Date  . CESAREAN SECTION      Social History   Tobacco Use  . Smoking status: Never Smoker  . Smokeless tobacco: Never Used  Substance Use Topics  . Alcohol use: Yes    Comment: socially, 1x month    Family History  Problem Relation Age of Onset  . Diabetes Mother   .  Heart disease Mother   . Hypertension Mother   . Stroke Mother   . Sjogren's syndrome Mother     Review of Systems  Constitutional: Negative for chills and fever.  Respiratory: Negative for cough and shortness of breath.   Cardiovascular: Negative for chest pain, palpitations and leg swelling.  Gastrointestinal: Negative for abdominal pain, nausea and vomiting.     OBJECTIVE:  Blood pressure (!) 149/86, pulse 79, temperature 98 F (36.7 C), temperature source Oral, resp. rate 18, height 5\' 3"  (1.6 m), weight 281 lb 3.2 oz (127.6 kg), SpO2 98 %. Body mass index is 49.81 kg/m.   BP Readings from Last 3 Encounters:  06/20/18 (!) 149/86  05/06/18 (!) 160/88  09/27/17 136/74   Wt Readings from Last 3 Encounters:  06/20/18 281 lb 3.2 oz (127.6 kg)  05/06/18 278 lb 12.8 oz (126.5 kg)  09/27/17 252 lb 9.6 oz (114.6 kg)    Physical Exam  Constitutional: She is oriented to person, place, and time. She appears well-developed and well-nourished.  HENT:  Head: Normocephalic and atraumatic.  Mouth/Throat: Oropharynx is clear and moist. No oropharyngeal exudate.  Eyes: Pupils are equal, round, and reactive to light. Conjunctivae and EOM are normal. No scleral icterus.  Neck: Neck supple.  Cardiovascular: Normal rate, regular rhythm and normal heart sounds. Exam reveals no gallop and no friction rub.  No murmur heard. Pulmonary/Chest: Effort normal and breath sounds normal. She has no wheezes. She has no rales.  Musculoskeletal: She exhibits no edema.  Neurological: She is alert and oriented to person, place, and time.  Skin: Skin is warm and dry.  Psychiatric: She has a normal mood and affect.  Nursing note and vitals reviewed.    Diabetic Foot Exam - Simple   Simple Foot Form Diabetic Foot exam was performed with the following findings:  Yes 06/20/2018 11:48 AM  Visual Inspection No deformities, no ulcerations, no other skin breakdown bilaterally:  Yes Sensation  Testing Intact to touch and monofilament testing bilaterally:  Yes Pulse Check Posterior Tibialis and Dorsalis pulse intact bilaterally:  Yes Comments     ASSESSMENT and PLAN  1. Type 2 diabetes mellitus with hyperglycemia, with long-term current use of insulin (HCC) Controlled. However has had weight gain since starting insulin. Discussed treatment options, reviewed formulary. Given normal a1c, will do trial of januvia, consider adding metformin 500mg  discussed new med r/se/b.  Stop 70/30. Continue checking cbgs daily. Cont LFM - Comprehensive metabolic panel - Lipid panel - Hemoglobin A1c - TSH - HM Diabetes Foot Exam  2. Essential hypertension Above goal. On 5 agents. Discussed moving amlodpine to lunch time to see if able to get a more constant control - Comprehensive metabolic panel - Lipid panel - TSH - Care order/instruction:  3.  CKD (chronic kidney disease), stage II Checking labs today.  - Comprehensive metabolic panel - CBC  Other orders - sitaGLIPtin (JANUVIA) 100 MG tablet; Take 1 tablet (100 mg total) by mouth daily.  Patient will schedule appt for pap and eye exam  Return in about 3 months (around 09/20/2018).    Myles Lipps, MD Primary Care at [facility] [address] [place], [place] [address] Ph.  [phone] Fax [phone]

## 2018-06-21 LAB — LIPID PANEL
Chol/HDL Ratio: 3.5 ratio (ref 0.0–4.4)
Cholesterol, Total: 146 mg/dL (ref 100–199)
HDL: 42 mg/dL (ref >39)
LDL Calculated: 92 mg/dL (ref 0–99)
Triglycerides: 62 mg/dL (ref 0–149)
VLDL Cholesterol Cal: 12 mg/dL (ref 5–40)

## 2018-06-21 LAB — COMPREHENSIVE METABOLIC PANEL
ALT: 10 IU/L (ref 0–32)
AST: 7 IU/L (ref 0–40)
Albumin/Globulin Ratio: 1.4 (ref 1.2–2.2)
Albumin: 4.4 g/dL (ref 3.5–5.5)
Alkaline Phosphatase: 76 IU/L (ref 39–117)
BUN/Creatinine Ratio: 18 (ref 9–23)
BUN: 25 mg/dL — ABNORMAL HIGH (ref 6–24)
Bilirubin Total: 0.3 mg/dL (ref 0.0–1.2)
CO2: 17 mmol/L — ABNORMAL LOW (ref 20–29)
Calcium: 9.3 mg/dL (ref 8.7–10.2)
Chloride: 104 mmol/L (ref 96–106)
Creatinine, Ser: 1.41 mg/dL — ABNORMAL HIGH (ref 0.57–1.00)
GFR calc Af Amer: 53 mL/min/{1.73_m2} — ABNORMAL LOW (ref >59)
GFR calc non Af Amer: 46 mL/min/{1.73_m2} — ABNORMAL LOW (ref >59)
Globulin, Total: 3.2 g/dL (ref 1.5–4.5)
Glucose: 92 mg/dL (ref 65–99)
Potassium: 4.9 mmol/L (ref 3.5–5.2)
Sodium: 138 mmol/L (ref 134–144)
Total Protein: 7.6 g/dL (ref 6.0–8.5)

## 2018-06-21 LAB — CBC
Hematocrit: 28.4 % — ABNORMAL LOW (ref 34.0–46.6)
Hemoglobin: 8.8 g/dL — ABNORMAL LOW (ref 11.1–15.9)
MCH: 19.8 pg — ABNORMAL LOW (ref 26.6–33.0)
MCHC: 31 g/dL — ABNORMAL LOW (ref 31.5–35.7)
MCV: 64 fL — ABNORMAL LOW (ref 79–97)
Platelets: 201 10*3/uL (ref 150–450)
RBC: 4.45 x10E6/uL (ref 3.77–5.28)
RDW: 18.8 % — ABNORMAL HIGH (ref 12.3–15.4)
WBC: 12.4 10*3/uL — ABNORMAL HIGH (ref 3.4–10.8)

## 2018-06-21 LAB — HEMOGLOBIN A1C
Est. average glucose Bld gHb Est-mCnc: 120 mg/dL
Hgb A1c MFr Bld: 5.8 % — ABNORMAL HIGH (ref 4.8–5.6)

## 2018-06-21 LAB — TSH: TSH: 4.65 u[IU]/mL — ABNORMAL HIGH (ref 0.450–4.500)

## 2018-06-22 MED ORDER — FERROUS GLUCONATE 324 (38 FE) MG PO TABS: 324.0000 mg | ORAL_TABLET | Freq: Two times a day (BID) | ORAL | 0 refills | Status: DC

## 2018-06-22 NOTE — Addendum Note (Signed)
Addended by: Myles Lipps on: 06/22/2018 10:51 AM   Modules accepted: Orders

## 2018-06-22 NOTE — Addendum Note (Signed)
Addended by: Myles Lipps on: 06/22/2018 10:45 AM   Modules accepted: Orders

## 2018-06-27 ENCOUNTER — Other Ambulatory Visit: Payer: Self-pay | Admitting: Cardiology

## 2018-06-27 DIAGNOSIS — I5031 Acute diastolic (congestive) heart failure: Secondary | ICD-10-CM

## 2018-06-27 DIAGNOSIS — I10 Essential (primary) hypertension: Secondary | ICD-10-CM

## 2018-06-30 ENCOUNTER — Other Ambulatory Visit: Payer: Self-pay | Admitting: Family Medicine

## 2018-06-30 DIAGNOSIS — I10 Essential (primary) hypertension: Secondary | ICD-10-CM

## 2018-06-30 DIAGNOSIS — I5031 Acute diastolic (congestive) heart failure: Secondary | ICD-10-CM

## 2018-06-30 NOTE — Telephone Encounter (Signed)
Copied from CRM [id number]. Topic: Quick Communication - Rx Refill/Question >> Jun 30, 2018  1:04 PM Jaquita Rector A wrote: Medication: losartan (COZAAR) 100 MG tablet   Patient was seen on 06/20/18 by Dr Leretha Pol. Said she is all out and need this today, say pharmacy had already given an emergency supply for the weekend  Has the patient contacted their pharmacy? Yes   Preferred Pharmacy (with phone number or street name): [id number] - [place],  - [address] AT [facility] [address] [phone] (Phone) [phone] (Fax)    Agent: Please be advised that RX refills may take up to 3 business days. We ask that you follow-up with your pharmacy.

## 2018-07-01 MED ORDER — LOSARTAN POTASSIUM 100 MG PO TABS: 100.0000 mg | ORAL_TABLET | Freq: Every day | ORAL | 0 refills | Status: DC

## 2018-07-01 NOTE — Telephone Encounter (Signed)
Requested Prescriptions  Pending Prescriptions Disp Refills  . losartan (COZAAR) 100 MG tablet 90 tablet 0    Sig: Take 1 tablet (100 mg total) by mouth daily.     Cardiovascular:  Angiotensin Receptor Blockers Failed - 06/30/2018  3:17 PM      Failed - Cr in normal range and within 180 days    Creat  Date Value Ref Range Status  03/09/2016 0.96 0.50 - 1.10 mg/dL Final   Creatinine, Ser  Date Value Ref Range Status  06/20/2018 1.41 (H) 0.57 - 1.00 mg/dL Final         Failed - Last BP in normal range    BP Readings from Last 1 Encounters:  06/20/18 (!) 149/86         Passed - K in normal range and within 180 days    Potassium  Date Value Ref Range Status  06/20/2018 4.9 3.5 - 5.2 mmol/L Final    Comment:    Specimen received in contact with cells. No visible hemolysis present. However GLUC may be decreased and K increased. Clinical correlation indicated.          Passed - Patient is not pregnant      Passed - Valid encounter within last 6 months    Recent Outpatient Visits          1 week ago Type 2 diabetes mellitus with hyperglycemia, with long-term current use of insulin [facility])   [facility] at [facility], Meda Coffee, MD   9 months ago Type 2 diabetes mellitus with hyperglycemia, with long-term current use of insulin [facility])   [facility] at [facility], Dema Severin, PA-C   1 year ago Chronic diastolic CHF (congestive heart failure), NYHA class 2 [facility])   [facility] at [facility], Dema Severin, PA-C   1 year ago Essential hypertension   [facility] at [facility], Dema Severin, PA-C   2 years ago Uncontrolled hypertension   Primary Care at [facility], [place], [place]

## 2018-07-16 ENCOUNTER — Other Ambulatory Visit: Payer: Self-pay | Admitting: *Deleted

## 2018-07-16 ENCOUNTER — Other Ambulatory Visit: Payer: Self-pay | Admitting: Family Medicine

## 2018-07-16 DIAGNOSIS — I10 Essential (primary) hypertension: Secondary | ICD-10-CM

## 2018-07-16 MED ORDER — AMLODIPINE BESYLATE 10 MG PO TABS: ORAL_TABLET | ORAL | 1 refills | Status: DC

## 2018-07-18 ENCOUNTER — Other Ambulatory Visit: Payer: BLUE CROSS/BLUE SHIELD

## 2018-07-25 ENCOUNTER — Other Ambulatory Visit: Payer: BLUE CROSS/BLUE SHIELD | Admitting: *Deleted

## 2018-07-25 DIAGNOSIS — N182 Chronic kidney disease, stage 2 (mild): Secondary | ICD-10-CM

## 2018-07-25 DIAGNOSIS — I10 Essential (primary) hypertension: Secondary | ICD-10-CM

## 2018-07-25 LAB — BASIC METABOLIC PANEL
BUN/Creatinine Ratio: 18 (ref 9–23)
BUN: 26 mg/dL — AB (ref 6–24)
CALCIUM: 9 mg/dL (ref 8.7–10.2)
CHLORIDE: 106 mmol/L (ref 96–106)
CO2: 21 mmol/L (ref 20–29)
Creatinine, Ser: 1.46 mg/dL — ABNORMAL HIGH (ref 0.57–1.00)
GFR calc Af Amer: 50 mL/min/{1.73_m2} — ABNORMAL LOW (ref >59)
GFR calc non Af Amer: 44 mL/min/{1.73_m2} — ABNORMAL LOW (ref >59)
GLUCOSE: 120 mg/dL — AB (ref 65–99)
POTASSIUM: 4.2 mmol/L (ref 3.5–5.2)
Sodium: 141 mmol/L (ref 134–144)

## 2018-07-28 ENCOUNTER — Telehealth: Payer: Self-pay

## 2018-07-28 ENCOUNTER — Telehealth: Payer: Self-pay | Admitting: Cardiology

## 2018-07-28 NOTE — Telephone Encounter (Signed)
New Message           Patient returned your call and would like a call back. Patient states it's ok  To leave a vmsg on the phone, it's her personal line.

## 2018-07-28 NOTE — Telephone Encounter (Signed)
Informed patient of results and made her an appointment with Dr. Delton SeeNelson in February.     ----- Message from Dyann KiefMichele M Lenze, PA-C sent at 07/28/2018  7:45 AM EST ----- Kidney function up a little but similar to the past year. No changes. Make appt for Dr. Delton SeeNelson in the next couple of months

## 2018-07-28 NOTE — Telephone Encounter (Signed)
-----   Message from Dyann KiefMichele M Lenze, PA-C sent at 07/28/2018  7:45 AM EST ----- Kidney function up a little but similar to the past year. No changes. Make appt for Dr. Delton SeeNelson in the next couple of months

## 2018-07-28 NOTE — Telephone Encounter (Signed)
Left message for patient to call back  

## 2018-07-29 NOTE — Telephone Encounter (Signed)
Ethelda ChickPate Ingalls, Pamela, RN 15 hours ago (4:47 PM)     Informed patient of results and made her an appointment with Dr. Delton SeeNelson in February.

## 2018-09-21 ENCOUNTER — Other Ambulatory Visit: Payer: Self-pay | Admitting: Cardiology

## 2018-09-21 DIAGNOSIS — I10 Essential (primary) hypertension: Secondary | ICD-10-CM

## 2018-09-25 ENCOUNTER — Other Ambulatory Visit: Payer: Self-pay | Admitting: Family Medicine

## 2018-09-25 DIAGNOSIS — I10 Essential (primary) hypertension: Secondary | ICD-10-CM

## 2018-09-25 DIAGNOSIS — I5031 Acute diastolic (congestive) heart failure: Secondary | ICD-10-CM

## 2018-09-25 NOTE — Telephone Encounter (Signed)
Requested Prescriptions  Pending Prescriptions Disp Refills  . losartan (COZAAR) 100 MG tablet [Pharmacy Med Name: LOSARTAN 100MG  TABLETS] 90 tablet 0    Sig: TAKE 1 TABLET BY MOUTH DAILY     Cardiovascular:  Angiotensin Receptor Blockers Failed - 09/25/2018  1:13 PM      Failed - Cr in normal range and within 180 days    Creat  Date Value Ref Range Status  03/09/2016 0.96 0.50 - 1.10 mg/dL Final   Creatinine, Ser  Date Value Ref Range Status  07/25/2018 1.46 (H) 0.57 - 1.00 mg/dL Final         Failed - Last BP in normal range    BP Readings from Last 1 Encounters:  06/20/18 (!) 149/86         Passed - K in normal range and within 180 days    Potassium  Date Value Ref Range Status  07/25/2018 4.2 3.5 - 5.2 mmol/L Final         Passed - Patient is not pregnant      Passed - Valid encounter within last 6 months    Recent Outpatient Visits          3 months ago Type 2 diabetes mellitus with hyperglycemia, with long-term current use of insulin (HCC)   [facility] at [facility], Meda Coffee, MD   12 months ago Type 2 diabetes mellitus with hyperglycemia, with long-term current use of insulin [facility])   [facility] at [facility], Dema Severin, PA-C   1 year ago Chronic diastolic CHF (congestive heart failure), NYHA class 2 [facility])   [facility] at [facility], Dema Severin, PA-C   1 year ago Essential hypertension   [facility] at [facility], Dema Severin, PA-C   2 years ago Uncontrolled hypertension   Primary Care at [facility], [place], [place]

## 2018-10-15 ENCOUNTER — Ambulatory Visit: Payer: BLUE CROSS/BLUE SHIELD | Admitting: Cardiology

## 2018-11-27 ENCOUNTER — Telehealth: Payer: Self-pay | Admitting: Cardiology

## 2018-11-27 DIAGNOSIS — I110 Hypertensive heart disease with heart failure: Secondary | ICD-10-CM

## 2018-11-27 DIAGNOSIS — I5032 Chronic diastolic (congestive) heart failure: Secondary | ICD-10-CM

## 2018-11-27 DIAGNOSIS — I10 Essential (primary) hypertension: Secondary | ICD-10-CM

## 2018-11-27 DIAGNOSIS — N182 Chronic kidney disease, stage 2 (mild): Secondary | ICD-10-CM

## 2018-11-27 MED ORDER — FUROSEMIDE 20 MG PO TABS: 20.0000 mg | ORAL_TABLET | Freq: Every day | ORAL | 2 refills | Status: DC

## 2018-11-27 MED ORDER — CARVEDILOL 25 MG PO TABS: ORAL_TABLET | ORAL | 2 refills | Status: DC

## 2018-11-27 MED ORDER — HYDRALAZINE HCL 25 MG PO TABS: 25.0000 mg | ORAL_TABLET | Freq: Three times a day (TID) | ORAL | 2 refills | Status: DC

## 2018-11-27 MED ORDER — SPIRONOLACTONE 50 MG PO TABS: 50.0000 mg | ORAL_TABLET | Freq: Every day | ORAL | 1 refills | Status: DC

## 2018-11-27 MED ORDER — AMLODIPINE BESYLATE 10 MG PO TABS: ORAL_TABLET | ORAL | 2 refills | Status: DC

## 2018-11-27 NOTE — Telephone Encounter (Signed)
   Primary Cardiologist:  Tobias Alexander, MD   Patient contacted.  History reviewed.  No symptoms to suggest any unstable cardiac conditions.  Based on discussion, with current pandemic situation, we will be postponing this appointment for 12/05/18 WITH DR NELSON FOR 6-12 WEEKS OUT.   If symptoms change, she has been instructed to contact our office.   Routing to C19 CANCEL pool for tracking (P CV DIV CV19 CANCEL) and assigning priority (1 = 4-6 wks, 2 = 6-12 wks, 3 = >12 wks).  Loa Socks, LPN  6/38/9373 4:28 PM          Cardiac Questionnaire:    Since your last visit or hospitalization:    1. Have you been having new or worsening chest pain? NO   2. Have you been having new or worsening shortness of breath? NO 3. Have you been having new or worsening leg swelling, wt gain, or increase in abdominal girth (pants fitting more tightly)? NO   4. Have you had any passing out spells? NO  SCHEDULED THE PTS LAB APPT FOR BMET AND CBC W DIFF FOR 12/15/18.  ALSO REFILLED THE PTS CARDIAC MEDS TO HER CONFIRMED PHARMACY OF CHOICE.  PT WAS MORE THAN GRACIOUS FOR ALL THE ASSISTANCE PROVIDED.

## 2018-11-27 NOTE — Telephone Encounter (Signed)
The patient is currently scheduled to be seen on December 05, 2018, her chart was reviewed and if she has well-controlled blood pressure and no new symptoms she can be postponed for 6 to 12 weeks.  The however I would like her to come to our office to check her labs including BMP and CBC.  Tobias Alexander, MD 11/27/2018

## 2018-11-27 NOTE — Addendum Note (Signed)
Addended by: Loa Socks on: 11/27/2018 06:04 PM   Modules accepted: Orders

## 2018-12-05 ENCOUNTER — Ambulatory Visit: Payer: BLUE CROSS/BLUE SHIELD | Admitting: Cardiology

## 2018-12-15 ENCOUNTER — Other Ambulatory Visit: Payer: BLUE CROSS/BLUE SHIELD

## 2018-12-15 ENCOUNTER — Other Ambulatory Visit: Payer: Self-pay

## 2018-12-15 DIAGNOSIS — I110 Hypertensive heart disease with heart failure: Secondary | ICD-10-CM

## 2018-12-15 DIAGNOSIS — N182 Chronic kidney disease, stage 2 (mild): Secondary | ICD-10-CM

## 2018-12-15 DIAGNOSIS — I5032 Chronic diastolic (congestive) heart failure: Secondary | ICD-10-CM

## 2018-12-15 DIAGNOSIS — I10 Essential (primary) hypertension: Secondary | ICD-10-CM

## 2018-12-15 LAB — CBC WITH DIFFERENTIAL/PLATELET
Basophils Absolute: 0 10*3/uL (ref 0.0–0.2)
Basos: 0 %
EOS (ABSOLUTE): 0.3 10*3/uL (ref 0.0–0.4)
Eos: 2 %
Hematocrit: 28 % — ABNORMAL LOW (ref 34.0–46.6)
Hemoglobin: 8.3 g/dL — ABNORMAL LOW (ref 11.1–15.9)
Immature Grans (Abs): 0.1 10*3/uL (ref 0.0–0.1)
Immature Granulocytes: 1 %
Lymphocytes Absolute: 3 10*3/uL (ref 0.7–3.1)
Lymphs: 26 %
MCH: 19 pg — ABNORMAL LOW (ref 26.6–33.0)
MCHC: 29.6 g/dL — ABNORMAL LOW (ref 31.5–35.7)
MCV: 64 fL — ABNORMAL LOW (ref 79–97)
Monocytes Absolute: 0.9 10*3/uL (ref 0.1–0.9)
Monocytes: 7 %
Neutrophils Absolute: 7.4 10*3/uL — ABNORMAL HIGH (ref 1.4–7.0)
Neutrophils: 64 %
Platelets: 207 10*3/uL (ref 150–450)
RBC: 4.37 x10E6/uL (ref 3.77–5.28)
RDW: 20.3 % — ABNORMAL HIGH (ref 11.7–15.4)
WBC: 11.7 10*3/uL — ABNORMAL HIGH (ref 3.4–10.8)

## 2018-12-15 LAB — BASIC METABOLIC PANEL
BUN/Creatinine Ratio: 16 (ref 9–23)
BUN: 24 mg/dL (ref 6–24)
CO2: 19 mmol/L — ABNORMAL LOW (ref 20–29)
Calcium: 9 mg/dL (ref 8.7–10.2)
Chloride: 104 mmol/L (ref 96–106)
Creatinine, Ser: 1.5 mg/dL — ABNORMAL HIGH (ref 0.57–1.00)
GFR calc Af Amer: 49 mL/min/{1.73_m2} — ABNORMAL LOW (ref >59)
GFR calc non Af Amer: 42 mL/min/{1.73_m2} — ABNORMAL LOW (ref >59)
Glucose: 171 mg/dL — ABNORMAL HIGH (ref 65–99)
Potassium: 4.7 mmol/L (ref 3.5–5.2)
Sodium: 138 mmol/L (ref 134–144)

## 2018-12-16 ENCOUNTER — Encounter: Payer: Self-pay | Admitting: *Deleted

## 2018-12-16 ENCOUNTER — Other Ambulatory Visit: Payer: Self-pay | Admitting: Cardiology

## 2018-12-16 DIAGNOSIS — I110 Hypertensive heart disease with heart failure: Secondary | ICD-10-CM

## 2018-12-16 DIAGNOSIS — N182 Chronic kidney disease, stage 2 (mild): Secondary | ICD-10-CM

## 2018-12-16 DIAGNOSIS — I5032 Chronic diastolic (congestive) heart failure: Secondary | ICD-10-CM

## 2018-12-16 DIAGNOSIS — I10 Essential (primary) hypertension: Secondary | ICD-10-CM

## 2018-12-16 NOTE — Telephone Encounter (Signed)
See mychart encounter with consent to treat the pt tomorrow on 4/8 with Dr Delton See, via virtual video visit through [employer]xim[employer].  Pt was advised of instructions on how to prepare for this visit. Consent form sent in the mychart message and pt has read and agreed for treatment.  Pt verbalized understanding and agrees with this plan.

## 2018-12-17 ENCOUNTER — Other Ambulatory Visit: Payer: Self-pay

## 2018-12-17 ENCOUNTER — Telehealth (INDEPENDENT_AMBULATORY_CARE_PROVIDER_SITE_OTHER): Payer: BLUE CROSS/BLUE SHIELD | Admitting: Cardiology

## 2018-12-17 ENCOUNTER — Encounter: Payer: Self-pay | Admitting: *Deleted

## 2018-12-17 ENCOUNTER — Encounter: Payer: Self-pay | Admitting: Cardiology

## 2018-12-17 DIAGNOSIS — I5032 Chronic diastolic (congestive) heart failure: Secondary | ICD-10-CM | POA: Diagnosis not present

## 2018-12-17 DIAGNOSIS — N182 Chronic kidney disease, stage 2 (mild): Secondary | ICD-10-CM | POA: Diagnosis not present

## 2018-12-17 DIAGNOSIS — I110 Hypertensive heart disease with heart failure: Secondary | ICD-10-CM | POA: Diagnosis not present

## 2018-12-17 DIAGNOSIS — I10 Essential (primary) hypertension: Secondary | ICD-10-CM

## 2018-12-17 DIAGNOSIS — I5031 Acute diastolic (congestive) heart failure: Secondary | ICD-10-CM

## 2018-12-17 MED ORDER — LOSARTAN POTASSIUM 100 MG PO TABS: 100.0000 mg | ORAL_TABLET | Freq: Every day | ORAL | 4 refills | Status: DC

## 2018-12-17 MED ORDER — CARVEDILOL 25 MG PO TABS: ORAL_TABLET | ORAL | 4 refills | Status: DC

## 2018-12-17 MED ORDER — SPIRONOLACTONE 50 MG PO TABS: 50.0000 mg | ORAL_TABLET | Freq: Every day | ORAL | 4 refills | Status: DC

## 2018-12-17 MED ORDER — LANSOPRAZOLE 15 MG PO CPDR: 15.0000 mg | DELAYED_RELEASE_CAPSULE | Freq: Every day | ORAL | 0 refills | Status: AC

## 2018-12-17 MED ORDER — AMLODIPINE BESYLATE 10 MG PO TABS: ORAL_TABLET | ORAL | 4 refills | Status: DC

## 2018-12-17 MED ORDER — FUROSEMIDE 20 MG PO TABS: ORAL_TABLET | ORAL | 4 refills | Status: DC

## 2018-12-17 MED ORDER — HYDRALAZINE HCL 25 MG PO TABS: 25.0000 mg | ORAL_TABLET | Freq: Three times a day (TID) | ORAL | 4 refills | Status: DC

## 2018-12-17 NOTE — Progress Notes (Signed)
Virtual Visit via Video Note   This visit type was conducted due to national recommendations for restrictions regarding the COVID-19 Pandemic (e.g. social distancing) in an effort to limit this patient's exposure and mitigate transmission in our community.  Due to her co-morbid illnesses, this patient is at least at moderate risk for complications without adequate follow up.  This format is felt to be most appropriate for this patient at this time.  All issues noted in this document were discussed and addressed.  A limited physical exam was performed with this format.  Please refer to the patient's chart for her consent to telehealth for [facility].   Evaluation Performed:  Follow-up visit  Date:  12/17/2018   ID:  Brittany Lopez, DOB 1976/08/04, MRN [id number]  Patient Location: Home  Provider Location: Home  PCP:  Myles Lipps, MD  Cardiologist:  Tobias Alexander, MD  Electrophysiologist:  None   Chief Complaint:  Epigastric pain  History of Present Illness:    Brittany Lopez is a 43 y.o. female who presents via audio/video conferencing for a telehealth visit today.    History of Present Illness:  Brittany Lopez is a 43 y.o. female with history of hypertension, DM, morbid obesity, chronic diastolic CHF, CKD, sickle cell trait.  She has had difficult to control hypertension. 2D echo 2015 LVEF 50 to 55% with mild LVH, grade 1 DD.  Patient is being seen for 6 months follow up. Recent URI - sinusitis like, no fever, cough. She has been experiencing epigastric pains radiating to her back if without food for prolonged time. No chest pain, SOB, DOE, LE edema. She has been complaint with her meds, no side effects, no dizziness. BP lately in 110-120 mmHg range.  The patient does not have symptoms concerning for COVID-19 infection (fever, chills, cough, or new shortness of breath).    Past Medical History:  Diagnosis Date  . Chronic diastolic CHF (congestive heart  failure) (HCC)   . CKD (chronic kidney disease), stage II   . Diabetes mellitus without complication (HCC)   . Hyperlipidemia   . Hypertension   . Hypertensive heart disease with CHF (congestive heart failure) (HCC) 01/08/2017  . Microcytic anemia   . Morbid obesity (HCC)   . Sickle cell trait [facility])    Past Surgical History:  Procedure Laterality Date  . CESAREAN SECTION       Current Meds  Medication Sig  . amLODipine (NORVASC) 10 MG tablet TAKE 1 TABLET(10 MG) BY MOUTH DAILY  . carvedilol (COREG) 25 MG tablet TAKE 1 TABLET(25 MG) BY MOUTH TWICE DAILY WITH A MEAL  . folic acid (FOLVITE) 1 MG tablet Take 1 tablet (1 mg total) by mouth daily.  . furosemide (LASIX) 20 MG tablet TAKE 1 TABLET BY MOUTH DAILY.(OVERDUE FOR YEARLY APPOINTMENT. NEED TO SCHEDULE WITH DOCTOR Shelden Raborn FOR MORE REFILLS)  . hydrALAZINE (APRESOLINE) 25 MG tablet Take 1 tablet (25 mg total) by mouth 3 (three) times daily.  Marland Kitchen losartan (COZAAR) 100 MG tablet TAKE 1 TABLET BY MOUTH DAILY  . spironolactone (ALDACTONE) 50 MG tablet Take 1 tablet (50 mg total) by mouth daily.     Allergies:   Patient has no known allergies.   Social History   Tobacco Use  . Smoking status: Never Smoker  . Smokeless tobacco: Never Used  Substance Use Topics  . Alcohol use: Yes    Comment: socially, 1x month  . Drug use: No     Family Hx:  The patient's family history includes Diabetes in her mother; Heart disease in her mother; Hypertension in her mother; Sjogren's syndrome in her mother; Stroke in her mother.  ROS:   Please see the history of present illness.    All other systems reviewed and are negative.   Prior CV studies:   The following studies were reviewed today:   Labs/Other Tests and Data Reviewed:    EKG:  No ECG reviewed.  Recent Labs: 06/20/2018: ALT 10; TSH 4.650 12/15/2018: BUN 24; Creatinine, Ser 1.50; Hemoglobin 8.3; Platelets 207; Potassium 4.7; Sodium 138   Recent Lipid Panel Lab Results  Component  Value Date/Time   CHOL 146 06/20/2018 12:05 PM   TRIG 62 06/20/2018 12:05 PM   HDL 42 06/20/2018 12:05 PM   CHOLHDL 3.5 06/20/2018 12:05 PM   CHOLHDL 3.8 05/24/2010 04:40 AM   LDLCALC 92 06/20/2018 12:05 PM    Wt Readings from Last 3 Encounters:  12/17/18 294 lb (133.4 kg)  06/20/18 281 lb 3.2 oz (127.6 kg)  05/06/18 278 lb 12.8 oz (126.5 kg)     Objective:    Vital Signs:  BP 113/73   Pulse 77   Ht 5\' 2"  (1.575 m)   Wt 294 lb (133.4 kg)   BMI 53.77 kg/m    Well nourished, well developed female in no acute distress.   ASSESSMENT & PLAN:    1. Hypertension - well controlled, Crea up 1.4-1.5, she is advised to increase fluid intake, continue the same medication regimen, we will provide refills. 2. Chronic diastolic CHF - she is euvolemic 3. CKD stage 3 - Crea up, GFR 42, advised to increase free water intake 4. Epigastric pain - advised a trial of OTC prevacid 15 mg po daily for 1 week, if it helps continue. Also advised to eat small portion of low calorie foods like fruits in between meals.  COVID-19 Education: The signs and symptoms of COVID-19 were discussed with the patient and how to seek care for testing (follow up with PCP or arrange E-visit).  The importance of social distancing was discussed today.  Time:   Today, I have spent 30 minutes with the patient with telehealth technology discussing the above problems.     Medication Adjustments/Labs and Tests Ordered: Current medicines are reviewed at length with the patient today.  Concerns regarding medicines are outlined above.  Tests Ordered: No orders of the defined types were placed in this encounter.  Medication Changes: No orders of the defined types were placed in this encounter.   Disposition:  Follow up in 6 month(s), CMP, CBC prior to that appointment.  Signed, Tobias AlexanderKatarina Karolynn Infantino, MD  12/17/2018 9:39 AM    [facility]

## 2018-12-17 NOTE — Patient Instructions (Signed)
Medication Instructions:    START TAKING LANSOPRAZOLE (PREVACID) 15 MG BY MOUTH DAILY AT NOON FOR ONE WEEK ONLY.    If you need a refill on your cardiac medications before your next appointment, please call your pharmacy.     Lab work:  PRIOR TO YOUR NEXT VISIT WITH DR NELSON IN 6 MONTHS TO CHECK--CMET, TSH, AND CBC W TO HAVE LABS AND DIFF. -PLEASE CALL IN July 2020 TO ARRANGE THIS LAB APPOINTMENT PRIOR TO YOUR 6 MONTH FOLLOW-UP APPOINTMENT WITH DR NELSON-YOU WILL BE DUE OFFICE VISIT IN SEPTEMBER  If you have labs (blood work) drawn today and your tests are completely normal, you will receive your results only by: Marland Kitchen MyChart Message (if you have MyChart) OR . A paper copy in the mail If you have any lab test that is abnormal or we need to change your treatment, we will call you to review the results.     Follow-Up: At [facility], you and your health needs are our priority.  As part of our continuing mission to provide you with exceptional heart care, we have created designated Provider Care Teams.  These Care Teams include your primary Cardiologist (physician) and Advanced Practice Providers (APPs -  Physician Assistants and Nurse Practitioners) who all work together to provide you with the care you need, when you need it.   Your physician wants you to follow-up in: 6 MONTHS WITH DR Johnell Comings will receive a reminder letter in the mail two months in advance. If you don't receive a letter, please call our office to schedule the follow-up appointment.  PLEASE CALL THE OFFICE IN LATE July 2020 TO ARRANGE YOUR LAB APPOINTMENT A FEW DAYS PRIOR TO YOUR VISIT WITH DR NELSON--YOU WILL BE DUE TO SEE DR Delton See IN September 2020

## 2018-12-18 ENCOUNTER — Telehealth: Payer: BLUE CROSS/BLUE SHIELD | Admitting: Cardiology

## 2018-12-18 NOTE — Telephone Encounter (Signed)
Pt was seen via virtual visit with Dr. Delton See on 4/8. Will remove from pool.

## 2019-01-10 ENCOUNTER — Other Ambulatory Visit: Payer: Self-pay | Admitting: Family Medicine

## 2019-01-10 DIAGNOSIS — I5032 Chronic diastolic (congestive) heart failure: Secondary | ICD-10-CM

## 2019-01-10 DIAGNOSIS — I10 Essential (primary) hypertension: Secondary | ICD-10-CM

## 2019-01-10 DIAGNOSIS — N182 Chronic kidney disease, stage 2 (mild): Secondary | ICD-10-CM

## 2019-01-10 DIAGNOSIS — I110 Hypertensive heart disease with heart failure: Secondary | ICD-10-CM

## 2019-01-10 DIAGNOSIS — I5031 Acute diastolic (congestive) heart failure: Secondary | ICD-10-CM

## 2019-01-12 ENCOUNTER — Telehealth: Payer: Self-pay | Admitting: Family Medicine

## 2019-01-12 ENCOUNTER — Other Ambulatory Visit: Payer: Self-pay

## 2019-01-12 DIAGNOSIS — Z794 Long term (current) use of insulin: Principal | ICD-10-CM

## 2019-01-12 DIAGNOSIS — E1165 Type 2 diabetes mellitus with hyperglycemia: Secondary | ICD-10-CM

## 2019-01-12 MED ORDER — FOLIC ACID 1 MG PO TABS: 1.0000 mg | ORAL_TABLET | Freq: Every day | ORAL | 4 refills | Status: DC

## 2019-01-12 NOTE — Telephone Encounter (Signed)
Spoke with pt and Rx was sent on 12/17/2018

## 2019-01-12 NOTE — Telephone Encounter (Signed)
Patient needs some medication refills before her appnt on the 15th of May  She would like a phone call back

## 2019-01-23 ENCOUNTER — Other Ambulatory Visit: Payer: Self-pay

## 2019-01-23 ENCOUNTER — Telehealth: Payer: BLUE CROSS/BLUE SHIELD | Admitting: Family Medicine

## 2019-05-20 ENCOUNTER — Other Ambulatory Visit: Payer: Self-pay

## 2019-05-20 DIAGNOSIS — I5032 Chronic diastolic (congestive) heart failure: Secondary | ICD-10-CM

## 2019-05-20 DIAGNOSIS — I110 Hypertensive heart disease with heart failure: Secondary | ICD-10-CM

## 2019-05-20 DIAGNOSIS — N182 Chronic kidney disease, stage 2 (mild): Secondary | ICD-10-CM

## 2019-05-20 DIAGNOSIS — I10 Essential (primary) hypertension: Secondary | ICD-10-CM

## 2019-05-20 DIAGNOSIS — I5031 Acute diastolic (congestive) heart failure: Secondary | ICD-10-CM

## 2019-05-20 MED ORDER — SPIRONOLACTONE 50 MG PO TABS: 50.0000 mg | ORAL_TABLET | Freq: Every day | ORAL | 1 refills | Status: DC

## 2019-09-18 ENCOUNTER — Other Ambulatory Visit: Payer: Self-pay | Admitting: Cardiology

## 2019-09-18 DIAGNOSIS — I10 Essential (primary) hypertension: Secondary | ICD-10-CM

## 2019-09-18 DIAGNOSIS — N182 Chronic kidney disease, stage 2 (mild): Secondary | ICD-10-CM

## 2019-09-18 DIAGNOSIS — I5031 Acute diastolic (congestive) heart failure: Secondary | ICD-10-CM

## 2019-09-18 DIAGNOSIS — I110 Hypertensive heart disease with heart failure: Secondary | ICD-10-CM

## 2019-09-18 DIAGNOSIS — I5032 Chronic diastolic (congestive) heart failure: Secondary | ICD-10-CM

## 2019-11-16 ENCOUNTER — Other Ambulatory Visit: Payer: Self-pay | Admitting: Cardiology

## 2019-11-16 DIAGNOSIS — I5031 Acute diastolic (congestive) heart failure: Secondary | ICD-10-CM

## 2019-11-16 DIAGNOSIS — I10 Essential (primary) hypertension: Secondary | ICD-10-CM

## 2019-11-16 DIAGNOSIS — N182 Chronic kidney disease, stage 2 (mild): Secondary | ICD-10-CM

## 2019-11-16 DIAGNOSIS — I110 Hypertensive heart disease with heart failure: Secondary | ICD-10-CM

## 2019-11-16 DIAGNOSIS — I5032 Chronic diastolic (congestive) heart failure: Secondary | ICD-10-CM

## 2019-12-18 ENCOUNTER — Other Ambulatory Visit: Payer: Self-pay | Admitting: Cardiology

## 2019-12-18 DIAGNOSIS — I5032 Chronic diastolic (congestive) heart failure: Secondary | ICD-10-CM

## 2019-12-18 DIAGNOSIS — I110 Hypertensive heart disease with heart failure: Secondary | ICD-10-CM

## 2019-12-18 DIAGNOSIS — I10 Essential (primary) hypertension: Secondary | ICD-10-CM

## 2019-12-18 DIAGNOSIS — N182 Chronic kidney disease, stage 2 (mild): Secondary | ICD-10-CM

## 2019-12-18 DIAGNOSIS — I5031 Acute diastolic (congestive) heart failure: Secondary | ICD-10-CM

## 2019-12-21 ENCOUNTER — Other Ambulatory Visit: Payer: Self-pay

## 2019-12-21 ENCOUNTER — Telehealth: Payer: Self-pay | Admitting: Cardiology

## 2019-12-21 DIAGNOSIS — N182 Chronic kidney disease, stage 2 (mild): Secondary | ICD-10-CM

## 2019-12-21 DIAGNOSIS — I5031 Acute diastolic (congestive) heart failure: Secondary | ICD-10-CM

## 2019-12-21 DIAGNOSIS — I5032 Chronic diastolic (congestive) heart failure: Secondary | ICD-10-CM

## 2019-12-21 DIAGNOSIS — I110 Hypertensive heart disease with heart failure: Secondary | ICD-10-CM

## 2019-12-21 DIAGNOSIS — I10 Essential (primary) hypertension: Secondary | ICD-10-CM

## 2019-12-21 MED ORDER — CARVEDILOL 25 MG PO TABS: ORAL_TABLET | ORAL | 0 refills | Status: DC

## 2019-12-21 MED ORDER — SPIRONOLACTONE 50 MG PO TABS: ORAL_TABLET | ORAL | 0 refills | Status: DC

## 2019-12-21 NOTE — Telephone Encounter (Signed)
Patient needs refills on carvedilol (COREG) 25 MG tablet and spironolactone (ALDACTONE) 50 MG tablet but needs to make an appt with Dr. Delton See to have future refills. She only wants to see Dr. Delton See and not an APP but there are no openings right now. She wants to know what she can do to get refills on this medication.

## 2019-12-21 NOTE — Telephone Encounter (Signed)
Pt is calling to schedule a remote visit with Dr. Meda Coffee or an APP tomorrow, for medication refills and one year follow-up.  Pt is overdue for one year follow-up.  Pt states she cannot do an OV due to starting a new job with [employer], and they will not allow her to get off for work at this time.  Pt states she can step away to do a virtual visit.  Scheduled the pt to do a video visit with Cecilie Kicks NP for tomorrow 4/13 at 1145.  Pt is aware she will get a call 15 mins prior to that time to go over her meds and obtain her BP/HR recordings.  Consent is noted in this file.  Pt verbalized understanding and agrees with this plan. Pt appreciative for all the assistance.     Patient Consent for Virtual Visit         Brittany Lopez has provided verbal consent on 12/21/2019 for a virtual visit (video or telephone).   CONSENT FOR VIRTUAL VISIT FOR:  Brittany Lopez  By participating in this virtual visit I agree to the following:  I hereby voluntarily request, consent and authorize [facility] and its employed or contracted physicians, Engineer, materials, nurse practitioners or other licensed health care professionals (the Practitioner), to provide me with telemedicine health care services (the "Services") as deemed necessary by the treating Practitioner. I acknowledge and consent to receive the Services by the Practitioner via telemedicine. I understand that the telemedicine visit will involve communicating with the Practitioner through live audiovisual communication technology and the disclosure of certain medical information by electronic transmission. I acknowledge that I have been given the opportunity to request an in-person assessment or other available alternative prior to the telemedicine visit and am voluntarily participating in the telemedicine visit.  I understand that I have the right to withhold or withdraw my consent to the use of telemedicine in the course of my care at any  time, without affecting my right to future care or treatment, and that the Practitioner or I may terminate the telemedicine visit at any time. I understand that I have the right to inspect all information obtained and/or recorded in the course of the telemedicine visit and may receive copies of available information for a reasonable fee.  I understand that some of the potential risks of receiving the Services via telemedicine include:  Marland Kitchen Delay or interruption in medical evaluation due to technological equipment failure or disruption; . Information transmitted may not be sufficient (e.g. poor resolution of images) to allow for appropriate medical decision making by the Practitioner; and/or  . In rare instances, security protocols could fail, causing a breach of personal health information.  Furthermore, I acknowledge that it is my responsibility to provide information about my medical history, conditions and care that is complete and accurate to the best of my ability. I acknowledge that Practitioner's advice, recommendations, and/or decision may be based on factors not within their control, such as incomplete or inaccurate data provided by me or distortions of diagnostic images or specimens that may result from electronic transmissions. I understand that the practice of medicine is not an exact science and that Practitioner makes no warranties or guarantees regarding treatment outcomes. I acknowledge that a copy of this consent can be made available to me via my patient portal ([facility]), or I can request a printed copy by calling the office of [facility].    I understand that my insurance will  be billed for this visit.   I have read or had this consent read to me. . I understand the contents of this consent, which adequately explains the benefits and risks of the Services being provided via telemedicine.  . I have been provided ample opportunity to ask questions regarding this consent and  the Services and have had my questions answered to my satisfaction. . I give my informed consent for the services to be provided through the use of telemedicine in my medical care

## 2019-12-22 ENCOUNTER — Encounter: Payer: Self-pay | Admitting: Cardiology

## 2019-12-22 ENCOUNTER — Telehealth (INDEPENDENT_AMBULATORY_CARE_PROVIDER_SITE_OTHER): Payer: BLUE CROSS/BLUE SHIELD | Admitting: Cardiology

## 2019-12-22 ENCOUNTER — Other Ambulatory Visit: Payer: Self-pay

## 2019-12-22 VITALS — BP 137/81 | HR 82 | Ht 62.0 in | Wt 290.0 lb

## 2019-12-22 DIAGNOSIS — D508 Other iron deficiency anemias: Secondary | ICD-10-CM | POA: Diagnosis not present

## 2019-12-22 DIAGNOSIS — I5032 Chronic diastolic (congestive) heart failure: Secondary | ICD-10-CM

## 2019-12-22 DIAGNOSIS — I130 Hypertensive heart and chronic kidney disease with heart failure and stage 1 through stage 4 chronic kidney disease, or unspecified chronic kidney disease: Secondary | ICD-10-CM

## 2019-12-22 DIAGNOSIS — N182 Chronic kidney disease, stage 2 (mild): Secondary | ICD-10-CM | POA: Diagnosis not present

## 2019-12-22 DIAGNOSIS — I10 Essential (primary) hypertension: Secondary | ICD-10-CM

## 2019-12-22 MED ORDER — FUROSEMIDE 20 MG PO TABS: ORAL_TABLET | ORAL | 3 refills | Status: DC

## 2019-12-22 MED ORDER — HYDRALAZINE HCL 25 MG PO TABS: 25.0000 mg | ORAL_TABLET | Freq: Three times a day (TID) | ORAL | 3 refills | Status: DC

## 2019-12-22 MED ORDER — CARVEDILOL 25 MG PO TABS: ORAL_TABLET | ORAL | 0 refills | Status: AC

## 2019-12-22 MED ORDER — AMLODIPINE BESYLATE 10 MG PO TABS: ORAL_TABLET | ORAL | 3 refills | Status: DC

## 2019-12-22 MED ORDER — LOSARTAN POTASSIUM 100 MG PO TABS: 100.0000 mg | ORAL_TABLET | Freq: Every day | ORAL | 3 refills | Status: AC

## 2019-12-22 MED ORDER — SPIRONOLACTONE 50 MG PO TABS: ORAL_TABLET | ORAL | 3 refills | Status: AC

## 2019-12-22 NOTE — Patient Instructions (Signed)
Medication Instructions:  Your physician recommends that you continue on your current medications as directed. Please refer to the Current Medication list given to you today.  Labwork: None ordered.  Testing/Procedures: None ordered.  Follow-Up: Your physician recommends that you schedule a follow-up appointment in:   Dr Delton See in 1 year.    Any Other Special Instructions Will Be Listed Below (If Applicable).     If you need a refill on your cardiac medications before your next appointment, please call your pharmacy.

## 2019-12-22 NOTE — Progress Notes (Signed)
Virtual Visit via Video Note   This visit type was conducted due to national recommendations for restrictions regarding the COVID-19 Pandemic (e.g. social distancing) in an effort to limit this patient's exposure and mitigate transmission in our community.  Due to her co-morbid illnesses, this patient is at least at moderate risk for complications without adequate follow up.  This format is felt to be most appropriate for this patient at this time.  All issues noted in this document were discussed and addressed.  A limited physical exam was performed with this format.  Please refer to the patient's chart for her consent to telehealth for [facility].   The patient was identified using 2 identifiers.  Date:  12/22/2019   ID:  Brittany Lopez, DOB October 14, 1975, MRN [id number]  Patient Location: Other:  work Provider Location: Office  PCP:  Myles Lipps, MD  Cardiologist:  Tobias Alexander, MD  Electrophysiologist:  None   Evaluation Performed:  Follow-Up Visit  Chief Complaint:  HTN   History of Present Illness:    Brittany Lopez is a 44 y.o. female with history of hypertension, DM, morbid obesity, chronic diastolic CHF, CKD, sickle cell trait.  She has had difficult to control hypertension. 2D echo 2015 LVEF 50 to 55% with mild LVH, grade 1 DD.  12/17/18  Patient is being seen for 6 months follow up. Recent URI - sinusitis like, no fever, cough. She has been experiencing epigastric pains radiating to her back if without food for prolonged time. No chest pain, SOB, DOE, LE edema. She has been complaint with her meds, no side effects, no dizziness. BP lately in 110-120 mmHg range.  Today no chest pain and no SOB, Her BP has been stable.and last night 133/81 she has not had COVID and does not plan to have vaccine.  She has seen PCP but not yet had labs.  She tries to eat healthy with trulicity she has decreased appetite - has been difficult to exercise with COVIF plan to begin,  she has an adult tricycle to ride.  She and her daughter and husband plan to ride.   The patient does not have symptoms concerning for COVID-19 infection (fever, chills, cough, or new shortness of breath).    Past Medical History:  Diagnosis Date  . Chronic diastolic CHF (congestive heart failure) (HCC)   . CKD (chronic kidney disease), stage II   . Diabetes mellitus without complication (HCC)   . Hyperlipidemia   . Hypertension   . Hypertensive heart disease with CHF (congestive heart failure) (HCC) 01/08/2017  . Microcytic anemia   . Morbid obesity (HCC)   . Sickle cell trait [facility])    Past Surgical History:  Procedure Laterality Date  . CESAREAN SECTION       Current Meds  Medication Sig  . amLODipine (NORVASC) 10 MG tablet TAKE 1 TABLET(10 MG) BY MOUTH DAILY  . carvedilol (COREG) 25 MG tablet TAKE 1 TABLET(25 MG) BY MOUTH TWICE DAILY WITH A MEAL. Please make overdue appt with Dr. Delton See before anymore refills.2nd attempt  . folic acid (FOLVITE) 1 MG tablet Take 1 tablet (1 mg total) by mouth daily.  . furosemide (LASIX) 20 MG tablet TAKE 1 TABLET BY MOUTH DAILY.(  . hydrALAZINE (APRESOLINE) 25 MG tablet Take 1 tablet (25 mg total) by mouth 3 (three) times daily.  Marland Kitchen losartan (COZAAR) 100 MG tablet Take 1 tablet (100 mg total) by mouth daily.  Marland Kitchen spironolactone (ALDACTONE) 50 MG tablet TAKE 1  TABLET(50 MG) BY MOUTH DAILY. Please make overdue appt with Dr. Delton See before anymore refills. 2nd attempt  . [DISCONTINUED] amLODipine (NORVASC) 10 MG tablet TAKE 1 TABLET(10 MG) BY MOUTH DAILY  . [DISCONTINUED] carvedilol (COREG) 25 MG tablet TAKE 1 TABLET(25 MG) BY MOUTH TWICE DAILY WITH A MEAL. Please make overdue appt with Dr. Delton See before anymore refills.2nd attempt  . [DISCONTINUED] furosemide (LASIX) 20 MG tablet TAKE 1 TABLET BY MOUTH DAILY.(  . [DISCONTINUED] hydrALAZINE (APRESOLINE) 25 MG tablet Take 1 tablet (25 mg total) by mouth 3 (three) times daily.  . [DISCONTINUED] losartan  (COZAAR) 100 MG tablet Take 1 tablet (100 mg total) by mouth daily. Please make overdue follow up appt for further refills [phone] 1st attempt  . [DISCONTINUED] spironolactone (ALDACTONE) 50 MG tablet TAKE 1 TABLET(50 MG) BY MOUTH DAILY. Please make overdue appt with Dr. Delton See before anymore refills. 2nd attempt     Allergies:   Patient has no known allergies.   Social History   Tobacco Use  . Smoking status: Never Smoker  . Smokeless tobacco: Never Used  Substance Use Topics  . Alcohol use: Yes    Comment: socially, 1x month  . Drug use: No     Family Hx: The patient's family history includes Diabetes in her mother; Heart disease in her mother; Hypertension in her mother; Sjogren's syndrome in her mother; Stroke in her mother.  ROS:   Please see the history of present illness.    General:no colds or fevers, no weight changes Skin:no rashes or ulcers HEENT:no blurred vision, no congestion CV:see HPI PUL:see HPI GI:no diarrhea constipation or melena, no indigestion GU:no hematuria, no dysuria MS:no joint pain, no claudication Neuro:no syncope, no lightheadedness Endo:+ diabetes, no thyroid disease Heme Hx of anemia to have labs   All other systems reviewed and are negative.   Prior CV studies:   The following studies were reviewed today:  Echo 05/31/14 Study Conclusions   - Left ventricle: The cavity size was normal. Wall thickness was  increased in a pattern of mild LVH. Systolic function was normal.  The estimated ejection fraction was in the range of 50% to 55%.  Cannot exclude hypokinesis of the basalinferior myocardium.  Doppler parameters are consistent with abnormal left ventricular  relaxation (grade 1 diastolic dysfunction).  - Aortic valve: Probably trileaflet. Appears to be an area of  moderate calcification involving the noncoronary or left coronary  cusp and adjacent annulus. Not well seen, cannot completely  exclude a calcified  vegetation or mass. This could be further  visualized by TEE, if felt to be clinically appropriate. Cusp  separation was normal. There was no significant regurgitation.  - Left atrium: The atrium was at the upper limits of normal in  size.  - Right atrium: Central venous pressure (est): 3 mm Hg.  - Tricuspid valve: There was trivial regurgitation.  - Pulmonary arteries: Systolic pressure could not be accurately  estimated.  - Pericardium, extracardiac: There was no pericardial effusion.   Impressions:   - Limited images overall. Mild LVH with LVEF approximately 50-55%,  cannot exclude inferior hypokinesis, grade 1 diastolic  dysfunction. Appears to be an area of moderate calcification  involving the noncoronary or left coronary cusp and adjacent  annulus. Not well seen, cannot completely exclude a calcified  vegetation or mass. This could be further visualized by TEE, if  felt to be clinically appropriate. Trivial tricuspid  regurgitation, unable to assess PASP.   Transthoracic echocardiography. M-mode, complete 2D, spectral  Doppler, and color Doppler. Birthdate: Patient birthdate:  May 29, 1976. Age: Patient is 44 yr old. Sex: Gender: female.  BMI: 54.9 kg/m^2. Blood pressure:   192/107 Patient status:  Inpatient. Study date: Study date: 05/31/2014. Study time: 10:02  AM. Location: ICU/CCU     Labs/Other Tests and Data Reviewed:    EKG:  No ECG reviewed.  Recent Labs: No results found for requested labs within last 8760 hours.   Recent Lipid Panel Lab Results  Component Value Date/Time   CHOL 146 06/20/2018 12:05 PM   TRIG 62 06/20/2018 12:05 PM   HDL 42 06/20/2018 12:05 PM   CHOLHDL 3.5 06/20/2018 12:05 PM   CHOLHDL 3.8 05/24/2010 04:40 AM   LDLCALC 92 06/20/2018 12:05 PM    Wt Readings from Last 3 Encounters:  12/22/19 290 lb (131.5 kg)  12/17/18 294 lb (133.4 kg)  06/20/18 281 lb 3.2 oz (127.6 kg)     Objective:    Vital  Signs:  BP 137/81   Pulse 82   Ht 5\' 2"  (1.575 m)   Wt 290 lb (131.5 kg)   BMI 53.04 kg/m    VITAL SIGNS:  reviewed General NAD Pulmonary speaks in complete sentences without SOB  Neuro A&O X 3 moves upper ext without problem Psych pleasant affect   ASSESSMENT & PLAN:    1. HTN controled 2. Chronic diastolic HF euvolemic 3. CKD -3 stable but to have labs in near future.  4. Anemia followed by PCP to have labs   COVID-19 Education: The signs and symptoms of COVID-19 were discussed with the patient and how to seek care for testing (follow up with PCP or arrange E-visit).  The importance of social distancing was discussed today.  Time:   Today, I have spent 6 minutes with the patient with telehealth technology discussing the above problems.     Medication Adjustments/Labs and Tests Ordered: Current medicines are reviewed at length with the patient today.  Concerns regarding medicines are outlined above.   Tests Ordered: No orders of the defined types were placed in this encounter.   Medication Changes: Meds ordered this encounter  Medications  . amLODipine (NORVASC) 10 MG tablet    Sig: TAKE 1 TABLET(10 MG) BY MOUTH DAILY    Dispense:  90 tablet    Refill:  3  . carvedilol (COREG) 25 MG tablet    Sig: TAKE 1 TABLET(25 MG) BY MOUTH TWICE DAILY WITH A MEAL. Please make overdue appt with Dr. Meda Coffee before anymore refills.2nd attempt    Dispense:  30 tablet    Refill:  0    Please call our office to schedule an overdue appointment with Dr. Meda Coffee before anymore refills. [phone]. Thank you 2nd attempt  . furosemide (LASIX) 20 MG tablet    Sig: TAKE 1 TABLET BY MOUTH DAILY.(    Dispense:  90 tablet    Refill:  3  . hydrALAZINE (APRESOLINE) 25 MG tablet    Sig: Take 1 tablet (25 mg total) by mouth 3 (three) times daily.    Dispense:  270 tablet    Refill:  3  . losartan (COZAAR) 100 MG tablet    Sig: Take 1 tablet (100 mg total) by mouth daily.    Dispense:  90  tablet    Refill:  3    **Patient requests 90 days supply**  . spironolactone (ALDACTONE) 50 MG tablet    Sig: TAKE 1 TABLET(50 MG) BY MOUTH DAILY. Please make overdue appt with Dr. Meda Coffee before  anymore refills. 2nd attempt    Dispense:  90 tablet    Refill:  3    Follow Up:  In Person in 1 year(s)  Signed, Nada Boozer, NP  12/22/2019 2:40 PM    [facility]

## 2020-01-09 ENCOUNTER — Other Ambulatory Visit: Payer: Self-pay | Admitting: Cardiology

## 2020-01-09 DIAGNOSIS — I5032 Chronic diastolic (congestive) heart failure: Secondary | ICD-10-CM

## 2020-01-09 DIAGNOSIS — I10 Essential (primary) hypertension: Secondary | ICD-10-CM

## 2020-01-09 DIAGNOSIS — N182 Chronic kidney disease, stage 2 (mild): Secondary | ICD-10-CM

## 2020-01-28 ENCOUNTER — Other Ambulatory Visit: Payer: Self-pay | Admitting: Cardiology

## 2020-01-28 DIAGNOSIS — I10 Essential (primary) hypertension: Secondary | ICD-10-CM

## 2020-01-28 DIAGNOSIS — I5032 Chronic diastolic (congestive) heart failure: Secondary | ICD-10-CM

## 2020-01-28 DIAGNOSIS — N182 Chronic kidney disease, stage 2 (mild): Secondary | ICD-10-CM

## 2020-03-14 ENCOUNTER — Other Ambulatory Visit: Payer: Self-pay | Admitting: Cardiology

## 2020-03-14 DIAGNOSIS — N182 Chronic kidney disease, stage 2 (mild): Secondary | ICD-10-CM

## 2020-03-14 DIAGNOSIS — I10 Essential (primary) hypertension: Secondary | ICD-10-CM

## 2020-03-14 DIAGNOSIS — I5032 Chronic diastolic (congestive) heart failure: Secondary | ICD-10-CM

## 2020-04-07 ENCOUNTER — Other Ambulatory Visit: Payer: Self-pay | Admitting: Family Medicine

## 2020-04-07 DIAGNOSIS — E1165 Type 2 diabetes mellitus with hyperglycemia: Secondary | ICD-10-CM

## 2020-04-07 DIAGNOSIS — Z794 Long term (current) use of insulin: Secondary | ICD-10-CM

## 2021-01-17 ENCOUNTER — Other Ambulatory Visit: Payer: Self-pay | Admitting: Cardiology

## 2021-01-17 DIAGNOSIS — N182 Chronic kidney disease, stage 2 (mild): Secondary | ICD-10-CM

## 2021-01-17 DIAGNOSIS — I10 Essential (primary) hypertension: Secondary | ICD-10-CM

## 2021-01-17 DIAGNOSIS — I5032 Chronic diastolic (congestive) heart failure: Secondary | ICD-10-CM

## 2021-03-09 ENCOUNTER — Other Ambulatory Visit: Payer: Self-pay | Admitting: Cardiology

## 2021-03-09 DIAGNOSIS — I10 Essential (primary) hypertension: Secondary | ICD-10-CM

## 2021-03-09 DIAGNOSIS — I5032 Chronic diastolic (congestive) heart failure: Secondary | ICD-10-CM

## 2021-03-09 DIAGNOSIS — N182 Chronic kidney disease, stage 2 (mild): Secondary | ICD-10-CM

## 2021-04-02 ENCOUNTER — Other Ambulatory Visit: Payer: Self-pay | Admitting: Cardiology

## 2021-04-02 DIAGNOSIS — I10 Essential (primary) hypertension: Secondary | ICD-10-CM

## 2021-04-02 DIAGNOSIS — I5032 Chronic diastolic (congestive) heart failure: Secondary | ICD-10-CM

## 2021-04-02 DIAGNOSIS — N182 Chronic kidney disease, stage 2 (mild): Secondary | ICD-10-CM

## 2021-04-09 ENCOUNTER — Other Ambulatory Visit: Payer: Self-pay | Admitting: Cardiology

## 2021-04-09 DIAGNOSIS — I10 Essential (primary) hypertension: Secondary | ICD-10-CM

## 2021-04-09 DIAGNOSIS — I5032 Chronic diastolic (congestive) heart failure: Secondary | ICD-10-CM

## 2021-04-09 DIAGNOSIS — N182 Chronic kidney disease, stage 2 (mild): Secondary | ICD-10-CM
